# Patient Record
Sex: Male | Born: 1955
Health system: Southern US, Community
[De-identification: ages and names within clinical notes are randomized; demographics above are authoritative.]

## PROBLEM LIST (undated history)

## (undated) DIAGNOSIS — S43006A Unspecified dislocation of unspecified shoulder joint, initial encounter: Secondary | ICD-10-CM

## (undated) DIAGNOSIS — I10 Essential (primary) hypertension: Secondary | ICD-10-CM

## (undated) DIAGNOSIS — M069 Rheumatoid arthritis, unspecified: Secondary | ICD-10-CM

## (undated) DIAGNOSIS — R011 Cardiac murmur, unspecified: Secondary | ICD-10-CM

## (undated) DIAGNOSIS — J189 Pneumonia, unspecified organism: Secondary | ICD-10-CM

## (undated) DIAGNOSIS — J45909 Unspecified asthma, uncomplicated: Secondary | ICD-10-CM

## (undated) DIAGNOSIS — J302 Other seasonal allergic rhinitis: Secondary | ICD-10-CM

## (undated) DIAGNOSIS — F32A Depression, unspecified: Secondary | ICD-10-CM

## (undated) DIAGNOSIS — K219 Gastro-esophageal reflux disease without esophagitis: Secondary | ICD-10-CM

## (undated) DIAGNOSIS — J449 Chronic obstructive pulmonary disease, unspecified: Secondary | ICD-10-CM

## (undated) DIAGNOSIS — R0600 Dyspnea, unspecified: Secondary | ICD-10-CM

## (undated) DIAGNOSIS — F329 Major depressive disorder, single episode, unspecified: Secondary | ICD-10-CM

## (undated) DIAGNOSIS — K5792 Diverticulitis of intestine, part unspecified, without perforation or abscess without bleeding: Secondary | ICD-10-CM

## (undated) HISTORY — PX: TOTAL KNEE ARTHROPLASTY: SHX125

## (undated) HISTORY — PX: CARPAL TUNNEL RELEASE: SHX101

## (undated) HISTORY — PX: OTHER SURGICAL HISTORY: SHX169

---

## 2000-03-25 ENCOUNTER — Encounter: Payer: Self-pay | Admitting: *Deleted

## 2000-03-25 ENCOUNTER — Emergency Department (HOSPITAL_COMMUNITY): Admission: EM | Admit: 2000-03-25 | Discharge: 2000-03-25 | Payer: Self-pay | Admitting: *Deleted

## 2000-05-24 HISTORY — PX: TOTAL KNEE ARTHROPLASTY: SHX125

## 2003-04-10 ENCOUNTER — Inpatient Hospital Stay (HOSPITAL_COMMUNITY): Admission: RE | Admit: 2003-04-10 | Discharge: 2003-04-13 | Payer: Self-pay | Admitting: Orthopedic Surgery

## 2003-07-08 ENCOUNTER — Ambulatory Visit (HOSPITAL_BASED_OUTPATIENT_CLINIC_OR_DEPARTMENT_OTHER): Admission: RE | Admit: 2003-07-08 | Discharge: 2003-07-08 | Payer: Self-pay | Admitting: Orthopedic Surgery

## 2003-07-08 ENCOUNTER — Ambulatory Visit (HOSPITAL_COMMUNITY): Admission: RE | Admit: 2003-07-08 | Discharge: 2003-07-08 | Payer: Self-pay | Admitting: Orthopedic Surgery

## 2004-08-31 ENCOUNTER — Ambulatory Visit: Payer: Self-pay | Admitting: Internal Medicine

## 2004-09-16 ENCOUNTER — Ambulatory Visit: Payer: Self-pay

## 2009-02-03 ENCOUNTER — Inpatient Hospital Stay (HOSPITAL_COMMUNITY): Admission: RE | Admit: 2009-02-03 | Discharge: 2009-02-05 | Payer: Self-pay | Admitting: Orthopedic Surgery

## 2009-07-21 ENCOUNTER — Telehealth: Payer: Self-pay | Admitting: Family

## 2009-07-21 ENCOUNTER — Ambulatory Visit: Payer: Self-pay | Admitting: Diagnostic Radiology

## 2009-07-21 ENCOUNTER — Ambulatory Visit: Payer: Self-pay | Admitting: Family

## 2009-07-21 ENCOUNTER — Ambulatory Visit (HOSPITAL_BASED_OUTPATIENT_CLINIC_OR_DEPARTMENT_OTHER): Admission: RE | Admit: 2009-07-21 | Discharge: 2009-07-21 | Payer: Self-pay | Admitting: Internal Medicine

## 2009-07-21 DIAGNOSIS — J4540 Moderate persistent asthma, uncomplicated: Secondary | ICD-10-CM | POA: Insufficient documentation

## 2009-07-28 ENCOUNTER — Ambulatory Visit: Payer: Self-pay | Admitting: Family

## 2009-08-18 ENCOUNTER — Ambulatory Visit: Payer: Self-pay | Admitting: Diagnostic Radiology

## 2009-08-18 ENCOUNTER — Ambulatory Visit (HOSPITAL_BASED_OUTPATIENT_CLINIC_OR_DEPARTMENT_OTHER): Admission: RE | Admit: 2009-08-18 | Discharge: 2009-08-18 | Payer: Self-pay | Admitting: Internal Medicine

## 2009-10-30 ENCOUNTER — Telehealth: Payer: Self-pay | Admitting: Family

## 2009-11-11 ENCOUNTER — Ambulatory Visit: Payer: Self-pay | Admitting: Family

## 2009-11-11 DIAGNOSIS — R011 Cardiac murmur, unspecified: Secondary | ICD-10-CM | POA: Insufficient documentation

## 2009-11-11 DIAGNOSIS — L989 Disorder of the skin and subcutaneous tissue, unspecified: Secondary | ICD-10-CM | POA: Insufficient documentation

## 2009-11-11 DIAGNOSIS — I498 Other specified cardiac arrhythmias: Secondary | ICD-10-CM

## 2009-11-13 ENCOUNTER — Encounter: Payer: Self-pay | Admitting: Family

## 2009-11-13 DIAGNOSIS — M1990 Unspecified osteoarthritis, unspecified site: Secondary | ICD-10-CM | POA: Insufficient documentation

## 2009-11-14 ENCOUNTER — Encounter (INDEPENDENT_AMBULATORY_CARE_PROVIDER_SITE_OTHER): Payer: Self-pay | Admitting: *Deleted

## 2009-11-21 ENCOUNTER — Encounter: Payer: Self-pay | Admitting: Family

## 2009-12-08 ENCOUNTER — Encounter: Payer: Self-pay | Admitting: Family

## 2010-02-12 ENCOUNTER — Encounter: Payer: Self-pay | Admitting: Family

## 2010-02-25 ENCOUNTER — Telehealth: Payer: Self-pay | Admitting: Family

## 2010-02-25 DIAGNOSIS — I861 Scrotal varices: Secondary | ICD-10-CM | POA: Insufficient documentation

## 2010-02-25 DIAGNOSIS — N508 Other specified disorders of male genital organs: Secondary | ICD-10-CM | POA: Insufficient documentation

## 2010-02-27 ENCOUNTER — Telehealth: Payer: Self-pay | Admitting: Family

## 2010-04-10 ENCOUNTER — Telehealth: Payer: Self-pay | Admitting: Family

## 2010-06-13 ENCOUNTER — Encounter: Payer: Self-pay | Admitting: Internal Medicine

## 2010-06-23 NOTE — Miscellaneous (Signed)
Summary: Vaccine Record/[facility]  Vaccine Record/[facility]   Imported By: Lanelle Bal 12/08/2009 10:20:29  _____________________________________________________________________  External Attachment:    Type:   Image     Comment:   External Document

## 2010-06-23 NOTE — Miscellaneous (Signed)
Summary: Flu vaccine--2008  Clinical Lists Changes  Observations: Added new observation of FLU VAX: Historical (04/07/2007 10:53)      Immunization History:  Influenza Immunization History:    Influenza:  historical (04/07/2007)

## 2010-06-23 NOTE — Letter (Signed)
Summary: Generic Letter  [place] at [facility]  [address]   [place], [place] [address]   Phone: [phone]  Fax: [phone]    11/13/2009  Disability Determination Services [address] [place]. [address]  To Whom it may concern,  This letter is in reference to Mr. Austin Faulkner, DOB 2056/03/23.  Please see attached office note dated 11/11/2009 for complete history/examination, clinical findings, diagnosis, treatment/response and prognosis. He will return for fasting lab work.  We currently do not have any lab work on file. [name] suffers from a long history of degenerative joint disease in his knees and has had a total of 12 surgeries on his knees.  Both of his knees have been replaced, (most recently his left knee was replaced on 02/03/09).    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  He continues to have daily pain, swelling and stiffness in his knees which limits his ability to climb stairs, to stand or to walk for prolonged periods.  This physical impairment limits his ability to perform work-related sitting, standing, walking and lifting of objects.  He currently does not suffer from any signs of mental impairment.             Sincerely,   Sandford Craze FNP   Appended Document: Generic Letter Letter and 11/11/09 office noted mailed to pt per [facility] request.

## 2010-06-23 NOTE — Assessment & Plan Note (Signed)
Summary: follow up for his pneumonia--ph   Vital Signs:  Patient profile:   55 year old male Weight:      218.25 pounds BMI:     31.43 O2 Sat:      96 % on Room air Temp:     97.7 degrees F oral Pulse rate:   88 / minute Pulse rhythm:   regular Resp:     16 per minute BP sitting:   110 / 88  (right arm) Cuff size:   regular  Vitals Entered By: Mervin Kung CMA (July 28, 2009 8:41 AM)  O2 Flow:  Room air CC: room 17  follow up of pneumonia   CC:  room 17  follow up of pneumonia.  History of Present Illness: Austin Faulkner is a 55 year old male who presents today for follow up of his pneumonia.  Notes that he took his last dose of avelox yesterday.  Feeling better, denies fever, mucous which was originally yellow/green, but has now turned clear.  Still some SOB with exertion and does not feel that his energy has returned to normal.  No longer wheezing.  Only coughs if he is walking.    Allergies (verified): No Known Drug Allergies  Physical Exam  General:  Well-developed,well-nourished,in no acute distress; alert,appropriate and cooperative throughout examination Head:  Normocephalic and atraumatic without obvious abnormalities. No apparent alopecia or balding. Neck:  No deformities, masses, or tenderness noted. Lungs:  Normal respiratory effort, chest expands symmetrically. Lungs are clear to auscultation, no crackles or wheezes. Heart:  Normal rate and regular rhythm. S1 and S2 normal without gallop, murmur, click, rub or other extra sounds.   Impression & Recommendations:  Problem # 1:  PNEUMONIA, RIGHT (ICD-486) Assessment Improved Lung exam improved.  Clinically improved.  Completed antibiotics, afebrile.   Plan to repeat chest x-ray in 1 month to follow to clearing.  Pt instructed on follow up- see pt instructions.  Plan complete physical.   Clinically improved The following medications were removed from the medication list:    Avelox 400 Mg Tabs (Moxifloxacin hcl)  ..... One tablet by mouth daily x 7  Orders: CXR- 2view (CXR)  Complete Medication List: 1)  Asmanex 60 Metered Doses 220 Mcg/inh Aepb (Mometasone furoate) .... 2 puffs by mouth at bedtime 2)  Ibuprofen 800 Mg Tabs (Ibuprofen) .... Take 1 tablet every 8 hours as needed 3)  Hydrochlorothiazide 25 Mg Tabs (Hydrochlorothiazide) .... Take one by mouth every other day 4)  Omeprazole 20 Mg Cpdr (Omeprazole) .... Take 1 capsule by mouth once a day 5)  Loratadine 10 Mg Tabs (Loratadine) .... Take 1 tablet by mouth once a day 6)  Mucinex Dm Maximum Strength 60-1200 Mg Xr12h-tab (Dextromethorphan-guaifenesin) .... Take 1 tablet by mouth two times a day 7)  Proventil Hfa 108 (90 Base) Mcg/act Aers (Albuterol sulfate) .... 2 puffs as needed 8)  Daily Multi Tabs (Multiple vitamins-minerals) .... Take 1 tablet by mouth once a day  Patient Instructions: 1)  Please arrange a complete physical exam. 2)  Call if your energy or breathing does not continue to improve, if it worsens, or if you develop recurrent fever/chills. 3)  Complete follow up chest x-ray around March 28th.    Current Allergies (reviewed today): No known allergies

## 2010-06-23 NOTE — Progress Notes (Signed)
Summary: referral  Phone Note Outgoing Call   Call placed by: Lemont Fillers FNP,  April 10, 2010 4:31 PM Call placed to: Patient Summary of Call: Left message on patient's cell phone letting requesting call back.   Initial call taken by: Lemont Fillers FNP,  April 10, 2010 4:32 PM  Follow-up for Phone Call        Patient returned call, he will call back in afternoon, not available by phone Follow-up by: Darral Dash,  April 13, 2010 8:19 AM  Additional Follow-up for Phone Call Additional follow up Details #1::        Spoke with patient.  He will have ultrasound performed at the [facility] hospital during his upcoming apt.  Then will follow up with [place].  Requests that order be mailed to him, as he misplaced. Pls mail copy.  Additional Follow-up by: Lemont Fillers FNP,  April 13, 2010 1:26 PM    Additional Follow-up for Phone Call Additional follow up Details #2::    Copy has been mailed. Nicki Guadalajara Fergerson CMA Duncan Dull)  April 13, 2010 1:40 PM

## 2010-06-23 NOTE — Assessment & Plan Note (Signed)
Summary: CPX/DK   Vital Signs:  Patient profile:   55 year old male Height:      70 inches Weight:      213 pounds BMI:     30.67 O2 Sat:      98 % on Room air Temp:     98.1 degrees F oral Pulse rate:   52 / minute Pulse rhythm:   regular Resp:     18 per minute BP sitting:   108 / 70  (right arm) Cuff size:   large  Vitals Entered By: Glendell Docker CMA (November 11, 2009 9:53 AM)  O2 Flow:  Room air CC: Rm 4- Disability Evaluation Is Patient Diabetic? No Pain Assessment Patient in pain? no      Comments status post right knee replacement 01/2009, Paxil  40 once a day, medications reviewed   CC:  Rm 4- Disability Evaluation.  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  Patient notes that he injured his left knee 7/01 and right knee 05/1998.  Both knees have been replaced.  He reports that he filed for disability/workman's comp with each of these injuries.  Dr.  Thurston Hole replaced left knee 02/03/09.  Patient tried to return to work on limited duty, however he was told that they did not have any limited duty jobs available.  He notes that he continues with pain 6/10 in the mornings, improves with motrin and activity.  Feels limited in his ability to walk, used to play ping pong, difficulty climbing stairs.  Unable to stand or walk for prolonged periods. He also notes that his knee pain has negatively impacted his sex life.  Preventative- Rides bike 5 miles a day.  Diet is healthly has recently lost >10 pounds. He also follows at the [place].  Never had colonoscopy.    Preventive Screening-Counseling & Management  Alcohol-Tobacco     Alcohol drinks/day: <1     Smoking Status: never  Caffeine-Diet-Exercise     Caffeine use/day: 2 cups coffee daily     Does Patient Exercise: yes     Type of exercise: cycling     Times/week: 7  Allergies: No Known Drug Allergies  Past  History:  Past Medical History: DJD Asthma  Past Surgical History: 6 sugeries on right knee (scopes/and TKR) 6 surgeries on left knee  RTC repair left shoulder- 10/09/2002  Family History: Mom-deceased from lung cancer (smoker) died at age 22 Dad- living, healthy 2 sisters- livings- younger sister with HTN, OA, older sister alive and well 2 stepsons 1 biological daugher- alive and well, age 76  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 Resp: Denies cough, some ashtma symptoms with exercise CV:  Denies Chest Pain GI:  Denies nausea or vomitting or diarrhea GU: Denies dysuria  Lymphatic: Denies lymphadenopathy Musculoskeletal:  Bilateral knee pain Skin:  Denies Rashes, mole on back of right arm Psychiatric: Notes + history of depression, anxiety- improved with paxil- feels down at times due to his knee pain Neuro: notes some numbness on bilateral knees due to nerve damage  from his surgeries.     Physical Exam  General:  Well-developed,well-nourished,in no acute distress; alert,appropriate and cooperative throughout examination Head:  Normocephalic and atraumatic without obvious abnormalities. No apparent alopecia or balding. Eyes:  PERRLA Ears:  External ear exam shows no significant lesions or deformities.  Otoscopic examination reveals clear canals, tympanic membranes are intact bilaterally without bulging, retraction, inflammation or discharge. Hearing is grossly normal bilaterally. Mouth:  Oral mucosa and oropharynx without lesions or exudates.  Teeth in good repair. Neck:  No deformities, masses, or tenderness noted. Chest Wall:  No deformities, masses, tenderness or  gynecomastia noted. Lungs:  Normal respiratory effort, chest expands symmetrically. Lungs are clear to auscultation, no crackles or wheezes. Heart:  Normal rate and regular rhythm.  Grad 1-2 systolic murmur Abdomen:  Bowel sounds positive,abdomen soft and non-tender without masses, organomegaly or hernias noted. Rectal:  No external abnormalities noted. Normal sphincter tone. No rectal masses or tenderness. Genitalia:  + nodule at base of left testicle- firm Msk:  No deformity or scoliosis noted of thoracic or lumbar spine.   slight swelling of bilateral knees Neurologic:  No cranial nerve deficits noted. Station and gait are normal. Plantar reflexes are down-going bilaterally. DTRs are symmetrical throughout. Sensory, motor and coordinative functions appear intact. Skin:  Intact.  Hyperpigmented lesion noted on right upper arm.   Impression & Recommendations:  Problem # 1:  PREVENTIVE HEALTH CARE (ICD-V70.0) Assessment New Patient counseled on diet, exercise and weight loss.  Immunizations reviewed and up to date. EKG notes sinus bradycardia.  Will refer for colonoscopy. Patient reports that he recently had lab work at [place] completed which included PSA.  He has records at home and will provide [place] with copies. Patient comes today with paperwork regarding his disability claim. Orders: EKG w/ Interpretation (93[address]) Gastroenterology Referral (GI)  Problem # 2:  ASTHMA (ICD-493.90) Assessment: Deteriorated Not well controlled on Asmanex- will switch to Advair, continue proventil PRN His updated medication list for this problem includes:    Advair Diskus 250-50 Mcg/dose Aepb (Fluticasone-salmeterol) ..... One puff twice daily    Proventil Hfa 108 (90 Base) Mcg/act Aers (Albuterol sulfate) .Marland Kitchen... 2 puffs as needed  Problem # 3:  BRADYCARDIA (ICD-427.89) Assessment: Comment Only Asymptomatic.  EKG noted heart rate in the 40's.  Patient is not on a BB or rate contolling med.  No dizziness or  syncope.  Monitor.  Problem # 4:  SYSTOLIC MURMUR (ZOX-096.2) Assessment: Comment Only Patient reports that this is not new and that he has [facility] an [facility] [place]  Problem # 5:  SKIN LESION (ICD-709.9)  Orders: Dermatology Referral (Derma)  Complete Medication List: 1)  Advair Diskus 250-50 Mcg/dose Aepb (Fluticasone-salmeterol) .... One puff twice daily 2)  Ibuprofen 800 Mg Tabs (Ibuprofen) .... Take 1 tablet every 8 hours as needed 3)  Hydrochlorothiazide 25 Mg Tabs (Hydrochlorothiazide) .... Take one by mouth every other day as needed for swelling 4)  Omeprazole 20 Mg Cpdr (Omeprazole) .... Take 1 capsule by mouth once a day 5)  Loratadine 10 Mg Tabs (Loratadine) .... Take 1 tablet by mouth once a day 6)  Mucinex Dm Maximum Strength 60-1200 Mg Xr12h-tab (Dextromethorphan-guaifenesin) .... Take 1 tablet by mouth two times a day 7)  Proventil Hfa 108 (90 Base) Mcg/act Aers (Albuterol sulfate) .... 2 puffs as needed 8)  Daily Multi Tabs (Multiple vitamins-minerals) .... Take 1 tablet by mouth once a day  Patient Instructions: 1)  You will be contacted  about your referral to dermatology. 2)  Please obtain a copy of your most recent blood work and immunizations from the [place]. 3)  Stop asthmanex- start Advair. 4)  You will be called about your referral for your testicular ultrasound 5)  Please follow up in 3 months. Prescriptions: ADVAIR DISKUS 250-50 MCG/DOSE AEPB (FLUTICASONE-SALMETEROL) one puff twice daily  #1 x 3   Entered and Authorized by:   Austin Faulkner   Signed by:   Austin Faulkner on 11/11/2009   Method used:   Print then Give to Patient   RxID:   [id number]     Appended Document: CPX/DK     Allergies: No Known Drug Allergies   Impression & Recommendations:  Problem # 1:  DEGENERATIVE JOINT DISEASE (ICD-715.90) Assessment Unchanged Patient is limited in his ability to stand or walk for prolonged periods.   Stairs are difficult for him to climb.  Unable to kneel.  Has daily pain, stiffness and swelling of bilateral knees.  He is s/p bilateral knee replacements and continues to use NSAIDS for pain.  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   His updated medication list for this problem includes:    Ibuprofen 800 Mg Tabs (Ibuprofen) .Marland Kitchen... Take 1 tablet every 8 hours as needed  Complete Medication List: 1)  Advair Diskus 250-50 Mcg/dose Aepb (Fluticasone-salmeterol) .... One puff twice daily 2)  Ibuprofen 800 Mg Tabs (Ibuprofen) .... Take 1 tablet every 8 hours as needed 3)  Hydrochlorothiazide 25 Mg Tabs (Hydrochlorothiazide) .... Take one by mouth every other day as needed for swelling 4)  Omeprazole 20 Mg Cpdr (Omeprazole) .... Take 1 capsule by mouth once a day 5)  Loratadine 10 Mg Tabs (Loratadine) .... Take 1 tablet by mouth once a day 6)  Mucinex Dm Maximum Strength 60-1200 Mg Xr12h-tab (Dextromethorphan-guaifenesin) .... Take 1 tablet by mouth two times a day 7)  Proventil Hfa 108 (90 Base) Mcg/act Aers (Albuterol sulfate) .... 2 puffs as needed 8)  Daily Multi Tabs (Multiple vitamins-minerals) .... Take 1 tablet by mouth once a day

## 2010-06-23 NOTE — Progress Notes (Signed)
Summary: disability application--lm  Phone Note Outgoing Call   Summary of Call: Please call patient and let him know that I will need to see him in the office and get more information before I can consider recommending him for disability. He is due for a complete physical. Initial call taken by: Lemont Fillers FNP,  October 30, 2009 8:19 AM  Follow-up for Phone Call        Left message on machine to return my call.  Mervin Kung CMA  October 30, 2009 10:26 AM   Left message on machine to return my call.  Mervin Kung CMA  November 05, 2009 9:10 AM   Additional Follow-up for Phone Call Additional follow up Details #1::        call placed to patient at [phone], he has been advised per [facility] instructions. Appointment has been scheduled for 11/11/2009 @ 10a Additional Follow-up by: Glendell Docker CMA,  November 06, 2009 9:11 AM

## 2010-06-23 NOTE — Progress Notes (Signed)
  Phone Note Outgoing Call   Summary of Call: CXR notes RLL pneumonia- will treat with Avelox.  Called patient, informed him of results, instructed him to start abx tonight- continue inhaled steroid and albuterol (Q6 hours x next week).  Patient instructed to call if worsening symptoms, or shortness of breath.   Initial call taken by: Lemont Fillers FNP,  July 21, 2009 3:43 PM    New/Updated Medications: AVELOX 400 MG TABS (MOXIFLOXACIN HCL) one tablet by mouth daily x 7 days Prescriptions: AVELOX 400 MG TABS (MOXIFLOXACIN HCL) one tablet by mouth daily x 7 days  #7 x 0   Entered and Authorized by:   Lemont Fillers FNP   Signed by:   Lemont Fillers FNP on 07/21/2009   Method used:   Electronically to        [id number] (retail)       [address]       [place], [place]  [address]       Ph: [phone] or [phone]       Fax: [phone]   RxID:   [id number]

## 2010-06-23 NOTE — Letter (Signed)
Summary: Records Dated 12-05-97 thru 12-28-08/[facility]  Records Dated 12-05-97 thru 12-28-08/[facility]   Imported By: Lanelle Bal 12/08/2009 10:27:12  _____________________________________________________________________  External Attachment:    Type:   Image     Comment:   External Document

## 2010-06-23 NOTE — Miscellaneous (Signed)
Summary: immunization history  Clinical Lists Changes  Observations: Added new observation of PNEUMOVAX: Historical (04/07/2007 9:29) Added new observation of TD BOOSTER: Historical (05/24/1996 9:29)      Immunization History:  Tetanus/Td Immunization History:    Tetanus/Td:  historical (05/24/1996)  Pneumovax Immunization History:    Pneumovax:  historical (04/07/2007)

## 2010-06-23 NOTE — Progress Notes (Signed)
  Phone Note Outgoing Call      

## 2010-06-23 NOTE — Progress Notes (Signed)
  Phone Note Outgoing Call   Summary of Call: Reviewed patient's chart- testicular ultrasound was not completed.  Will order.  Message left on patient's cell phone informing him that ultrasound was to be scheduled and to call if he has any questions.  Initial call taken by: Lemont Fillers FNP,  February 25, 2010 11:08 AM  New Problems: TESTICULAR MASS, LEFT (ICD-608.89)   New Problems: TESTICULAR MASS, LEFT (ICD-608.89)   Preventive Care Screening     02/12/10 colonoscopy with Dr. Kinnie Scales (+polyp) path unavailable.  Appended Document:  Pt returned call and was notified that u/s had been arranged for today. Pt states he has all testing done at the [place] [facility]. and will be going back there in 2 weeks. Pt wants to wait until then to do the u/s. Wants to get order from [place] to take to the [place].  I advised pt he needed to have u/s before 2 weeks. Pt was questioning the urgency as he was last seen in June. Please advise.  Appended Document:  OK for patient to complete at the [place] in 2 weeks.   We will provide order for him.    Appended Document:  Left message for pt to return my call.  Appended Document:  Notified pt. He requests u/s order be mailed to his home. Order mailed.

## 2010-06-23 NOTE — Procedures (Signed)
Summary: Colonoscopic Snare Polypectomy/[facility]  Colonoscopic Snare Polypectomy/[facility]   Imported By: Lanelle Bal 03/03/2010 08:47:13  _____________________________________________________________________  External Attachment:    Type:   Image     Comment:   External Document

## 2010-06-23 NOTE — Progress Notes (Signed)
Summary: AVELOX NOT COVERED  Phone Note From Pharmacy   Caller: [facility] [address] [phone]* Summary of Call: Pharm called [place] office at 5:01pm: Avelox requires PA but Levaquin 750mg  is covered. Would prescriber change medication? Please inform pharmacy. Initial call taken by: Lamar Sprinkles, CMA,  July 21, 2009 5:52 PM  Follow-up for Phone Call        [facility] pharmacy and informed of med change.  Then called patient- he has already started avelox.  Will continue avelox.  Pharm notified not to fill levaquin  Follow-up by: Lemont Fillers FNP,  July 21, 2009 6:09 PM    New/Updated Medications: LEVAQUIN 750 MG TABS (LEVOFLOXACIN) one tablet by mouth daily x 5 days AVELOX 400 MG TABS (MOXIFLOXACIN HCL) one tablet by mouth daily x 7 Prescriptions: LEVAQUIN 750 MG TABS (LEVOFLOXACIN) one tablet by mouth daily x 5 days  #5 x 0   Entered and Authorized by:   Lemont Fillers FNP   Signed by:   Lemont Fillers FNP on 07/21/2009   Method used:   Electronically to        [facility] (retail)       [address]       [place], [place]  [address]       Ph: [phone] or [phone]       Fax: [phone]   RxID:   [id number]

## 2010-06-23 NOTE — Letter (Signed)
Summary: Primary Care Consult Scheduled Letter  [place] at [facility]  [address]   [place], [place] [address]   Phone: [phone]  Fax: [phone]      11/14/2009 MRN: [id number]  INDALECIO MALMSTROM [address], [place]  [address]    Dear Mr. VALLADOLID,      We have scheduled an appointment for you.  At the recommendation of  Melissa O'Sullivan,FNP, we have scheduled you a consult with [facility] , GARY ENGSTROM,PA  on AUGUST  12,2011 at 11:15AM.  Their address is__[address] ,[address], [place] . The office phone number is [phone].  If this appointment day and time is not convenient for you, please feel free to call the office of the doctor you are being referred to at the number listed above and reschedule the appointment.     It is important for you to keep your scheduled appointments. We are here to make sure you are given good patient care. If you have questions or you have made changes to your appointment, please notify us at  [phone], ask for HELEN.    Thank you, Darral Dash Patient Care Coordinator [place] at [facility]

## 2010-06-23 NOTE — Assessment & Plan Note (Signed)
Summary: ACUTE ONLY ASTHMA,LAST OV 2006 W/HOPPER/RH......   Vital Signs:  Patient profile:   55 year old male Height:      70 inches Weight:      212 pounds BMI:     30.53 O2 Sat:      98 % on Room air Temp:     98.3 degrees F oral Pulse rate:   64 / minute Pulse rhythm:   regular Resp:     16 per minute BP sitting:   120 / 84  (left arm) Cuff size:   regular  Vitals Entered By: Mervin Kung CMA (July 21, 2009 11:34 AM)  O2 Flow:  Room air CC: room 17  Asthma flare up   CC:  room 17  Asthma flare up.  History of Present Illness: Mr Yandell is a 55 year old male who presents today with c/o wheezing. Notes that 3-4 days ago developed mild loose stool, waves of nausea, and also some chest congestion.  Notes that these symptoms started two days ago.  It is associated with SOB, woke him up last night.  Notes some improvement with his albuterol.  Also notes + fatigue.  Patient notes low grade temp (sweats at night).  + cough- productive of light yellow mucous.    Allergies (verified): No Known Drug Allergies  Physical Exam  General:  Well-developed,well-nourished,in no acute distress; alert,appropriate and cooperative throughout examination Head:  Normocephalic and atraumatic without obvious abnormalities. No apparent alopecia or balding. Neck:  No deformities, masses, or tenderness noted. Lungs:  Soft R sided exp wheeze, RML and RLL crackles.  No increased WOB Heart:  Normal rate and regular rhythm. S1 and S2 normal without gallop, murmur, click, rub or other extra sounds.   Impression & Recommendations:  Problem # 1:  PNEUMONIA, RIGHT (ICD-486) Assessment New CXR notes right infrahilar air space disease.  Plan treatment with avelox, f/u 1 week, sooner if symptoms worsen His updated medication list for this problem includes:    Avelox 400 Mg Tabs (Moxifloxacin hcl) ..... One tablet by mouth daily x 7 days  Orders: T-2 View CXR (71020TC)  Problem # 2:  ASTHMA  (ICD-493.90) Assessment: Comment Only Patient with soft right sided expiratory wheeze. I am hesitant to add PO steroids in setting of acute PNA.  As wheezing is mild, and sats stable will continue inhaled steroid and standing albuterol for next 1 week.  Monitor.   His updated medication list for this problem includes:    Asmanex 60 Metered Doses 220 Mcg/inh Aepb (Mometasone furoate) .Marland Kitchen... 2 puffs by mouth at bedtime    Proventil Hfa 108 (90 Base) Mcg/act Aers (Albuterol sulfate) .Marland Kitchen... 2 puffs as needed  Complete Medication List: 1)  Asmanex 60 Metered Doses 220 Mcg/inh Aepb (Mometasone furoate) .... 2 puffs by mouth at bedtime 2)  Ibuprofen 800 Mg Tabs (Ibuprofen) .... Take 1 tablet every 8 hours as needed 3)  Hydrochlorothiazide 25 Mg Tabs (Hydrochlorothiazide) .... Take one by mouth every other day 4)  Omeprazole 20 Mg Cpdr (Omeprazole) .... Take 1 capsule by mouth once a day 5)  Loratadine 10 Mg Tabs (Loratadine) .... Take 1 tablet by mouth once a day 6)  Mucinex Dm Maximum Strength 60-1200 Mg Xr12h-tab (Dextromethorphan-guaifenesin) .... Take 1 tablet by mouth two times a day 7)  Proventil Hfa 108 (90 Base) Mcg/act Aers (Albuterol sulfate) .... 2 puffs as needed 8)  Daily Multi Tabs (Multiple vitamins-minerals) .... Take 1 tablet by mouth once a day 9)  Avelox 400 Mg Tabs (Moxifloxacin hcl) .... One tablet by mouth daily x 7 days  Patient Instructions: 1)  Your chest x-ray may be completed a the [place] office at [address] or at the [facility] in [place] at the corner of [address] and [address]. 2)  I will call you with your results 3)  Complete x ray this afternoon. 4)  Follow up in 1 week, sooner if increasing shortness of breath or fever over 101.  Go to ER if severe  Current Allergies (reviewed today): No known allergies

## 2010-07-03 ENCOUNTER — Ambulatory Visit (INDEPENDENT_AMBULATORY_CARE_PROVIDER_SITE_OTHER): Admitting: Family

## 2010-07-03 ENCOUNTER — Encounter: Payer: Self-pay | Admitting: Family

## 2010-07-03 DIAGNOSIS — L708 Other acne: Secondary | ICD-10-CM | POA: Insufficient documentation

## 2010-07-03 DIAGNOSIS — N508 Other specified disorders of male genital organs: Secondary | ICD-10-CM

## 2010-07-03 DIAGNOSIS — M549 Dorsalgia, unspecified: Secondary | ICD-10-CM | POA: Insufficient documentation

## 2010-07-09 NOTE — Assessment & Plan Note (Signed)
Summary: threw back out/ss--rm 5   Vital Signs:  Patient profile:   55 year old male Height:      70 inches Weight:      219.50 pounds BMI:     31.61 Temp:     98.3 degrees F oral Pulse rate:   66 / minute Pulse rhythm:   regular Resp:     16 per minute BP sitting:   140 / 94  (right arm) Cuff size:   large  Vitals Entered By: Mervin Kung CMA Duncan Dull) (July 03, 2010 11:07 AM) CC: Pt states he has had a catch in his back x 10day., Back Pain Is Patient Diabetic? No Pain Assessment Patient in pain? yes     Location: lower back Onset of pain  x 10 days, only hurts when he tries to stand from sitting position. Comments Pt no longer taking Mucinex.   CC:  Pt states he has had a catch in his back x 10day. and Back Pain.  History of Present Illness: Austin Faulkner is a 55 year old male who presents today with chief complaint of back pain. (see below)  2) Acne- Complains of facial acne, has tried noxema, clearasil without improvement.    3)Testicular ultrasound- has not completed at the [facility] as instructed.  Back Pain History:      The patient's back pain started approximately 06/24/2010.  The pain is located in the lower back region and does not radiate below the knees.  He states this is not work related.  On a scale of 1-10, he describes the pain as an 8.  He states that he has had a prior history of back pain.  The patient has not had any recent physical therapy for his back pain.  The following makes the back pain better: direct pressure.  The following makes the back pain worse: standing upright.        Description of injury in patient's own words:  Denies associated injury.        Other comments:  Has tried flexeril and motrin with some improvement.     Allergies (verified): No Known Drug Allergies  Past History:  Past Surgical History: Last updated: 11/11/2009 6 sugeries on right knee (scopes/and TKR) 6 surgeries on left knee  RTC repair left shoulder- 2004  Past  Medical History: DJD Asthma Pneumonia 07/21/09  Review of Systems       see HPI  Physical Exam  General:  Well-developed,well-nourished,in no acute distress; alert,appropriate and cooperative throughout examination Head:  Normocephalic and atraumatic without obvious abnormalities. No apparent alopecia or balding. Eyes:  PERRLA Neck:  No deformities, masses, or tenderness noted. Lungs:  Normal respiratory effort, chest expands symmetrically. Lungs are clear to auscultation, no crackles or wheezes. Heart:  Normal rate and regular rhythm. S1 and S2 normal without gallop, murmur, click, rub or other extra sounds. Neurologic:  strength normal in all extremities and gait normal.     Detailed Back/Spine Exam  Lumbosacral Exam:  Inspection-deformity:    Normal Palpation-spinal tenderness:  Normal    Noted to have several mobile non-tender massess most consistent with lipomas in the soft tissue of the lower back Lying Straight Leg Raise:    Right:  negative    Left:  negative   Impression & Recommendations:  Problem # 1:  BACK PAIN, ACUTE (ICD-724.5) Assessment New Will continue conservative measures (NSAIDS and as needed flexeril).  We did discuss risks of long-term NSAID use. Specifically risk of GI  bleed and kidney damage. Recommended that long-term he try to avoid NSAIDs and instead use Tylenol as needed. His updated medication list for this problem includes:    Ibuprofen 800 Mg Tabs (Ibuprofen) .Marland Kitchen... Take 1 tablet every 8 hours as needed    Flexeril 5 Mg Tabs (Cyclobenzaprine hcl) ..... One tab by mouth every 8 hours as needed for back spasm  Problem # 2:  TESTICULAR MASS, LEFT (ICD-608.89) Assessment: Comment Only the patient has been instructed on several occasions to complete this ultrasound. He has not done so. I offered to have this completed here at that center, but he refused as he prefers to do this at the [place]. He tells me that he still does have to order that we gave  him. Reminded patient of the importance of completing this to rule out any underlying malignancy. Informed him that delay in diagnosis could be dangerous to his health. He tells me that he will arrange within the next 2 weeks at the [place].  Problem # 3:  ACNE VULGARIS, FACIAL (ICD-706.1) Assessment: New we'll give patient trial of gel as below. His updated medication list for this problem includes:    Benzamycin 5-3 % Gel (Benzoyl peroxide-erythromycin) .Marland Kitchen... Apply to clean dry face once daily at bedtime  Complete Medication List: 1)  Advair Diskus 250-50 Mcg/dose Aepb (Fluticasone-salmeterol) .... One puff twice daily 2)  Ibuprofen 800 Mg Tabs (Ibuprofen) .... Take 1 tablet every 8 hours as needed 3)  Hydrochlorothiazide 25 Mg Tabs (Hydrochlorothiazide) .... Take one by mouth every other day as needed for swelling 4)  Omeprazole 20 Mg Cpdr (Omeprazole) .... Take 1 capsule by mouth once a day 5)  Loratadine 10 Mg Tabs (Loratadine) .... Take 1 tablet by mouth once a day 6)  Mucinex Dm Maximum Strength 60-1200 Mg Xr12h-tab (Dextromethorphan-guaifenesin) .... Take 1 tablet by mouth two times a day 7)  Proventil Hfa 108 (90 Base) Mcg/act Aers (Albuterol sulfate) .... 2 puffs as needed 8)  Daily Multi Tabs (Multiple vitamins-minerals) .... Take 1 tablet by mouth once a day 9)  Flexeril 5 Mg Tabs (Cyclobenzaprine hcl) .... One tab by mouth every 8 hours as needed for back spasm 10)  Amlodipine Besylate 5 Mg Tabs (Amlodipine besylate) .... One tablet by mouth once daily 11)  Benzamycin 5-3 % Gel (Benzoyl peroxide-erythromycin) .... Apply to clean dry face once daily at bedtime  Patient Instructions: 1)  Please arrange your testicular US ASAP at the [place].   2)  You may continue ibuprofen for the next 1 week, then try to use Tylenol 650mg  by mouth every 6 hours as needed for pain instead. 3)  Follow up in 1 month. Prescriptions: BENZAMYCIN 5-3 % GEL (BENZOYL PEROXIDE-ERYTHROMYCIN) Apply to clean dry face  once daily at bedtime  #1 x 1   Entered and Authorized by:   Lemont Fillers FNP   Signed by:   Lemont Fillers FNP on 07/03/2010   Method used:   Electronically to        [facility] (retail)       [address]       [place], [place]  [address]       Ph: [phone] or [phone]       Fax: [phone]   RxID:   [id number] AMLODIPINE BESYLATE 5 MG TABS (AMLODIPINE BESYLATE) one tablet by mouth once daily  #30 x 1   Entered and  Authorized by:   Lemont Fillers FNP   Signed by:   Lemont Fillers FNP on 07/03/2010   Method used:   Electronically to        [facility] (retail)       [address]       [place], [place]  [address]       Ph: [phone] or [phone]       Fax: [phone]   RxID:   [id number] FLEXERIL 5 MG TABS (CYCLOBENZAPRINE HCL) one tab by mouth every 8 hours as needed for back spasm  #30 x 0   Entered and Authorized by:   Lemont Fillers FNP   Signed by:   Lemont Fillers FNP on 07/03/2010   Method used:   Electronically to        [facility] (retail)       [address]       [place], [place]  [address]       Ph: [phone] or [phone]       Fax: [phone]   RxID:   [id number]    Orders Added: 1)  Est. Patient Level III [31517]    Current Allergies (reviewed today): No known allergies

## 2010-07-28 ENCOUNTER — Telehealth: Payer: Self-pay | Admitting: Family

## 2010-07-29 ENCOUNTER — Ambulatory Visit: Admitting: Family

## 2010-08-04 NOTE — Progress Notes (Signed)
Summary: appt  Phone Note Call from Patient   Caller: Patient Call For: Lemont Fillers FNP Summary of Call: pt canceled appt for tomorrow with [facility] but he wanted me to tell her that he is fine and he is following up with the [place] in april.  Initial call taken by: Elba Barman,  July 28, 2010 10:48 AM

## 2010-08-28 LAB — BASIC METABOLIC PANEL
BUN: 5 mg/dL — ABNORMAL LOW (ref 6–23)
CO2: 31 mEq/L (ref 19–32)
Calcium: 8.2 mg/dL — ABNORMAL LOW (ref 8.4–10.5)
Calcium: 8.5 mg/dL (ref 8.4–10.5)
Chloride: 96 mEq/L (ref 96–112)
Creatinine, Ser: 1.03 mg/dL (ref 0.4–1.5)
Creatinine, Ser: 1.04 mg/dL (ref 0.4–1.5)
GFR calc Af Amer: >60 mL/min (ref >60)

## 2010-08-28 LAB — URINALYSIS, ROUTINE W REFLEX MICROSCOPIC
Bilirubin Urine: NEGATIVE
Nitrite: NEGATIVE
Specific Gravity, Urine: 1.023 (ref 1.005–1.030)
Urobilinogen, UA: 0.2 mg/dL (ref 0.0–1.0)
pH: 5.5 (ref 5.0–8.0)

## 2010-08-28 LAB — CBC
HCT: 43.2 % (ref 39.0–52.0)
MCHC: 32.9 g/dL (ref 30.0–36.0)
MCHC: 33.7 g/dL (ref 30.0–36.0)
MCV: 93 fL (ref 78.0–100.0)
MCV: 93.5 fL (ref 78.0–100.0)
Platelets: 268 10*3/uL (ref 150–400)
Platelets: 327 10*3/uL (ref 150–400)
RBC: 3.84 MIL/uL — ABNORMAL LOW (ref 4.22–5.81)
RDW: 13.5 % (ref 11.5–15.5)
WBC: 13.8 10*3/uL — ABNORMAL HIGH (ref 4.0–10.5)
WBC: 14.6 10*3/uL — ABNORMAL HIGH (ref 4.0–10.5)

## 2010-08-28 LAB — DIFFERENTIAL
Basophils Absolute: 0 10*3/uL (ref 0.0–0.1)
Lymphocytes Relative: 19 % (ref 12–46)
Lymphs Abs: 2.1 10*3/uL (ref 0.7–4.0)
Monocytes Absolute: 0.9 10*3/uL (ref 0.1–1.0)
Monocytes Relative: 8 % (ref 3–12)
Neutro Abs: 8 10*3/uL — ABNORMAL HIGH (ref 1.7–7.7)

## 2010-08-28 LAB — URINE CULTURE: Colony Count: NO GROWTH

## 2010-08-28 LAB — TYPE AND SCREEN
ABO/RH(D): B POS
Antibody Screen: NEGATIVE

## 2010-08-28 LAB — COMPREHENSIVE METABOLIC PANEL
AST: 23 U/L (ref 0–37)
Albumin: 4.3 g/dL (ref 3.5–5.2)
BUN: 15 mg/dL (ref 6–23)
Calcium: 9.2 mg/dL (ref 8.4–10.5)
Creatinine, Ser: 1.1 mg/dL (ref 0.4–1.5)
GFR calc Af Amer: >60 mL/min (ref >60)
Total Bilirubin: 0.8 mg/dL (ref 0.3–1.2)
Total Protein: 7 g/dL (ref 6.0–8.3)

## 2010-08-28 LAB — APTT: aPTT: 26 seconds (ref 24–37)

## 2010-08-28 LAB — ABO/RH: ABO/RH(D): B POS

## 2010-10-09 NOTE — Op Note (Signed)
NAME:  Austin Faulkner, Austin Faulkner                        ACCOUNT NO.:  [id number]   MEDICAL RECORD NO.:  [id number]                   PATIENT TYPE:  INP   LOCATION:  5014                                 FACILITY:  [facility]   PHYSICIAN:  Elana Alm. Thurston Hole, M.D.              DATE OF BIRTH:  1956-01-02   DATE OF PROCEDURE:  04/10/2003  DATE OF DISCHARGE:                                 OPERATIVE REPORT   PREOPERATIVE DIAGNOSIS:  Posttraumatic degenerative joint disease.   POSTOPERATIVE DIAGNOSIS:  Posttraumatic degenerative joint disease.   OPERATION PERFORMED:  1. Right total knee replacement using [employer]ics Scorpio total knee system     with a #11 cemented femoral component, #9 cemented tibial component  with     12 mm polyethylene flex tibial spacer and 26 mm polyethylene cemented     patella.  2. Right knee lateral retinacular release.   SURGEON:  Elana Alm. Thurston Hole, M.D.   ASSISTANT:  Austin Faulkner, P.A.   ANESTHESIA:  General.   OPERATIVE TIME:  One hour and 30 minutes.   COMPLICATIONS:  None.   DESCRIPTION OF PROCEDURE:  Austin Faulkner was brought to the operating room on  April 10, 2003, placed on the operative table in supine position.  After  an adequate level of general anesthesia was obtained, his right knee was  examined under anesthesia.  Range of motion from -10 to 125 degrees with  significant varus deformity.  Knee stable to ligamentous exam with normal  patellar tracking. He had a Foley catheter placed under sterile conditions  and received Ancef 1g IV preoperatively for prophylaxis.  The right leg was  then prepped using sterile DuraPrep and draped using sterile technique.  The  leg was exsanguinated and a thigh tourniquet elevated 350 mm.  Initially,  through a 10 to 15 cm longitudinal incision based over the patella, initial  exposure was made.  The underlying subcutaneous tissues were incised in line  with the skin incision.  A median arthrotomy was performed  revealing an  excessive amount of normal-appearing joint fluid.  The articular surfaces  were inspected.  He had 30% grade 4 changes on both the medial and lateral  compartments and the rest grade 3 changes.  The patellofemoral joint showed  50 to 75% grade 3 chondromalacia.  Osteophytes were removed from the femoral  condyles and tibial plateaus.  The medial and lateral meniscal remnants were  removed as well as the anterior cruciate ligament.  An intramedullary drill  was then drilled up the femoral canal for placement of the distal femoral  cutting jig which was placed in the appropriate amount of rotation and a  distal 12 mm cut was made.  The distal femur was then sized and a #11 was  found to be the appropriate size and the #11 cutting jig was placed and then  these cuts were made.  The proximal tibia was then exposed.  The tibial  spines were removed with an oscillating saw.  Intramedullary drill, drilled  down the tibial canal for placement of the proximal tibial cutting jig which  was placed in the appropriate amount of rotation and a proximal 10 mm cut  was made.  After this was done the Scorpio PCL cutter was placed back on the  distal femur and these cuts were made.  After this was done, the #11 femoral  trial was placed.  A #9 tibial baseplate trial  was placed and with a 12 mm  polyethylene flexed tibial spacer, there was found to be excellent  restoration of normal alignment and excellent stability through full range  of motion.  The tibial base plate was then marked for rotation and the keel  cut was made.  After this was done, the patella was sized.  A 26 mm was  found to be the appropriate size and a recessed 10 mm x 26 mm cut was made  and three locking holes were placed.  After this was done it was felt that  all of the trial components were of excellent size, fit and stability.  The  jig was then removed and the knee was jet lavage irrigated with 3L of saline  solution  and then the proximal tibia was exposed and the #9 tibial base  plate with cement backing was hammered into position with an excellent fit  with excess cement being removed from around the edges.  The #11 femoral  component with cement backing was hammered into position also with an  excellent fit with excess cement being removed from around the edges.  The  12 mm polyethylene flexed tibial spacer was then locked on the tibial base  plate, the knee taken through a range of motion, 0 to 125 degrees with  excellent stability and no lift off of the baseplate and excellent  correction of the varus malalignment.  The 26 mm polyethylene cement backed  patella was then placed into its recessed  hole and locked in place with a  clamp.  After the cement hardened, patellofemoral tracking was evaluated.  There was still some significant lateral tracking and tightness and thus a  lateral retinacular release was carried out.  This significantly improved  patellar tracking to normal and decompressed the patellofemoral joint.  After this was done, it was felt that all the trial components were of  excellent size and stability.  The knee was further irrigated with  antibiotic solution and then the arthrotomy was closed with #1 Ethibond  suture over two medium Hemovac drains.  Subcutaneous tissues closed with 0  and 2-0 Vicryl.  Skin closed with skin staples.  Sterile dressings were  applied.  Hemovac injected with 0.25% Marcaine with epinephrine and clamped.  Tourniquet was released.  The patient then had a femoral nerve block placed  by anesthesia for postoperative pain control.  He was then awakened,  extubated and taken to recovery room in stable condition.  Sponge and needle  counts were correct times two at the end of this case.                                               Robert A. Thurston Hole, M.D.    RAW/MEDQ  D:  04/10/2003  T:  04/11/2003  Job:  [id number]

## 2010-10-09 NOTE — Discharge Summary (Signed)
NAME:  Austin Faulkner, Austin Faulkner                        ACCOUNT NO.:  [id number]   MEDICAL RECORD NO.:  [id number]                   PATIENT TYPE:  INP   LOCATION:  5014                                 FACILITY:  [facility]   PHYSICIAN:  Elana Alm. Thurston Hole, M.D.              DATE OF BIRTH:  1955-10-24   DATE OF ADMISSION:  04/10/2003  DATE OF DISCHARGE:  04/13/2003                                 DISCHARGE SUMMARY   ADMISSION DIAGNOSES:  1. End-stage degenerative joint disease right knee.  2. Posttraumatic degenerative joint disease right knee.  3. Asthma.  4. Reflux.   DISCHARGE DIAGNOSES:  1. End-stage degenerative joint disease right knee.  2. Posttraumatic degenerative joint disease right knee.  3. Asthma.  4. Reflux.   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  He has undergone an ACL  reconstruction as well as scopes on this knee.  Despite this, he continues  to have significant pain unrelieved by rest, anti-inflammatories, or  cortisone injections.  He understands the risks, benefits, and possible  complications of a right total knee replacement and is without question.   PROCEDURES IN-HOUSE:  On April 10, 2003 the patient underwent a right  total knee replacement by Molly Maduro A. Thurston Hole, M.D.  Postoperatively he  underwent a femoral nerve block by anesthesia.  Postop day #1 he was doing  well, vital signs were stable.  He had difficulty with pain control.  He was  taking Percocet and his PCA.  On postop day #1 his PCA was discontinued.  He  was started on OxyContin 20 mg one p.o. b.i.d. as well as OxyIR for  breakthrough pain.  Postop day #2 the patient continued to improve.  Pain  was under control with p.o. pain medicines.  He spiked a temp of 101.  Hemoglobin was 11.2, INR was 2.1.  Surgical wound was well approximated.  He  progressed very well with physical therapy.  Postop day #3 the patient was  afebrile and his hemoglobin was 10.7.  He was alert and oriented.  He was  metabolically stable.  He was ready to be discharged.  He was discharged to  home in stable condition with home health physical therapy, home health  nursing, a 3-in-one bedside commode, a tub bench, walker with wheels, a CPM  machine.  Home health nursing for Coumadin monitoring.  We will see him back  in the office two weeks from his surgery date.  He has been instructed to  call if he has increased pain, increased drainage, increased redness.  He is  weightbearing as tolerated, in stable condition, on a regular diet.      Kirstin Shepperson, P.A.                  Robert A. Thurston Hole, M.D.    KS/MEDQ  D:  05/01/2003  T:  05/01/2003  Job:  [id number]

## 2010-10-09 NOTE — Op Note (Signed)
NAME:  Austin Faulkner, Austin Faulkner                        ACCOUNT NO.:  [id number]   MEDICAL RECORD NO.:  [id number]                   PATIENT TYPE:  AMB   LOCATION:  DSC                                  FACILITY:  [facility]   PHYSICIAN:  Robert A. Thurston Hole, M.D.              DATE OF BIRTH:  Jan 23, 1956   DATE OF PROCEDURE:  07/08/2003  DATE OF DISCHARGE:                                 OPERATIVE REPORT   PREOPERATIVE DIAGNOSIS:  Right knee arthrofibrosis status post total knee  replacement.   POSTOPERATIVE DIAGNOSIS:  Right knee arthrofibrosis status post total knee  replacement.   PROCEDURE:  Right knee examination under anesthesia followed by manipulation  and cortisone injection.   SURGEON:  Elana Alm. Thurston Hole, M.D.   ASSISTANT:  Julien Girt, P.A.   ANESTHESIA:  General.   OPERATIVE TIME:  5 minutes.   COMPLICATIONS:  None.   INDICATIONS:  Austin Faulkner is as 55 year old gentleman who underwent a total  knee replacement three months ago.  He has had persistent pain with  arthrofibrosis that has not responded to conservative care and is now to  undergo manipulation and injection.   DESCRIPTION OF PROCEDURE:  Austin Faulkner was brought to the operating room on  July 08, 2003, after a femoral nerve block had been placed in the  holding room.  He was placed on the operating table in the supine position.  After being placed under general anesthesia.  Initial range of motion showed  0 to 90 degrees.  Gentle manipulation was carried out breaking up adhesions  and approving flexion to 125 degrees.  The knee remained stable ligamentous  exam with normal patella tracking.  After this was done then the knee was  sterilely injected with 80 mg of Depo-Medrol and 10 mL of 0.25% Marcaine  with epinephrine.  He also received Ancef 1 g IV preoperatively for  prophylaxis.  He was then awakened and taken to recovery in stable  condition.   FOLLOWUP CARE:  Austin Faulkner will be followed as an outpatient  Percocet for  pain and a CPM machine.  See him back in the office in one week for recheck  and followup with early aggressive physical therapy.                                               Robert A. Thurston Hole, M.D.    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

## 2010-11-30 ENCOUNTER — Telehealth: Payer: Self-pay | Admitting: *Deleted

## 2010-11-30 ENCOUNTER — Other Ambulatory Visit: Payer: Self-pay | Admitting: Family

## 2010-11-30 ENCOUNTER — Other Ambulatory Visit (HOSPITAL_BASED_OUTPATIENT_CLINIC_OR_DEPARTMENT_OTHER)

## 2010-11-30 DIAGNOSIS — N5089 Other specified disorders of the male genital organs: Secondary | ICD-10-CM

## 2010-11-30 NOTE — Telephone Encounter (Signed)
Pls let patient know that his [place] has been reordered.  Provide patient with number for imaging dept. He should be able to call them and set up appointment time.

## 2010-11-30 NOTE — Telephone Encounter (Signed)
Received call from pt stating he had contacted the [facility] Re: the need to have a testicular ultrasound that he was supposed to have for monitoring purposes. He states they sent [place] a letter of approval to have the ultrasound performed here at the [facility] and he just needs [place] to schedule the procedure. Advised pt that the approval from the [facility] Expired on 11/27/10 and he states that he spoke to them and they told him he did not need to worry about the expiration date, he could still schedule procedure and call them with a date/time. Please advise what we need to schedule for pt?

## 2010-11-30 NOTE — Telephone Encounter (Signed)
Contacted pt and provided him with number to imaging.

## 2010-12-02 ENCOUNTER — Ambulatory Visit (INDEPENDENT_AMBULATORY_CARE_PROVIDER_SITE_OTHER)
Admission: RE | Admit: 2010-12-02 | Discharge: 2010-12-02 | Disposition: A | Discharge status: Home / Self Care | Payer: Non-veteran care | Source: Ambulatory Visit | Attending: Family | Admitting: Family

## 2010-12-02 ENCOUNTER — Ambulatory Visit (HOSPITAL_BASED_OUTPATIENT_CLINIC_OR_DEPARTMENT_OTHER)
Admission: RE | Admit: 2010-12-02 | Discharge: 2010-12-02 | Disposition: A | Discharge status: Home / Self Care | Payer: Non-veteran care | Source: Ambulatory Visit | Attending: Family | Admitting: Family

## 2010-12-02 DIAGNOSIS — I861 Scrotal varices: Secondary | ICD-10-CM

## 2010-12-02 DIAGNOSIS — N5089 Other specified disorders of the male genital organs: Secondary | ICD-10-CM

## 2010-12-02 DIAGNOSIS — N508 Other specified disorders of male genital organs: Secondary | ICD-10-CM | POA: Insufficient documentation

## 2010-12-22 ENCOUNTER — Other Ambulatory Visit: Payer: Self-pay | Admitting: Family

## 2010-12-22 NOTE — Telephone Encounter (Signed)
Needs OV prior to refills pls.

## 2010-12-22 NOTE — Telephone Encounter (Signed)
Please advise if ok to refill Benzamycin gel and if so, number of refills.

## 2010-12-24 NOTE — Telephone Encounter (Signed)
Left message on machine to return my call. 

## 2010-12-24 NOTE — Telephone Encounter (Signed)
Advised pt of Melissa's instruction and pt states he has an appt with his dermatologist on 01/19/11 and will try to wait until then to get med refilled. States he will forward [place] a copy of his office visit from the [place] to put in his file once he gets a copy. Declined to scheduled appt at this time. Pt requested that I mail him a copy of his testicular u/s results. Copy mailed.

## 2011-01-18 ENCOUNTER — Telehealth: Payer: Self-pay | Admitting: *Deleted

## 2011-01-18 NOTE — Telephone Encounter (Signed)
Pt left message requesting copy of paperwork from the [place] authorizing a testicular ultrasound in July. Papers printed and left at front desk for pt to pick up. Left message on voicemail and to call if any questions.

## 2011-02-26 ENCOUNTER — Telehealth: Payer: Self-pay | Admitting: Family

## 2011-02-26 DIAGNOSIS — L709 Acne, unspecified: Secondary | ICD-10-CM

## 2011-02-26 NOTE — Telephone Encounter (Signed)
Patient states that he would like to be referred to a dermatologist.

## 2011-03-01 NOTE — Telephone Encounter (Signed)
Spoke to pt on Friday. He left information packet re: authorization from [place] to see a local dermatologist. Authorization dates are good from 02/09/11 through 05/24/11 per the [place]. Pt states he sees a dermatologist for bumps on his face. Please advise.

## 2011-03-01 NOTE — Telephone Encounter (Signed)
Patient called again for referral to dermatology. Patient states that he is on a deadline from the [facility] for referral.

## 2011-03-01 NOTE — Telephone Encounter (Signed)
Pls call pt and let him know that I have placed referral order for dermatology. He was due for follow up back in March.  Please have pt schedule a follow up visit.

## 2011-03-01 NOTE — Telephone Encounter (Signed)
Pt notified and scheduled f/u for 03/08/11 at 9:30am.

## 2011-03-08 ENCOUNTER — Ambulatory Visit: Payer: Non-veteran care | Admitting: Family

## 2012-02-11 ENCOUNTER — Telehealth: Payer: Self-pay | Admitting: Family

## 2012-02-11 NOTE — Telephone Encounter (Signed)
Called pt, reports 5-6 stools loose stools today.  Small amount of blood on tissue.  Recommended that he stay well hydrated, may use imodium prn.  Instructed pt to go to ER if worsening diarrhea or rectal bleeding worsens to the point that the toilet bowel is turning red.  Otherwise, instructed pt for follow up appointment next week. Pt verbalizes understanding.

## 2012-02-11 NOTE — Telephone Encounter (Signed)
Patient states that he has been to the bathroom about 7 times in the last 24 hrs. His stomach has been upset and when he wipes there is blood on the tissue. He would like to know if he should wait for an appointment for next week or go to Urgent Care.

## 2012-02-14 ENCOUNTER — Encounter: Payer: Self-pay | Admitting: Family

## 2012-02-14 ENCOUNTER — Ambulatory Visit (INDEPENDENT_AMBULATORY_CARE_PROVIDER_SITE_OTHER): Payer: Medicare Other | Admitting: Family

## 2012-02-14 VITALS — BP 140/86 | HR 55 | Temp 98.1°F | Resp 16 | Wt 216.1 lb

## 2012-02-14 DIAGNOSIS — K644 Residual hemorrhoidal skin tags: Secondary | ICD-10-CM

## 2012-02-14 DIAGNOSIS — Z23 Encounter for immunization: Secondary | ICD-10-CM

## 2012-02-14 DIAGNOSIS — K625 Hemorrhage of anus and rectum: Secondary | ICD-10-CM

## 2012-02-14 DIAGNOSIS — K219 Gastro-esophageal reflux disease without esophagitis: Secondary | ICD-10-CM

## 2012-02-14 DIAGNOSIS — I10 Essential (primary) hypertension: Secondary | ICD-10-CM

## 2012-02-14 LAB — BASIC METABOLIC PANEL
CO2: 28 mEq/L (ref 19–32)
Chloride: 108 mEq/L (ref 96–112)
Glucose, Bld: 84 mg/dL (ref 70–99)
Potassium: 4.3 mEq/L (ref 3.5–5.3)
Sodium: 140 mEq/L (ref 135–145)

## 2012-02-14 MED ORDER — ACETAMINOPHEN 325 MG PO TABS: 650.0000 mg | ORAL_TABLET | Freq: Four times a day (QID) | ORAL | Status: DC | PRN

## 2012-02-14 NOTE — Progress Notes (Signed)
  Subjective:    Patient ID: Austin Faulkner, male    DOB: 10/31/1955, 56 y.o.   MRN: [id number]  HPI  [name] is a 56 yr old male who presents today with chief complaint of rectal bleeding.  He noted rectal bleeding on Friday on the toilet tissue after he had several bouts of diarrhea.  He used imodium, drank lots of fluids. No BM sat or Sunday. Normal BM today, no further rectal bleeding.  Last colo was 2011.  Reports + hemorrhoid.    He also reports + GI upset recently.  He continues daily ibuprofen for knee pain.   Review of Systems See HPI  No past medical history on file.  History   Social History  . Marital Status: Married    Spouse Name: N/A    Number of Children: N/A  . Years of Education: N/A   Occupational History  . Not on file.   Social History Main Topics  . Smoking status: Passive Smoke Exposure - Never Smoker  . Smokeless tobacco: Never Used  . Alcohol Use: Not on file  . Drug Use: Not on file  . Sexually Active: Not on file   Other Topics Concern  . Not on file   Social History Narrative  . No narrative on file    No past surgical history on file.  No family history on file.  No Known Allergies  Current Outpatient Prescriptions on File Prior to Visit  Medication Sig Dispense Refill  . albuterol (PROVENTIL HFA) 108 (90 BASE) MCG/ACT inhaler Inhale 2 puffs into the lungs every 6 (six) hours as needed.      . budesonide-formoterol (SYMBICORT) 80-4.5 MCG/ACT inhaler Inhale 2 puffs into the lungs every morning.      . hydrochlorothiazide (HYDRODIURIL) 25 MG tablet Take 25 mg by mouth daily.      Marland Kitchen loratadine (CLARITIN) 10 MG tablet Take 10 mg by mouth daily as needed.      Marland Kitchen omeprazole (PRILOSEC) 20 MG capsule Take 20 mg by mouth 2 (two) times daily.        BP 140/86  Pulse 55  Temp 98.1 F (36.7 C) (Oral)  Resp 16  Wt 216 lb 1.3 oz (98.013 kg)  SpO2 99%       Objective:   Physical Exam  Constitutional: He appears well-developed and  well-nourished. No distress.  Cardiovascular: Normal rate and regular rhythm.   No murmur heard. Pulmonary/Chest: Effort normal and breath sounds normal. No respiratory distress. He has no wheezes. He has no rales. He exhibits no tenderness.  Genitourinary: Prostate normal.       + external hemorrhoid.  Heme neg stool.    Psychiatric: He has a normal mood and affect. His behavior is normal. Judgment and thought content normal.          Assessment & Plan:

## 2012-02-14 NOTE — Assessment & Plan Note (Signed)
Recent bleeding- now resolved.  Obtain CBC. Recommended that the patient eat a high fiber diet, drink plenty of fluids.  Preparation H and sitz baths prn.  Pt verbalizes understanding.

## 2012-02-14 NOTE — Patient Instructions (Addendum)
Please complete your blood work prior to leaving. Call if recurrent bleeding. Please schedule a follow up appointment in 3 months.

## 2012-02-14 NOTE — Assessment & Plan Note (Signed)
Continue PPI.  Recommended that he discontinue ibuprofen and try to switch to tylenol prn joint pain as I suspect that chronic NSAID use is contributing to his GI upset.

## 2012-02-15 ENCOUNTER — Encounter: Payer: Self-pay | Admitting: Family

## 2012-02-21 ENCOUNTER — Telehealth: Payer: Self-pay | Admitting: *Deleted

## 2012-02-21 DIAGNOSIS — K625 Hemorrhage of anus and rectum: Secondary | ICD-10-CM

## 2012-02-21 NOTE — Telephone Encounter (Signed)
Could you pls ask patient if he could return to lab for CBC?

## 2012-02-21 NOTE — Telephone Encounter (Signed)
Lab states they have not received lavender tube for the CBC ordered. Phlebotomist states she did not receive requisition for the CBC. Only documented that SST was drawn. CBC was on a requisition by itself.  Please advise.

## 2012-02-21 NOTE — Telephone Encounter (Signed)
Message copied by Kathi Simpers on Mon Feb 21, 2012  9:09 AM ------      Message from: [place], MELISSA      Created: Sun Feb 20, 2012  9:07 PM       Could  You pls check status of cbc from 9/23? thanks

## 2012-02-22 NOTE — Telephone Encounter (Signed)
Notified pt and he will return tomorrow morning. Order entered and given to the lab.

## 2012-02-23 ENCOUNTER — Encounter: Payer: Self-pay | Admitting: Family

## 2012-02-23 LAB — CBC WITH DIFFERENTIAL/PLATELET
Hemoglobin: 14.1 g/dL (ref 13.0–17.0)
Lymphocytes Relative: 26 % (ref 12–46)
Lymphs Abs: 2.6 10*3/uL (ref 0.7–4.0)
MCH: 29.9 pg (ref 26.0–34.0)
Monocytes Relative: 7 % (ref 3–12)
Neutro Abs: 6.4 10*3/uL (ref 1.7–7.7)
Neutrophils Relative %: 65 % (ref 43–77)
RBC: 4.72 MIL/uL (ref 4.22–5.81)
WBC: 9.8 10*3/uL (ref 4.0–10.5)

## 2012-02-23 NOTE — Addendum Note (Signed)
Addended by: Mervin Kung A on: 02/23/2012 10:10 AM   Modules accepted: Orders

## 2012-02-23 NOTE — Telephone Encounter (Signed)
Pt presented to the lab, future order released. 

## 2012-03-13 ENCOUNTER — Telehealth: Payer: Self-pay | Admitting: *Deleted

## 2012-03-13 NOTE — Telephone Encounter (Signed)
Pt left message last week requesting most recent blood count results. Also wanted to know what he had done at his 06/2010 office visit. Spoke with pt and he reports that he never received results from his last CBC. Reminded pt of reason for visit at 06/2010 office visit and mailed copy of CBC and lab letter again.

## 2013-07-13 ENCOUNTER — Emergency Department (HOSPITAL_BASED_OUTPATIENT_CLINIC_OR_DEPARTMENT_OTHER)
Admission: EM | Admit: 2013-07-13 | Discharge: 2013-07-13 | Disposition: A | Discharge status: Home / Self Care | Payer: Medicare Other | Attending: Emergency Medicine | Admitting: Emergency Medicine

## 2013-07-13 ENCOUNTER — Emergency Department (HOSPITAL_BASED_OUTPATIENT_CLINIC_OR_DEPARTMENT_OTHER): Payer: Medicare Other

## 2013-07-13 ENCOUNTER — Encounter (HOSPITAL_BASED_OUTPATIENT_CLINIC_OR_DEPARTMENT_OTHER): Payer: Self-pay | Admitting: Emergency Medicine

## 2013-07-13 DIAGNOSIS — Y9389 Activity, other specified: Secondary | ICD-10-CM | POA: Insufficient documentation

## 2013-07-13 DIAGNOSIS — S5002XA Contusion of left elbow, initial encounter: Secondary | ICD-10-CM

## 2013-07-13 DIAGNOSIS — S5000XA Contusion of unspecified elbow, initial encounter: Secondary | ICD-10-CM | POA: Insufficient documentation

## 2013-07-13 DIAGNOSIS — Z79899 Other long term (current) drug therapy: Secondary | ICD-10-CM | POA: Insufficient documentation

## 2013-07-13 DIAGNOSIS — Y92410 Unspecified street and highway as the place of occurrence of the external cause: Secondary | ICD-10-CM | POA: Insufficient documentation

## 2013-07-13 DIAGNOSIS — Z87828 Personal history of other (healed) physical injury and trauma: Secondary | ICD-10-CM | POA: Insufficient documentation

## 2013-07-13 HISTORY — DX: Unspecified dislocation of unspecified shoulder joint, initial encounter: S43.006A

## 2013-07-13 NOTE — Discharge Instructions (Signed)
Elbow Contusion °An elbow contusion is a deep bruise of the elbow. Contusions are the result of an injury that caused bleeding under the skin. The contusion may turn blue, purple, or yellow. Minor injuries will give you a painless contusion, but more severe contusions may stay painful and swollen for a few weeks.  °CAUSES  °An elbow contusion comes from a direct force to that area, such as falling on the elbow. °SYMPTOMS  °· Swelling and redness of the elbow. °· Bruising of the elbow area. °· Tenderness or soreness of the elbow. °DIAGNOSIS  °You will have a physical exam and will be asked about your history. You may need an X-ray of your elbow to look for a broken bone (fracture).  °TREATMENT  °A sling or splint may be needed to support your injury. Resting, elevating, and applying cold compresses to the elbow area are often the best treatments for an elbow contusion. Over-the-counter medicines may also be recommended for pain control. °HOME CARE INSTRUCTIONS  °· Put ice on the injured area. °· Put ice in a plastic bag. °· Place a towel between your skin and the bag. °· Leave the ice on for 15-20 minutes, 03-04 times a day. °· Only take over-the-counter or prescription medicines for pain, discomfort, or fever as directed by your caregiver. °· Rest your injured elbow until the pain and swelling are better. °· Elevate your elbow to reduce swelling. °· Apply a compression wrap as directed by your caregiver. This can help reduce swelling and motion. You may remove the wrap for sleeping, showers, and baths. If your fingers become numb, cold, or blue, take the wrap off and reapply it more loosely. °· Use your elbow only as directed by your caregiver. You may be asked to do range of motion exercises. Do them as directed. °· See your caregiver as directed. It is very important to keep all follow-up appointments in order to avoid any long-term problems with your elbow, including chronic pain or inability to move your elbow  normally. °SEEK IMMEDIATE MEDICAL CARE IF:  °· You have increased redness, swelling, or pain in your elbow. °· Your swelling or pain is not relieved with medicines. °· You have swelling of the hand and fingers. °· You are unable to move your fingers or wrist. °· You begin to lose feeling in your hand or fingers. °· Your fingers or hand become cold or blue. °MAKE SURE YOU:  °· Understand these instructions. °· Will watch your condition. °· Will get help right away if you are not doing well or get worse. °Document Released: 04/18/2006 Document Revised: 08/02/2011 Document Reviewed: 03/26/2011 °[employer]® Patient Information ©2014 [employer]. ° °
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
## 2013-07-14 ENCOUNTER — Telehealth: Payer: Self-pay | Admitting: Family

## 2013-07-14 NOTE — Telephone Encounter (Signed)
Spoke with pt and he states that the ER told him to contact his ortho specialist if his symptoms do not improve or worsen in a couple of days. He states he currently sees Delbert Harness and will contact them if it doesn't improve.

## 2013-07-14 NOTE — Telephone Encounter (Signed)
Please ask pt if he has appointment with ortho for his elbow injury (was seen in ED). If not, I will arrange.

## 2013-12-10 ENCOUNTER — Telehealth: Payer: Self-pay | Admitting: Family

## 2013-12-10 NOTE — Telephone Encounter (Signed)
Left message for pt to return my call. Pt will need appt with Provider if he is not going [facility]. Pt can bring records with him at that time.

## 2013-12-10 NOTE — Telephone Encounter (Signed)
Patient left message stating he has not been seen in a while because he has been going to the [place], he now has his records for the [place] and would like for Melissa to review, wants to know if he can drop those by the office or if she would like for him to schedule an appointment

## 2013-12-14 NOTE — Telephone Encounter (Signed)
Patient returned phone call. °

## 2013-12-14 NOTE — Telephone Encounter (Signed)
Left message for pt to return my call.

## 2013-12-18 NOTE — Telephone Encounter (Signed)
Notified pt. He has no immediate concerns and will drop off records from the [place] from the last year for Provider to review. He states he has seen the [place] numerous times and had CPE within the last year. I advised pt we would let him know when he should follow up with [place] after we review his records. Pt will bring records to the office.

## 2013-12-18 NOTE — Telephone Encounter (Signed)
Spoke with pt on 12/17/13 and he states that he wants [place] to be his back up PCP since we are closer than the [place] and he feels he is at the point where he only needs to see them annually. Per verbal from Provider, if pt wants [place] to manage any of his medical conditions or prescribe his medications then he will need to see [place] for follow ups on a regular basis. Left message for pt to return my call.

## 2015-05-25 HISTORY — PX: SMALL INTESTINE SURGERY: SHX150

## 2016-03-04 ENCOUNTER — Inpatient Hospital Stay (HOSPITAL_BASED_OUTPATIENT_CLINIC_OR_DEPARTMENT_OTHER)
Admission: EM | Admit: 2016-03-04 | Discharge: 2016-03-08 | DRG: 392 | Disposition: A | Discharge status: Home / Self Care | Payer: Medicare Other | Attending: Family Medicine | Admitting: Family Medicine

## 2016-03-04 ENCOUNTER — Encounter (HOSPITAL_BASED_OUTPATIENT_CLINIC_OR_DEPARTMENT_OTHER): Payer: Self-pay | Admitting: Emergency Medicine

## 2016-03-04 ENCOUNTER — Emergency Department (HOSPITAL_BASED_OUTPATIENT_CLINIC_OR_DEPARTMENT_OTHER): Payer: Medicare Other

## 2016-03-04 DIAGNOSIS — Z7951 Long term (current) use of inhaled steroids: Secondary | ICD-10-CM

## 2016-03-04 DIAGNOSIS — Z96653 Presence of artificial knee joint, bilateral: Secondary | ICD-10-CM | POA: Diagnosis present

## 2016-03-04 DIAGNOSIS — Z823 Family history of stroke: Secondary | ICD-10-CM

## 2016-03-04 DIAGNOSIS — K5720 Diverticulitis of large intestine with perforation and abscess without bleeding: Principal | ICD-10-CM | POA: Diagnosis present

## 2016-03-04 DIAGNOSIS — K219 Gastro-esophageal reflux disease without esophagitis: Secondary | ICD-10-CM | POA: Diagnosis present

## 2016-03-04 DIAGNOSIS — Z79899 Other long term (current) drug therapy: Secondary | ICD-10-CM

## 2016-03-04 DIAGNOSIS — J4540 Moderate persistent asthma, uncomplicated: Secondary | ICD-10-CM | POA: Diagnosis present

## 2016-03-04 DIAGNOSIS — M0609 Rheumatoid arthritis without rheumatoid factor, multiple sites: Secondary | ICD-10-CM | POA: Diagnosis present

## 2016-03-04 DIAGNOSIS — R1030 Lower abdominal pain, unspecified: Secondary | ICD-10-CM | POA: Diagnosis not present

## 2016-03-04 DIAGNOSIS — I10 Essential (primary) hypertension: Secondary | ICD-10-CM | POA: Diagnosis present

## 2016-03-04 DIAGNOSIS — Z8249 Family history of ischemic heart disease and other diseases of the circulatory system: Secondary | ICD-10-CM

## 2016-03-04 HISTORY — DX: Gastro-esophageal reflux disease without esophagitis: K21.9

## 2016-03-04 HISTORY — DX: Rheumatoid arthritis, unspecified: M06.9

## 2016-03-04 HISTORY — DX: Diverticulitis of large intestine with perforation and abscess without bleeding: K57.20

## 2016-03-04 HISTORY — DX: Essential (primary) hypertension: I10

## 2016-03-04 LAB — CBC WITH DIFFERENTIAL/PLATELET
BASOS PCT: 0 %
Basophils Absolute: 0 10*3/uL (ref 0.0–0.1)
EOS ABS: 0.1 10*3/uL (ref 0.0–0.7)
Eosinophils Relative: 0 %
HCT: 42.1 % (ref 39.0–52.0)
HEMOGLOBIN: 14 g/dL (ref 13.0–17.0)
Lymphocytes Relative: 14 %
Lymphs Abs: 2 10*3/uL (ref 0.7–4.0)
MCH: 29.5 pg (ref 26.0–34.0)
MCHC: 33.3 g/dL (ref 30.0–36.0)
MCV: 88.6 fL (ref 78.0–100.0)
MONOS PCT: 7 %
Monocytes Absolute: 1 10*3/uL (ref 0.1–1.0)
NEUTROS PCT: 79 %
Neutro Abs: 11 10*3/uL — ABNORMAL HIGH (ref 1.7–7.7)
PLATELETS: 293 10*3/uL (ref 150–400)
RBC: 4.75 MIL/uL (ref 4.22–5.81)
RDW: 14.1 % (ref 11.5–15.5)
WBC: 14.1 10*3/uL — ABNORMAL HIGH (ref 4.0–10.5)

## 2016-03-04 LAB — URINALYSIS, ROUTINE W REFLEX MICROSCOPIC
Bilirubin Urine: NEGATIVE
GLUCOSE, UA: NEGATIVE mg/dL
Hgb urine dipstick: NEGATIVE
KETONES UR: NEGATIVE mg/dL
LEUKOCYTES UA: NEGATIVE
Nitrite: NEGATIVE
PH: 5.5 (ref 5.0–8.0)
Protein, ur: NEGATIVE mg/dL
Specific Gravity, Urine: 1.006 (ref 1.005–1.030)

## 2016-03-04 LAB — COMPREHENSIVE METABOLIC PANEL
ALBUMIN: 4 g/dL (ref 3.5–5.0)
ALK PHOS: 57 U/L (ref 38–126)
ALT: 22 U/L (ref 17–63)
ANION GAP: 8 (ref 5–15)
AST: 19 U/L (ref 15–41)
BUN: 18 mg/dL (ref 6–20)
CHLORIDE: 102 mmol/L (ref 101–111)
CO2: 29 mmol/L (ref 22–32)
Calcium: 9.2 mg/dL (ref 8.9–10.3)
Creatinine, Ser: 1.02 mg/dL (ref 0.61–1.24)
GFR calc Af Amer: >60 mL/min (ref >60)
GFR calc non Af Amer: >60 mL/min (ref >60)
GLUCOSE: 78 mg/dL (ref 65–99)
POTASSIUM: 3.6 mmol/L (ref 3.5–5.1)
SODIUM: 139 mmol/L (ref 135–145)
Total Bilirubin: 0.6 mg/dL (ref 0.3–1.2)
Total Protein: 7.4 g/dL (ref 6.5–8.1)

## 2016-03-04 LAB — I-STAT CG4 LACTIC ACID, ED: Lactic Acid, Venous: 0.61 mmol/L (ref 0.5–1.9)

## 2016-03-04 LAB — LIPASE, BLOOD: Lipase: 24 U/L (ref 11–51)

## 2016-03-04 MED ORDER — PIPERACILLIN-TAZOBACTAM 3.375 G IVPB 30 MIN
3.3750 g | Freq: Once | INTRAVENOUS | Status: AC
Start: 2016-03-04 — End: 2016-03-04
  Administered 2016-03-04: 3.375 g via INTRAVENOUS
  Filled 2016-03-04 (×2): qty 50

## 2016-03-04 MED ORDER — SODIUM CHLORIDE 0.9 % IV BOLUS (SEPSIS)
1000.0000 mL | Freq: Once | INTRAVENOUS | Status: AC
  Administered 2016-03-04: 1000 mL via INTRAVENOUS

## 2016-03-04 MED ORDER — MORPHINE SULFATE (PF) 4 MG/ML IV SOLN
4.0000 mg | Freq: Once | INTRAVENOUS | Status: AC
  Administered 2016-03-04: 4 mg via INTRAVENOUS
  Filled 2016-03-04: qty 1

## 2016-03-04 MED ORDER — IOPAMIDOL (ISOVUE-300) INJECTION 61%
100.0000 mL | Freq: Once | INTRAVENOUS | Status: AC | PRN
Start: 2016-03-04 — End: 2016-03-04
  Administered 2016-03-04: 100 mL via INTRAVENOUS

## 2016-03-04 NOTE — ED Notes (Signed)
MD at bedside. 

## 2016-03-04 NOTE — ED Notes (Signed)
Warm blanket given

## 2016-03-04 NOTE — Progress Notes (Signed)
60 yo M with history of asthma/COPD and HTN who presents with 1d lower abdominal pain.  VSS.  WBC 14.1, lactate normal.  CT abdomen shows sigmoid diverticulitis with microperf no frank abscess.  Case discussed with Chrisandra Netters Surg, who recommended medical admission.  Got Zosyn, IVF, morphine.  Blood cultures obtained.    To med surg bed, inpatient status, bowel rest, IVF and antibiotics.

## 2016-03-04 NOTE — ED Triage Notes (Signed)
Pt in c/o sharp pain to suprapubic/periumbilical pain x today. Denies n/v/d or constipation. Pt alert, interactive, ambulatory in NAD.

## 2016-03-04 NOTE — ED Provider Notes (Signed)
[facility] Provider Note   CSN: [id number] Arrival date & time: 03/04/16  1906  By signing my name below, I, Austin Faulkner, attest that this documentation has been prepared under the direction and in the presence of Heide Scales, MD. Electronically Signed: Doreatha Faulkner, ED Scribe. 03/04/16. 8:40 PM.     History   Chief Complaint Chief Complaint  Patient presents with  . Abdominal Pain    HPI Austin Faulkner is a 60 y.o. male who presents to the Emergency Department complaining of multiple episodes of gradually worsening, 10/10, severe, sharp, suprapubic abdominal pain with radiation to his penis onset today and lasting minutes at a time. He currently rates his pain at a 7/10. Pt reports one similar episode of pain last week that lasted one minute, but states his pain did not recur again until today. Pt states when his severe pain subsides, he is left with constant throbbing pain. He states that during episodes of severe pain, his pain is worsened with movement. He does note a few episodes that began after urination. He also notes some penile numbness during the episodes of sharp pain, but states this has now resolved. He states he has been having normal BMs and having flatus with his current symptoms. No recent changes in medications. No h/o abdominal surgery. He denies hematuria, constipation, dysuria, malodorous urine, difficulty urinating, flank pain, leg pain, numbness or weakness to BLE, testicular pain, fever, chills, nausea, vomiting, CP.   The history is provided by the patient. No language interpreter was used.  Abdominal Pain   This is a new problem. The current episode started 6 to 12 hours ago. The problem occurs constantly. The problem has been gradually worsening. The pain is associated with an unknown factor. The pain is located in the suprapubic region and perineum. The quality of the pain is sharp and throbbing. The pain is at a severity of 10/10. The pain  is severe. Pertinent negatives include fever, diarrhea, nausea, vomiting, constipation, dysuria and headaches. The symptoms are aggravated by certain positions and urination. Nothing relieves the symptoms. Past workup includes GI consult. Past workup does not include surgery. His past medical history is significant for GERD.    Past Medical History:  Diagnosis Date  . Shoulder dislocation     Patient Active Problem List   Diagnosis Date Noted  . External hemorrhoid, bleeding 02/14/2012  . GERD (gastroesophageal reflux disease) 02/14/2012  . ACNE VULGARIS, FACIAL 07/03/2010  . BACK PAIN, ACUTE 07/03/2010  . TESTICULAR MASS, LEFT 02/25/2010  . DEGENERATIVE JOINT DISEASE 11/13/2009  . BRADYCARDIA 11/11/2009  . SKIN LESION 11/11/2009  . SYSTOLIC MURMUR 11/11/2009  . ASTHMA 07/21/2009    Past Surgical History:  Procedure Laterality Date  . TOTAL KNEE ARTHROPLASTY Bilateral        Home Medications    Prior to Admission medications   Medication Sig Start Date End Date Taking? Authorizing Provider  acetaminophen (TYLENOL) 325 MG tablet Take 2 tablets (650 mg total) by mouth every 6 (six) hours as needed for pain. 02/14/12   Sandford Craze, NP  albuterol (PROVENTIL HFA) 108 (90 BASE) MCG/ACT inhaler Inhale 2 puffs into the lungs every 6 (six) hours as needed.    Historical Provider, MD  budesonide-formoterol (SYMBICORT) 80-4.5 MCG/ACT inhaler Inhale 2 puffs into the lungs every morning.    Historical Provider, MD  docusate sodium (COLACE) 100 MG capsule Take 100 mg by mouth 2 (two) times daily as needed.    Historical Provider,  MD  hydrochlorothiazide (HYDRODIURIL) 25 MG tablet Take 25 mg by mouth daily.    Historical Provider, MD  ibuprofen (ADVIL,MOTRIN) 800 MG tablet Take 800 mg by mouth every 8 (eight) hours as needed.    Historical Provider, MD  loratadine (CLARITIN) 10 MG tablet Take 10 mg by mouth daily as needed.    Historical Provider, MD  NON FORMULARY Take 1 tablet by  mouth daily. OMEGA MEN Prostate and Virility    Historical Provider, MD  omeprazole (PRILOSEC) 20 MG capsule Take 20 mg by mouth 2 (two) times daily.    Historical Provider, MD  PARoxetine HCl (PAXIL PO) Take by mouth daily.    Historical Provider, MD  UNKNOWN TO PATIENT Taking sleep aid, "thoradan"    Historical Provider, MD    Family History History reviewed. No pertinent family history.  Social History Social History  Substance Use Topics  . Smoking status: Passive Smoke Exposure - Never Smoker  . Smokeless tobacco: Never Used  . Alcohol use No     Allergies   Review of patient's allergies indicates no known allergies.   Review of Systems Review of Systems  Constitutional: Negative for activity change, chills, diaphoresis, fatigue and fever.  HENT: Negative for congestion and rhinorrhea.   Eyes: Negative for visual disturbance.  Respiratory: Negative for cough, chest tightness, shortness of breath, wheezing and stridor.   Cardiovascular: Negative for chest pain, palpitations and leg swelling.  Gastrointestinal: Positive for abdominal pain. Negative for abdominal distention, blood in stool, constipation, diarrhea, nausea and vomiting.  Genitourinary: Negative for difficulty urinating, dysuria and flank pain.  Musculoskeletal: Negative for back pain and gait problem.  Skin: Negative for rash and wound.  Neurological: Negative for dizziness, weakness, light-headedness and headaches.  Psychiatric/Behavioral: Negative for agitation.  All other systems reviewed and are negative.    Physical Exam Updated Vital Signs BP 139/87   Pulse 60   Temp 97.9 F (36.6 C)   Resp 20   Ht 5\' 10"  (1.778 m)   Wt 204 lb (92.5 kg)   SpO2 100%   BMI 29.27 kg/m   Physical Exam  Constitutional: He is oriented to person, place, and time. He appears well-developed and well-nourished. No distress.  HENT:  Head: Normocephalic and atraumatic.  Right Ear: External ear normal.  Left Ear:  External ear normal.  Nose: Nose normal.  Mouth/Throat: Oropharynx is clear and moist. No oropharyngeal exudate.  Eyes: Conjunctivae and EOM are normal. Pupils are equal, round, and reactive to light.  Neck: Normal range of motion. Neck supple.  Cardiovascular: Normal rate, regular rhythm and normal heart sounds.   No murmur heard. Pulmonary/Chest: Effort normal and breath sounds normal. No stridor. No respiratory distress. He has no wheezes.  Abdominal: Soft. There is tenderness. There is no rebound and no guarding.  RLQ tenderness.   Genitourinary:  Genitourinary Comments: No groin or scrotal tenderness. No hernias appreciated. Chaperone present throughout entire exam.    Musculoskeletal: He exhibits no edema.  Neurological: He is alert and oriented to person, place, and time. He displays normal reflexes. No cranial nerve deficit. He exhibits normal muscle tone. Coordination normal.  Skin: Skin is warm. No rash noted. He is not diaphoretic. No erythema.  Nursing note and vitals reviewed.    ED Treatments / Results   DIAGNOSTIC STUDIES: Oxygen Saturation is 100% on RA, normal by my interpretation.    COORDINATION OF CARE: 8:37 PM Discussed treatment plan with pt at bedside which includes UA, lab work,  CT A/P, IVF and pt agreed to plan.    Labs (all labs ordered are listed, but only abnormal results are displayed) Labs Reviewed  CBC WITH DIFFERENTIAL/PLATELET - Abnormal; Notable for the following:       Result Value   WBC 14.1 (*)    Neutro Abs 11.0 (*)    All other components within normal limits  CULTURE, BLOOD (ROUTINE X 2)  CULTURE, BLOOD (ROUTINE X 2)  URINALYSIS, ROUTINE W REFLEX MICROSCOPIC (NOT AT [facility])  COMPREHENSIVE METABOLIC PANEL  LIPASE, BLOOD  I-STAT CG4 LACTIC ACID, ED  I-STAT CG4 LACTIC ACID, ED    EKG  EKG Interpretation None       Radiology Ct Abdomen Pelvis W Contrast  Result Date: 03/04/2016 CLINICAL DATA:  Abdominal pain.  Suprapubic pain.  EXAM: CT ABDOMEN AND PELVIS WITH CONTRAST TECHNIQUE: Multidetector CT imaging of the abdomen and pelvis was performed using the standard protocol following bolus administration of intravenous contrast. CONTRAST:  ISOVUE-300 IOPAMIDOL (ISOVUE-300) INJECTION 61% COMPARISON:  None. FINDINGS: Lower chest: The lung bases are clear. Hepatobiliary: No focal hepatic lesion. Probable dependent gallstones within physiologically distended gallbladder. No biliary dilatation. Pancreas: No ductal dilatation or inflammation. Spleen: Normal in size without focal abnormality. Adrenals/Urinary Tract: Duplicated left renal collecting system. No hydronephrosis. Homogeneous symmetric enhancement and excretion on delayed phase imaging. Urinary bladder is minimally distended, displaced right anteriorly secondary to colonic inflammation. There is associated of bladder wall thickening. No intravesicular air. No adrenal nodule. Stomach/Bowel: Acute diverticulitis with fat stranding and pericolonic edema involving the mid sigmoid colon with small focus of extraluminal air extending anteriorly. Small amount of free fluid. No pericolonic abscess. Additional noninflamed diverticula within the descending an sigmoid colon. Moderate stool bowel burden proximally. No small bowel inflammation. The appendix is normal. Vascular/Lymphatic: Abdominal aortic atherosclerosis without aneurysm. No retroperitoneal or pelvic adenopathy. Reproductive: Normal sized prostate gland. Other: Fat within the right inguinal canal. Tiny fat containing umbilical hernia. No upper abdominal ascites. No upper abdominal free air. Musculoskeletal: Degenerative disc disease and facet arthropathy at L4-L5 with associated anterolisthesis. There are no acute or suspicious osseous abnormalities. IMPRESSION: Acute sigmoid colonic diverticulitis with associated micro perforation. No abscess. Probable cholelithiasis, no gallbladder inflammation. These results were called by  telephone at the time of interpretation on 03/04/2016 at 10:05 pm to Dr. Lynden Oxford , who verbally acknowledged these results. Electronically Signed   By: Rubye Oaks M.D.   On: 03/04/2016 22:05    Procedures Procedures (including critical care time)  Medications Ordered in ED Medications  morphine 4 MG/ML injection 4 mg (not administered)  oxyCODONE (Oxy IR/ROXICODONE) immediate release tablet 5 mg (5 mg Oral Given 03/05/16 0204)  sodium chloride 0.9 % bolus 1,000 mL (0 mLs Intravenous Stopped 03/04/16 2220)  morphine 4 MG/ML injection 4 mg (4 mg Intravenous Given 03/04/16 2053)  iopamidol (ISOVUE-300) 61 % injection 100 mL (100 mLs Intravenous Contrast Given 03/04/16 2122)  piperacillin-tazobactam (ZOSYN) IVPB 3.375 g (0 g Intravenous Stopped 03/04/16 2308)  sodium chloride 0.9 % bolus 1,000 mL (0 mLs Intravenous Stopped 03/04/16 2357)  morphine 4 MG/ML injection 4 mg (4 mg Intravenous Given 03/04/16 2234)  morphine 4 MG/ML injection 4 mg (4 mg Intravenous Given 03/05/16 0032)     Initial Impression / Assessment and Plan / ED Course  I have reviewed the triage vital signs and the nursing notes.  Pertinent labs & imaging results that were available during my care of the patient were reviewed by me and considered  in my medical decision making (see chart for details).  Clinical Course    DEYAN LEAVINS is a 60 y.o. male who presents to the Emergency Department complaining of Abdominal pain.  History and exam are seen above.  Exam only significant for abdominal tenderness. Normal GU exam. Normal lung exam. Normal narrow exam. No back tenderness. No CVA tenderness.  Given location of pain in the lower abdomen, differential included kidney stone, UTI, diverticulitis, appendicitis, Aortic pathology and obstruction.  Patient given pain medicine and fluids.  Diagnostic workup included imaging and labs. Patient found to have leukocytosis but normal lactic acid.  Urinalysis showed no evidence of infection or blood.  CT scan showed acute diverticulitis with micro-perforation. Patient given more fluids and Zosyn.  General surgery called and they recommended admission to [facility] under the hospitalist service. Patient made NPO.   Patient's admitted in stable condition and was transferred without complication.    Final Clinical Impressions(s) / ED Diagnoses   Final diagnoses:  Diverticulitis of large intestine with perforation, unspecified bleeding status    I personally performed the services described in this documentation, which was scribed in my presence. The recorded information has been reviewed and is accurate.   Clinical Impression: 1. Diverticulitis of large intestine with perforation, unspecified bleeding status     Disposition: Admit to Hospitalist service with general surgery following]   Heide Scales, MD 03/05/16 [phone]

## 2016-03-04 NOTE — ED Notes (Signed)
Pt returned from CT. Wife has arrived and is at bedside at this time.

## 2016-03-05 ENCOUNTER — Encounter (HOSPITAL_BASED_OUTPATIENT_CLINIC_OR_DEPARTMENT_OTHER): Payer: Self-pay | Admitting: Emergency Medicine

## 2016-03-05 DIAGNOSIS — I10 Essential (primary) hypertension: Secondary | ICD-10-CM

## 2016-03-05 DIAGNOSIS — J4540 Moderate persistent asthma, uncomplicated: Secondary | ICD-10-CM

## 2016-03-05 DIAGNOSIS — M0609 Rheumatoid arthritis without rheumatoid factor, multiple sites: Secondary | ICD-10-CM | POA: Diagnosis present

## 2016-03-05 LAB — BASIC METABOLIC PANEL
ANION GAP: 5 (ref 5–15)
BUN: 12 mg/dL (ref 6–20)
CHLORIDE: 108 mmol/L (ref 101–111)
CO2: 28 mmol/L (ref 22–32)
Calcium: 8.3 mg/dL — ABNORMAL LOW (ref 8.9–10.3)
Creatinine, Ser: 1.03 mg/dL (ref 0.61–1.24)
GFR calc non Af Amer: >60 mL/min (ref >60)
GLUCOSE: 108 mg/dL — AB (ref 65–99)
Potassium: 3.7 mmol/L (ref 3.5–5.1)
Sodium: 141 mmol/L (ref 135–145)

## 2016-03-05 LAB — CBC
HEMATOCRIT: 38.5 % — AB (ref 39.0–52.0)
HEMOGLOBIN: 12.6 g/dL — AB (ref 13.0–17.0)
MCH: 29.2 pg (ref 26.0–34.0)
MCHC: 32.7 g/dL (ref 30.0–36.0)
MCV: 89.1 fL (ref 78.0–100.0)
Platelets: 269 10*3/uL (ref 150–400)
RBC: 4.32 MIL/uL (ref 4.22–5.81)
RDW: 14.1 % (ref 11.5–15.5)
WBC: 14.5 10*3/uL — AB (ref 4.0–10.5)

## 2016-03-05 MED ORDER — MORPHINE SULFATE (PF) 4 MG/ML IV SOLN
4.0000 mg | Freq: Once | INTRAVENOUS | Status: AC
  Administered 2016-03-05: 4 mg via INTRAVENOUS
  Filled 2016-03-05: qty 1

## 2016-03-05 MED ORDER — MORPHINE SULFATE (PF) 4 MG/ML IV SOLN
4.0000 mg | INTRAVENOUS | Status: DC | PRN
  Administered 2016-03-05 (×4): 4 mg via INTRAVENOUS
  Filled 2016-03-05 (×5): qty 1

## 2016-03-05 MED ORDER — ACETAMINOPHEN 325 MG PO TABS: 650.0000 mg | ORAL_TABLET | Freq: Four times a day (QID) | ORAL | Status: DC | PRN

## 2016-03-05 MED ORDER — LORATADINE 10 MG PO TABS: 10.0000 mg | ORAL_TABLET | Freq: Every day | ORAL | Status: DC | PRN

## 2016-03-05 MED ORDER — ACETAMINOPHEN 650 MG RE SUPP: 650.0000 mg | Freq: Four times a day (QID) | RECTAL | Status: DC | PRN

## 2016-03-05 MED ORDER — HYDROCHLOROTHIAZIDE 25 MG PO TABS
25.0000 mg | ORAL_TABLET | Freq: Every day | ORAL | Status: DC
  Administered 2016-03-05 – 2016-03-08 (×4): 25 mg via ORAL
  Filled 2016-03-05 (×4): qty 1

## 2016-03-05 MED ORDER — PIPERACILLIN-TAZOBACTAM 3.375 G IVPB
3.3750 g | Freq: Three times a day (TID) | INTRAVENOUS | Status: DC
  Administered 2016-03-05 – 2016-03-07 (×7): 3.375 g via INTRAVENOUS
  Filled 2016-03-05 (×7): qty 50

## 2016-03-05 MED ORDER — MONTELUKAST SODIUM 10 MG PO TABS
10.0000 mg | ORAL_TABLET | Freq: Every day | ORAL | Status: DC
  Administered 2016-03-05 – 2016-03-07 (×3): 10 mg via ORAL
  Filled 2016-03-05 (×3): qty 1

## 2016-03-05 MED ORDER — OXYCODONE HCL 5 MG PO TABS
5.0000 mg | ORAL_TABLET | ORAL | Status: DC | PRN
  Administered 2016-03-05 – 2016-03-07 (×8): 5 mg via ORAL
  Filled 2016-03-05 (×8): qty 1

## 2016-03-05 MED ORDER — MOMETASONE FURO-FORMOTEROL FUM 100-5 MCG/ACT IN AERO

## 2016-03-05 MED ORDER — PANTOPRAZOLE SODIUM 40 MG PO TBEC
40.0000 mg | DELAYED_RELEASE_TABLET | Freq: Every day | ORAL | Status: DC
  Administered 2016-03-05 – 2016-03-08 (×4): 40 mg via ORAL
  Filled 2016-03-05 (×4): qty 1

## 2016-03-05 MED ORDER — ENOXAPARIN SODIUM 40 MG/0.4ML ~~LOC~~ SOLN
40.0000 mg | SUBCUTANEOUS | Status: DC
  Administered 2016-03-05 – 2016-03-07 (×3): 40 mg via SUBCUTANEOUS
  Filled 2016-03-05 (×3): qty 0.4

## 2016-03-05 MED ORDER — SODIUM CHLORIDE 0.9 % IV SOLN
INTRAVENOUS | Status: DC
  Administered 2016-03-05 – 2016-03-06 (×3): via INTRAVENOUS

## 2016-03-05 NOTE — Progress Notes (Signed)
Agree with history physical and exam of ration Appreciate general surgeon input into case Patient will continue on clears and we will monitor Potentially can transition to full liquids a.m. if no significant pain  Pleas Koch, MD [facility] [phone]

## 2016-03-05 NOTE — Consult Note (Signed)
Reason for Consult: Diverticulitis  Referring Physician: Dr Wayna Chalet is an 60 y.o. male.  HPI: Pt presents with worsening LLQ pain. This has been present for about a week.  He has never had diverticulitis before.    Past Medical History:  Diagnosis Date  . GERD (gastroesophageal reflux disease)   . Hypertension   . Rheumatoid arthritis ([place])   . Shoulder dislocation     Past Surgical History:  Procedure Laterality Date  . TOTAL KNEE ARTHROPLASTY Bilateral     Family History  Problem Relation Age of Onset  . Lung cancer Mother   . Heart attack Father 20    "healthier than I am"  . Lupus Sister   . Stroke Paternal Grandmother   . Multiple myeloma Sister     BMT at [place] History:  reports that he is a non-smoker but has been exposed to tobacco smoke. He has never used smokeless tobacco. He reports that he does not drink alcohol or use drugs.  Allergies: No Known Allergies  Medications: I have reviewed the patient's current medications.  Results for orders placed or performed during the hospital encounter of 03/04/16 (from the past 48 hour(s))  Urinalysis, Routine w reflex microscopic (not at [facility])     Status: None   Collection Time: 03/04/16  7:50 PM  Result Value Ref Range   Color, Urine YELLOW YELLOW   APPearance CLEAR CLEAR   Specific Gravity, Urine 1.006 1.005 - 1.030   pH 5.5 5.0 - 8.0   Glucose, UA NEGATIVE NEGATIVE mg/dL   Hgb urine dipstick NEGATIVE NEGATIVE   Bilirubin Urine NEGATIVE NEGATIVE   Ketones, ur NEGATIVE NEGATIVE mg/dL   Protein, ur NEGATIVE NEGATIVE mg/dL   Nitrite NEGATIVE NEGATIVE   Leukocytes, UA NEGATIVE NEGATIVE    Comment: MICROSCOPIC NOT DONE ON URINES WITH NEGATIVE PROTEIN, BLOOD, LEUKOCYTES, NITRITE, OR GLUCOSE <1000 mg/dL.  CBC with Differential     Status: Abnormal   Collection Time: 03/04/16  8:45 PM  Result Value Ref Range   WBC 14.1 (H) 4.0 - 10.5 K/uL   RBC 4.75 4.22 - 5.81 MIL/uL   Hemoglobin 14.0  13.0 - 17.0 g/dL   HCT 42.1 39.0 - 52.0 %   MCV 88.6 78.0 - 100.0 fL   MCH 29.5 26.0 - 34.0 pg   MCHC 33.3 30.0 - 36.0 g/dL   RDW 14.1 11.5 - 15.5 %   Platelets 293 150 - 400 K/uL   Neutrophils Relative % 79 %   Neutro Abs 11.0 (H) 1.7 - 7.7 K/uL   Lymphocytes Relative 14 %   Lymphs Abs 2.0 0.7 - 4.0 K/uL   Monocytes Relative 7 %   Monocytes Absolute 1.0 0.1 - 1.0 K/uL   Eosinophils Relative 0 %   Eosinophils Absolute 0.1 0.0 - 0.7 K/uL   Basophils Relative 0 %   Basophils Absolute 0.0 0.0 - 0.1 K/uL  Comprehensive metabolic panel     Status: None   Collection Time: 03/04/16  8:45 PM  Result Value Ref Range   Sodium 139 135 - 145 mmol/L   Potassium 3.6 3.5 - 5.1 mmol/L   Chloride 102 101 - 111 mmol/L   CO2 29 22 - 32 mmol/L   Glucose, Bld 78 65 - 99 mg/dL   BUN 18 6 - 20 mg/dL   Creatinine, Ser 1.02 0.61 - 1.24 mg/dL   Calcium 9.2 8.9 - 10.3 mg/dL   Total Protein 7.4 6.5 - 8.1  g/dL   Albumin 4.0 3.5 - 5.0 g/dL   AST 19 15 - 41 U/L   ALT 22 17 - 63 U/L   Alkaline Phosphatase 57 38 - 126 U/L   Total Bilirubin 0.6 0.3 - 1.2 mg/dL   GFR calc non Af Amer >60 >60 mL/min   GFR calc Af Amer >60 >60 mL/min    Comment: (NOTE) The eGFR has been calculated using the CKD EPI equation. This calculation has not been validated in all clinical situations. eGFR's persistently <60 mL/min signify possible Chronic Kidney Disease.    Anion gap 8 5 - 15  Lipase, blood     Status: None   Collection Time: 03/04/16  8:45 PM  Result Value Ref Range   Lipase 24 11 - 51 U/L  I-Stat CG4 Lactic Acid, ED     Status: None   Collection Time: 03/04/16  8:54 PM  Result Value Ref Range   Lactic Acid, Venous 0.61 0.5 - 1.9 mmol/L  CBC     Status: Abnormal   Collection Time: 03/05/16  5:50 AM  Result Value Ref Range   WBC 14.5 (H) 4.0 - 10.5 K/uL   RBC 4.32 4.22 - 5.81 MIL/uL   Hemoglobin 12.6 (L) 13.0 - 17.0 g/dL   HCT 38.5 (L) 39.0 - 52.0 %   MCV 89.1 78.0 - 100.0 fL   MCH 29.2 26.0 - 34.0 pg    MCHC 32.7 30.0 - 36.0 g/dL   RDW 14.1 11.5 - 15.5 %   Platelets 269 150 - 400 K/uL  Basic metabolic panel     Status: Abnormal   Collection Time: 03/05/16  5:50 AM  Result Value Ref Range   Sodium 141 135 - 145 mmol/L   Potassium 3.7 3.5 - 5.1 mmol/L   Chloride 108 101 - 111 mmol/L   CO2 28 22 - 32 mmol/L   Glucose, Bld 108 (H) 65 - 99 mg/dL   BUN 12 6 - 20 mg/dL   Creatinine, Ser 1.03 0.61 - 1.24 mg/dL   Calcium 8.3 (L) 8.9 - 10.3 mg/dL   GFR calc non Af Amer >60 >60 mL/min   GFR calc Af Amer >60 >60 mL/min    Comment: (NOTE) The eGFR has been calculated using the CKD EPI equation. This calculation has not been validated in all clinical situations. eGFR's persistently <60 mL/min signify possible Chronic Kidney Disease.    Anion gap 5 5 - 15    Ct Abdomen Pelvis W Contrast  Result Date: 03/04/2016 CLINICAL DATA:  Abdominal pain.  Suprapubic pain. EXAM: CT ABDOMEN AND PELVIS WITH CONTRAST TECHNIQUE: Multidetector CT imaging of the abdomen and pelvis was performed using the standard protocol following bolus administration of intravenous contrast. CONTRAST:  141m ISOVUE-300 IOPAMIDOL (ISOVUE-300) INJECTION 61% COMPARISON:  None. FINDINGS: Lower chest: The lung bases are clear. Hepatobiliary: No focal hepatic lesion. Probable dependent gallstones within physiologically distended gallbladder. No biliary dilatation. Pancreas: No ductal dilatation or inflammation. Spleen: Normal in size without focal abnormality. Adrenals/Urinary Tract: Duplicated left renal collecting system. No hydronephrosis. Homogeneous symmetric enhancement and excretion on delayed phase imaging. Urinary bladder is minimally distended, displaced right anteriorly secondary to colonic inflammation. There is associated of bladder wall thickening. No intravesicular air. No adrenal nodule. Stomach/Bowel: Acute diverticulitis with fat stranding and pericolonic edema involving the mid sigmoid colon with small focus of  extraluminal air extending anteriorly. Small amount of free fluid. No pericolonic abscess. Additional noninflamed diverticula within the descending an sigmoid colon. Moderate stool  bowel burden proximally. No small bowel inflammation. The appendix is normal. Vascular/Lymphatic: Abdominal aortic atherosclerosis without aneurysm. No retroperitoneal or pelvic adenopathy. Reproductive: Normal sized prostate gland. Other: Fat within the right inguinal canal. Tiny fat containing umbilical hernia. No upper abdominal ascites. No upper abdominal free air. Musculoskeletal: Degenerative disc disease and facet arthropathy at L4-L5 with associated anterolisthesis. There are no acute or suspicious osseous abnormalities. IMPRESSION: Acute sigmoid colonic diverticulitis with associated micro perforation. No abscess. Probable cholelithiasis, no gallbladder inflammation. These results were called by telephone at the time of interpretation on 03/04/2016 at 10:05 pm to Dr. Marda Stalker , who verbally acknowledged these results. Electronically Signed   By: Jeb Levering M.D.   On: 03/04/2016 22:05    Review of Systems  Constitutional: Negative for chills and fever.  Eyes: Negative for blurred vision.  Respiratory: Negative for cough and shortness of breath.   Cardiovascular: Negative for chest pain.  Gastrointestinal: Positive for abdominal pain and nausea. Negative for vomiting.  Genitourinary: Positive for dysuria. Negative for urgency.  Musculoskeletal: Negative for myalgias.  Skin: Negative for rash.  Neurological: Negative for dizziness and headaches.   Blood pressure 115/75, pulse (!) 51, temperature 98.9 F (37.2 C), temperature source Oral, resp. rate 18, height _0  (1.778 m), weight 98 kg (216 lb 0.8 oz), SpO2 95 %. Physical Exam  Constitutional: He is oriented to person, place, and time. He appears well-developed and well-nourished.  HENT:  Head: Normocephalic and atraumatic.  Eyes:  Conjunctivae and EOM are normal. Pupils are equal, round, and reactive to light.  Neck: Normal range of motion. Neck supple.  Cardiovascular: Normal rate and regular rhythm.   Respiratory: Effort normal and breath sounds normal. No respiratory distress.  GI: Soft. He exhibits no distension. There is tenderness. There is no rebound and no guarding.  LLQ  Musculoskeletal: Normal range of motion.  Neurological: He is alert and oriented to person, place, and time.  Skin: Skin is warm and dry.    Assessment/Plan: 60 y.o. M with diverticulitis and microperforation.  Continue IV antibiotics and bowel rest.  Pt ok for liquid diet if no nausea.  If pain and inflammation subsides, recommend f/u with me as an outpatient to discuss colectomy.    Lisl Slingerland C. [id number]/93/7169, 7:19 AM

## 2016-03-05 NOTE — Progress Notes (Signed)
Assumed care of patient from previous RN.  Agree with previous RN's assessment of patient.  Will continue to monitor. 

## 2016-03-05 NOTE — Progress Notes (Signed)
PHARMACY NOTE -  Zosyn  Pharmacy has been assisting with dosing of Zosyn for IAI. Dosage remains stable at 3.375gm IV q8h infused over 4hrs and need for further dosage adjustment appears unlikely at present.    Noted plan for oral abx & discharge in 24-48hrs.   Will sign off at this time.  Please reconsult if a change in clinical status warrants re-evaluation of dosage.  Junita Push, PharmD, BCPS Pager: [phone] 03/05/2016@8 :50 AM

## 2016-03-05 NOTE — Progress Notes (Signed)
Pharmacy Antibiotic Note  Austin Faulkner is a 60 y.o. male admitted on 03/04/2016 with Intra-abdominal infection.  Pharmacy has been consulted for zosyn dosing.  Plan: Zosyn 3.375g IV q8h (4 hour infusion).  Height: 5\' 10"  (177.8 cm) Weight: 216 lb 0.8 oz (98 kg) IBW/kg (Calculated) : 73  Temp (24hrs), Avg:98.4 F (36.9 C), Min:97.9 F (36.6 C), Max:98.9 F (37.2 C)   Recent Labs Lab 03/04/16 2045 03/04/16 2054  WBC 14.1*  --   CREATININE 1.02  --   LATICACIDVEN  --  0.61    Estimated Creatinine Clearance: 93.8 mL/min (by C-G formula based on SCr of 1.02 mg/dL).    No Known Allergies  Antimicrobials this admission: Zosyn 10/13 >>  Dose adjustments this admission: -  Microbiology results: pending  Thank you for allowing pharmacy to be a part of this patient's care.  11/13 [place] 03/05/2016 4:37 AM

## 2016-03-05 NOTE — H&P (Signed)
History and Physical  Patient Name: Austin Faulkner     FUX:[id number]    DOB: 08-11-1955    DOA: 03/04/2016 PCP: Pcp Not In System Follows with PCP at the [facility]  Patient coming from: Home  Chief Complaint: Abdominal pain  HPI: Austin Faulkner is a 60 y.o. male with a past medical history significant for asthma/allergies, HTN, and RA not on DMARD who presents with progressive abdominal pain for one week.  The patient was in his usual state of health until about one week ago when he started to have a mild to moderate intermittent lower abdominal pain, sometimes worse after peeing.  Today, all day, he has had a dull aching moderate intensity pain, punctuated by several episodes of severe/excruciating pain in the lower abdomen, lasting for several minutes.  This more severe pain lasted all day, and so eventually the patient came to the emergency room.  ED course: -Afebrile, heart rate 60s, respirations 20, blood pressure 140/80 -Na 139, K 3.6, Cr 1.02, WBC 14.1K, Hgb 14 -Urinalysis without hematuria. -Lipase normal -Lactic acid 0.6 -CT of the abdomen and pelvis was performed which showed sigmoid diverticulitis, inflammation of the bladder, microperforation -The case was discussed with Dr. Marcello Moores from general surgery who recommended medical admission for IV antibiotics and general surgery evaluation    The patient reports he was recently diagnosed with rheumatoid arthritis, briefly started methotrexate, but had a severe reaction to it, and instead started a Mediterranean diet, cut liver oil and turmeric and has had almost complete resolution of his symptoms.  The patient also has asthma and severe fall allergies, for which he takes a daily maintenance inhaler and has relatively well-controlled symptoms.  He follows at the [place].    ROS: Review of Systems  Constitutional: Negative for chills and fever.  Gastrointestinal: Positive for abdominal pain. Negative for blood in stool and  vomiting.  Genitourinary: Positive for dysuria. Negative for flank pain, frequency, hematuria and urgency.  All other systems reviewed and are negative.         Past Medical History:  Diagnosis Date  . GERD (gastroesophageal reflux disease)   . Hypertension   . Rheumatoid arthritis ([place])   . Shoulder dislocation     Past Surgical History:  Procedure Laterality Date  . TOTAL KNEE ARTHROPLASTY Bilateral     Social History: Patient lives with his wife.  The patient walks unassisted.  He does not smoke.  He is from [place] originally. His father was in the [employer]. He garduated from [employer] in '77, served in the [employer] himself for many years, was stationed in [place] and [place]. 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    No Known Allergies  Family history: family history includes Heart attack (age of onset: 48) in his father; Lung cancer in his mother; Lupus in his sister; Multiple myeloma in his sister; Stroke in his paternal grandmother.  Prior to Admission medications   Medication Sig Start Date End Date Taking? Authorizing Provider  albuterol (PROVENTIL HFA) 108 (90 BASE) MCG/ACT inhaler Inhale 2 puffs into the lungs every 6 (six) hours as needed.   Yes Historical Provider, MD  budesonide-formoterol (SYMBICORT) 80-4.5 MCG/ACT inhaler Inhale 2 puffs into the lungs every morning.   Yes Historical Provider, MD  hydrochlorothiazide (HYDRODIURIL) 25 MG tablet Take 25 mg by mouth daily.   Yes Historical Provider, MD  ibuprofen (ADVIL,MOTRIN) 800 MG tablet Take 800 mg by mouth every  8 (eight) hours as needed for moderate pain.    Yes Historical Provider, MD  loratadine (CLARITIN) 10 MG tablet Take 10 mg by mouth daily as needed for allergies.    Yes Historical Provider, MD  montelukast (SINGULAIR) 10 MG tablet Take 10 mg by mouth at bedtime.   Yes Historical Provider, MD  Multiple Vitamin (MULTIVITAMIN WITH MINERALS) TABS tablet  Take 1 tablet by mouth daily.   Yes Historical Provider, MD  omeprazole (PRILOSEC) 20 MG capsule Take 20 mg by mouth daily.    Yes Historical Provider, MD  TURMERIC CURCUMIN PO Take 1 tablet by mouth daily.   Yes Historical Provider, MD  acetaminophen (TYLENOL) 325 MG tablet Take 2 tablets (650 mg total) by mouth every 6 (six) hours as needed for pain. Patient not taking: Reported on 03/05/2016 02/14/12   [name], NP       Physical Exam: BP (!) 141/83 (BP Location: Right Arm)   Pulse (!) 59   Temp 98.4 F (36.9 C) (Oral)   Resp 18   Ht _0  (1.778 m)   Wt 98 kg (216 lb 0.8 oz)   SpO2 100%   BMI 31.00 kg/m  General appearance: Well-developed, adult male, alert and in no acute distress.   Eyes: Anicteric, conjunctiva pink, lids and lashes normal. PERRL.    ENT: No nasal deformity, discharge, epistaxis.  Hearing normal. OP moist without lesions.   Neck: No neck masses.  Trachea midline.  No thyromegaly/tenderness. Lymph: No cervical or supraclavicular lymphadenopathy. Skin: Warm and dry.  No jaundice.  No suspicious rashes or lesions. Cardiac: RRR, nl S1-S2, no murmurs appreciated.  Capillary refill is brisk.  JVP normal.  No LE edema.  Radial and DP pulses 2+ and symmetric. Respiratory: Normal respiratory rate and rhythm.  CTAB without rales or wheezes. Abdomen: Abdomen soft.  Mild lower abdominal TTP, no rebound or guarding or rigidity. No ascites, distension, hepatosplenomegaly.   MSK: No deformities or effusions.  No cyanosis or clubbing. Neuro: Cranial nerves normaol.  Sensation intact to light touch. Speech is fluent.  Muscle strength noprmal.    Psych: Sensorium intact and responding to questions, attention normal.  Behavior appropriate.  Affect normal.  Judgment and insight appear normal.     Labs on Admission:  I have personally reviewed following labs and imaging studies: CBC:  Recent Labs Lab 03/04/16 2045  WBC 14.1*  NEUTROABS 11.0*  HGB 14.0  HCT  42.1  MCV 88.6  PLT 403   Basic Metabolic Panel:  Recent Labs Lab 03/04/16 2045  NA 139  K 3.6  CL 102  CO2 29  GLUCOSE 78  BUN 18  CREATININE 1.02  CALCIUM 9.2   GFR: Estimated Creatinine Clearance: 93.8 mL/min (by C-G formula based on SCr of 1.02 mg/dL).  Liver Function Tests:  Recent Labs Lab 03/04/16 2045  AST 19  ALT 22  ALKPHOS 57  BILITOT 0.6  PROT 7.4  ALBUMIN 4.0    Recent Labs Lab 03/04/16 2045  LIPASE 24   No results for input(s): AMMONIA in the last 168 hours. Coagulation Profile: No results for input(s): INR, PROTIME in the last 168 hours. Cardiac Enzymes: No results for input(s): CKTOTAL, CKMB, CKMBINDEX, TROPONINI in the last 168 hours. BNP (last 3 results) No results for input(s): PROBNP in the last 8760 hours. HbA1C: No results for input(s): HGBA1C in the last 72 hours. CBG: No results for input(s): GLUCAP in the last 168 hours. Lipid Profile: No results for input(s):  CHOL, HDL, LDLCALC, TRIG, CHOLHDL, LDLDIRECT in the last 72 hours. Thyroid Function Tests: No results for input(s): TSH, T4TOTAL, FREET4, T3FREE, THYROIDAB in the last 72 hours. Anemia Panel: No results for input(s): VITAMINB12, FOLATE, FERRITIN, TIBC, IRON, RETICCTPCT in the last 72 hours. Sepsis Labs: Lactic acid 0.6 Invalid input(s): PROCALCITONIN, LACTICIDVEN No results found for this or any previous visit (from the past 240 hour(s)).       Radiological Exams on Admission: Personally reviewed following report: Ct Abdomen Pelvis W Contrast  Result Date: 03/04/2016 CLINICAL DATA:  Abdominal pain.  Suprapubic pain. EXAM: CT ABDOMEN AND PELVIS WITH CONTRAST TECHNIQUE: Multidetector CT imaging of the abdomen and pelvis was performed using the standard protocol following bolus administration of intravenous contrast. CONTRAST:  128m ISOVUE-300 IOPAMIDOL (ISOVUE-300) INJECTION 61% COMPARISON:  None. FINDINGS: Lower chest: The lung bases are clear. Hepatobiliary: No focal  hepatic lesion. Probable dependent gallstones within physiologically distended gallbladder. No biliary dilatation. Pancreas: No ductal dilatation or inflammation. Spleen: Normal in size without focal abnormality. Adrenals/Urinary Tract: Duplicated left renal collecting system. No hydronephrosis. Homogeneous symmetric enhancement and excretion on delayed phase imaging. Urinary bladder is minimally distended, displaced right anteriorly secondary to colonic inflammation. There is associated of bladder wall thickening. No intravesicular air. No adrenal nodule. Stomach/Bowel: Acute diverticulitis with fat stranding and pericolonic edema involving the mid sigmoid colon with small focus of extraluminal air extending anteriorly. Small amount of free fluid. No pericolonic abscess. Additional noninflamed diverticula within the descending an sigmoid colon. Moderate stool bowel burden proximally. No small bowel inflammation. The appendix is normal. Vascular/Lymphatic: Abdominal aortic atherosclerosis without aneurysm. No retroperitoneal or pelvic adenopathy. Reproductive: Normal sized prostate gland. Other: Fat within the right inguinal canal. Tiny fat containing umbilical hernia. No upper abdominal ascites. No upper abdominal free air. Musculoskeletal: Degenerative disc disease and facet arthropathy at L4-L5 with associated anterolisthesis. There are no acute or suspicious osseous abnormalities. IMPRESSION: Acute sigmoid colonic diverticulitis with associated micro perforation. No abscess. Probable cholelithiasis, no gallbladder inflammation. These results were called by telephone at the time of interpretation on 03/04/2016 at 10:05 pm to Dr. CMarda Stalker, who verbally acknowledged these results. Electronically Signed   By: MJeb LeveringM.D.   On: 03/04/2016 22:05        Assessment/Plan Active Problems:   Moderate persistent asthma, uncomplicated   Diverticulitis of colon with perforation   Essential  hypertension   Rheumatoid arthritis of multiple sites without rheumatoid factor ([place]  1. Diverticulitis of sigmoid with microperforation:  No frank abscess on CT and no peritonitis on exam.     -Piperacillin-tazobactam IV -Clear diet only -MIVF -Acetaminophen, oxycodone or morphine for pain -Ondansetron for nausea -Consult to General Surgery, appreciate cares   2. Asthma:  -Continue Symbicort as formulary alternative -Continue loratadine and Singulair  3. HTN:  -Continue HCTZ  4. Other medications:  -Continue PPI      DVT prophylaxis: Lovenox  Code Status: FULL  Family Communication: None present  Disposition Plan: Anticipate bowel rest, IV antibiotics and serial abdominal exams for 24 hours.   If no clinical worseninig, expect home with oral antibiotics within 24-48 hours. Consults called: General Surgery Admission status: OBS, med surg        Medical decision making: Patient seen at 1:30 AM on 03/05/2016.  The patient was discussed with Dr. TSherry Ruffing  What exists of the patient's chart was reviewed in depth and summarized above.  Clinical condition: stable.        CSullivan  [facility] Pager [phone]

## 2016-03-06 DIAGNOSIS — K5720 Diverticulitis of large intestine with perforation and abscess without bleeding: Principal | ICD-10-CM

## 2016-03-06 LAB — BASIC METABOLIC PANEL
ANION GAP: 7 (ref 5–15)
BUN: 10 mg/dL (ref 6–20)
CHLORIDE: 106 mmol/L (ref 101–111)
CO2: 27 mmol/L (ref 22–32)
Calcium: 8.4 mg/dL — ABNORMAL LOW (ref 8.9–10.3)
Creatinine, Ser: 1.02 mg/dL (ref 0.61–1.24)
GFR calc Af Amer: >60 mL/min (ref >60)
GLUCOSE: 91 mg/dL (ref 65–99)
POTASSIUM: 3.8 mmol/L (ref 3.5–5.1)
Sodium: 140 mmol/L (ref 135–145)

## 2016-03-06 LAB — CBC WITH DIFFERENTIAL/PLATELET
BASOS ABS: 0 10*3/uL (ref 0.0–0.1)
Basophils Relative: 0 %
EOS PCT: 0 %
Eosinophils Absolute: 0 10*3/uL (ref 0.0–0.7)
HCT: 37.2 % — ABNORMAL LOW (ref 39.0–52.0)
HEMOGLOBIN: 12.2 g/dL — AB (ref 13.0–17.0)
LYMPHS ABS: 1.7 10*3/uL (ref 0.7–4.0)
LYMPHS PCT: 13 %
MCH: 28.6 pg (ref 26.0–34.0)
MCHC: 32.8 g/dL (ref 30.0–36.0)
MCV: 87.1 fL (ref 78.0–100.0)
MONO ABS: 1.2 10*3/uL — AB (ref 0.1–1.0)
MONOS PCT: 9 %
NEUTROS ABS: 9.7 10*3/uL — AB (ref 1.7–7.7)
Neutrophils Relative %: 78 %
PLATELETS: 253 10*3/uL (ref 150–400)
RBC: 4.27 MIL/uL (ref 4.22–5.81)
RDW: 14.1 % (ref 11.5–15.5)
WBC: 12.6 10*3/uL — ABNORMAL HIGH (ref 4.0–10.5)

## 2016-03-06 MED ORDER — LIP MEDEX EX OINT
1.0000 "application " | TOPICAL_OINTMENT | Freq: Two times a day (BID) | CUTANEOUS | Status: DC
  Administered 2016-03-06 – 2016-03-07 (×4): 1 via TOPICAL
  Filled 2016-03-06 (×2): qty 7

## 2016-03-06 MED ORDER — MENTHOL 3 MG MT LOZG: 1.0000 | LOZENGE | OROMUCOSAL | Status: DC | PRN

## 2016-03-06 MED ORDER — ALBUTEROL SULFATE (2.5 MG/3ML) 0.083% IN NEBU
2.5000 mg | INHALATION_SOLUTION | Freq: Four times a day (QID) | RESPIRATORY_TRACT | Status: DC | PRN
  Administered 2016-03-07: 2.5 mg via RESPIRATORY_TRACT
  Filled 2016-03-06: qty 3

## 2016-03-06 MED ORDER — PROCHLORPERAZINE EDISYLATE 5 MG/ML IJ SOLN: 5.0000 mg | INTRAMUSCULAR | Status: DC | PRN

## 2016-03-06 MED ORDER — MAGIC MOUTHWASH
15.0000 mL | Freq: Four times a day (QID) | ORAL | Status: DC | PRN
  Filled 2016-03-06: qty 15

## 2016-03-06 MED ORDER — LACTATED RINGERS IV BOLUS (SEPSIS): 1000.0000 mL | Freq: Three times a day (TID) | INTRAVENOUS | Status: AC | PRN

## 2016-03-06 MED ORDER — SACCHAROMYCES BOULARDII 250 MG PO CAPS
250.0000 mg | ORAL_CAPSULE | Freq: Two times a day (BID) | ORAL | Status: DC
  Administered 2016-03-06 – 2016-03-08 (×5): 250 mg via ORAL
  Filled 2016-03-06 (×5): qty 1

## 2016-03-06 MED ORDER — ACETAMINOPHEN 325 MG PO TABS: 325.0000 mg | ORAL_TABLET | Freq: Four times a day (QID) | ORAL | Status: DC | PRN

## 2016-03-06 MED ORDER — ADULT MULTIVITAMIN W/MINERALS CH
1.0000 | ORAL_TABLET | Freq: Every day | ORAL | Status: DC
  Administered 2016-03-06 – 2016-03-07 (×2): 1 via ORAL
  Filled 2016-03-06 (×2): qty 1

## 2016-03-06 MED ORDER — ALBUTEROL SULFATE HFA 108 (90 BASE) MCG/ACT IN AERS: 1.0000 | INHALATION_SPRAY | Freq: Four times a day (QID) | RESPIRATORY_TRACT | Status: DC | PRN

## 2016-03-06 MED ORDER — PHENOL 1.4 % MT LIQD: 2.0000 | OROMUCOSAL | Status: DC | PRN

## 2016-03-06 MED ORDER — ALUM & MAG HYDROXIDE-SIMETH 200-200-20 MG/5ML PO SUSP
30.0000 mL | Freq: Four times a day (QID) | ORAL | Status: DC | PRN
  Administered 2016-03-07: 30 mL via ORAL
  Filled 2016-03-06: qty 30

## 2016-03-06 NOTE — Progress Notes (Signed)
[address] [place], [place] [address] Phone: [phone] FAX: [phone]   BERLE FITZ [id number] August 03, 1955  CARE TEAM:  PCP: Pcp Not In System  Outpatient Care Team: Patient Care Team: Pcp Not In System as PCP - General  Inpatient Treatment Team: Treatment Team: Attending Provider: Nita Sells, MD; Technician: Arlys John, NT; Rounding Team: Redmond Baseman, MD; Consulting Physician: Nolon Nations, MD; Registered Nurse: Delena Bali, RN  Problem List:   Principal Problem:   Diverticulitis of colon with perforation Active Problems:   Moderate persistent asthma, uncomplicated   Essential hypertension   Rheumatoid arthritis of multiple sites without rheumatoid factor (HCC)           Assessment  Diverticulitis with perforation - improving clinically  Plan:  -adv diet to fulls.  Bland in AM if better -IV ABx -VTE prophylaxis- SCDs, etc -mobilize as tolerated to help recovery -If meets discharge criteria, transition to oral antibiotics with follow-up in [place] surgery to consider elective colonic resection and 6-8 weeks given complex attack of diverticulitis with perforation.  Tentative follow up with Dr. Marcello Moores  Patient had colonoscopy done in the past year at the [facility] that apparently was underwhelming.  I encouraged him to try and get a copy of that report for follow-up in the office.  D/C patient from hospital when patient meets criteria (anticipate in 1-3 day(s)):  Tolerating oral intake well Ambulating well Adequate pain control without IV medications Urinating  Having flatus Disposition planning in place   Adin Hector, M.D., F.A.C.S. Gastrointestinal and Minimally Invasive Surgery [facility] [address], [place], [place] [address] [phone] Main / Paging   03/06/2016  Subjective:  Pain much less Hungry Wife at  bedside  Objective:  Vital signs:  Vitals:   03/05/16 1318 03/05/16 2050 03/06/16 0656 03/06/16 0813  BP: 108/67 111/74 (!) 107/59   Pulse: (!) 48 70 88   Resp: 18 18 17    Temp: 98.6 F (37 C) 98.8 F (37.1 C) 98.2 F (36.8 C)   TempSrc: Oral Oral Oral   SpO2: 98% 100% 100% 100%  Weight:      Height:        Last BM Date: 03/05/16  Intake/Output   Yesterday:  10/13 0701 - 10/14 0700 In: 3903.8 [P.O.:660; I.V.:3093.8; IV Piggyback:150] Out: 1925 [Urine:1925] This shift:  No intake/output data recorded.  Bowel function:  Flatus: YES  BM:  No  Drain: (No drain)   Physical Exam:  General: Pt awake/alert/oriented x4 in No acute distress Eyes: PERRL, normal EOM.  Sclera clear.  No icterus Neuro: CN II-XII intact w/o focal sensory/motor deficits. Lymph: No head/neck/groin lymphadenopathy Psych:  No delerium/psychosis/paranoia HENT: Normocephalic, Mucus membranes moist.  No thrush Neck: Supple, No tracheal deviation Chest: No chest wall pain w good excursion CV:  Pulses intact.  Regular rhythm MS: Normal AROM mjr joints.  No obvious deformity Abdomen: Soft.  Nondistended.  Tenderness at LLQ - very mild now.  No evidence of peritonitis.  No incarcerated hernias. Ext:  SCDs BLE.  No mjr edema.  No cyanosis Skin: No petechiae / purpura  Results:   Labs: Results for orders placed or performed during the hospital encounter of 03/04/16 (from the past 48 hour(s))  Urinalysis, Routine w reflex microscopic (not at [facility])     Status: None   Collection Time: 03/04/16  7:50 PM  Result Value Ref Range  Color, Urine YELLOW YELLOW   APPearance CLEAR CLEAR   Specific Gravity, Urine 1.006 1.005 - 1.030   pH 5.5 5.0 - 8.0   Glucose, UA NEGATIVE NEGATIVE mg/dL   Hgb urine dipstick NEGATIVE NEGATIVE   Bilirubin Urine NEGATIVE NEGATIVE   Ketones, ur NEGATIVE NEGATIVE mg/dL   Protein, ur NEGATIVE NEGATIVE mg/dL   Nitrite NEGATIVE NEGATIVE   Leukocytes, UA NEGATIVE NEGATIVE     Comment: MICROSCOPIC NOT DONE ON URINES WITH NEGATIVE PROTEIN, BLOOD, LEUKOCYTES, NITRITE, OR GLUCOSE <1000 mg/dL.  CBC with Differential     Status: Abnormal   Collection Time: 03/04/16  8:45 PM  Result Value Ref Range   WBC 14.1 (H) 4.0 - 10.5 K/uL   RBC 4.75 4.22 - 5.81 MIL/uL   Hemoglobin 14.0 13.0 - 17.0 g/dL   HCT 42.1 39.0 - 52.0 %   MCV 88.6 78.0 - 100.0 fL   MCH 29.5 26.0 - 34.0 pg   MCHC 33.3 30.0 - 36.0 g/dL   RDW 14.1 11.5 - 15.5 %   Platelets 293 150 - 400 K/uL   Neutrophils Relative % 79 %   Neutro Abs 11.0 (H) 1.7 - 7.7 K/uL   Lymphocytes Relative 14 %   Lymphs Abs 2.0 0.7 - 4.0 K/uL   Monocytes Relative 7 %   Monocytes Absolute 1.0 0.1 - 1.0 K/uL   Eosinophils Relative 0 %   Eosinophils Absolute 0.1 0.0 - 0.7 K/uL   Basophils Relative 0 %   Basophils Absolute 0.0 0.0 - 0.1 K/uL  Comprehensive metabolic panel     Status: None   Collection Time: 03/04/16  8:45 PM  Result Value Ref Range   Sodium 139 135 - 145 mmol/L   Potassium 3.6 3.5 - 5.1 mmol/L   Chloride 102 101 - 111 mmol/L   CO2 29 22 - 32 mmol/L   Glucose, Bld 78 65 - 99 mg/dL   BUN 18 6 - 20 mg/dL   Creatinine, Ser 1.02 0.61 - 1.24 mg/dL   Calcium 9.2 8.9 - 10.3 mg/dL   Total Protein 7.4 6.5 - 8.1 g/dL   Albumin 4.0 3.5 - 5.0 g/dL   AST 19 15 - 41 U/L   ALT 22 17 - 63 U/L   Alkaline Phosphatase 57 38 - 126 U/L   Total Bilirubin 0.6 0.3 - 1.2 mg/dL   GFR calc non Af Amer >60 >60 mL/min   GFR calc Af Amer >60 >60 mL/min    Comment: (NOTE) The eGFR has been calculated using the CKD EPI equation. This calculation has not been validated in all clinical situations. eGFR's persistently <60 mL/min signify possible Chronic Kidney Disease.    Anion gap 8 5 - 15  Lipase, blood     Status: None   Collection Time: 03/04/16  8:45 PM  Result Value Ref Range   Lipase 24 11 - 51 U/L  I-Stat CG4 Lactic Acid, ED     Status: None   Collection Time: 03/04/16  8:54 PM  Result Value Ref Range   Lactic Acid,  Venous 0.61 0.5 - 1.9 mmol/L  CBC     Status: Abnormal   Collection Time: 03/05/16  5:50 AM  Result Value Ref Range   WBC 14.5 (H) 4.0 - 10.5 K/uL   RBC 4.32 4.22 - 5.81 MIL/uL   Hemoglobin 12.6 (L) 13.0 - 17.0 g/dL   HCT 38.5 (L) 39.0 - 52.0 %   MCV 89.1 78.0 - 100.0 fL   MCH 29.2 26.0 -  34.0 pg   MCHC 32.7 30.0 - 36.0 g/dL   RDW 14.1 11.5 - 15.5 %   Platelets 269 150 - 400 K/uL  Basic metabolic panel     Status: Abnormal   Collection Time: 03/05/16  5:50 AM  Result Value Ref Range   Sodium 141 135 - 145 mmol/L   Potassium 3.7 3.5 - 5.1 mmol/L   Chloride 108 101 - 111 mmol/L   CO2 28 22 - 32 mmol/L   Glucose, Bld 108 (H) 65 - 99 mg/dL   BUN 12 6 - 20 mg/dL   Creatinine, Ser 1.03 0.61 - 1.24 mg/dL   Calcium 8.3 (L) 8.9 - 10.3 mg/dL   GFR calc non Af Amer >60 >60 mL/min   GFR calc Af Amer >60 >60 mL/min    Comment: (NOTE) The eGFR has been calculated using the CKD EPI equation. This calculation has not been validated in all clinical situations. eGFR's persistently <60 mL/min signify possible Chronic Kidney Disease.    Anion gap 5 5 - 15  Basic metabolic panel     Status: Abnormal   Collection Time: 03/06/16  6:02 AM  Result Value Ref Range   Sodium 140 135 - 145 mmol/L   Potassium 3.8 3.5 - 5.1 mmol/L   Chloride 106 101 - 111 mmol/L   CO2 27 22 - 32 mmol/L   Glucose, Bld 91 65 - 99 mg/dL   BUN 10 6 - 20 mg/dL   Creatinine, Ser 1.02 0.61 - 1.24 mg/dL   Calcium 8.4 (L) 8.9 - 10.3 mg/dL   GFR calc non Af Amer >60 >60 mL/min   GFR calc Af Amer >60 >60 mL/min    Comment: (NOTE) The eGFR has been calculated using the CKD EPI equation. This calculation has not been validated in all clinical situations. eGFR's persistently <60 mL/min signify possible Chronic Kidney Disease.    Anion gap 7 5 - 15  CBC with Differential/Platelet     Status: Abnormal   Collection Time: 03/06/16  6:02 AM  Result Value Ref Range   WBC 12.6 (H) 4.0 - 10.5 K/uL   RBC 4.27 4.22 - 5.81 MIL/uL    Hemoglobin 12.2 (L) 13.0 - 17.0 g/dL   HCT 37.2 (L) 39.0 - 52.0 %   MCV 87.1 78.0 - 100.0 fL   MCH 28.6 26.0 - 34.0 pg   MCHC 32.8 30.0 - 36.0 g/dL   RDW 14.1 11.5 - 15.5 %   Platelets 253 150 - 400 K/uL   Neutrophils Relative % 78 %   Neutro Abs 9.7 (H) 1.7 - 7.7 K/uL   Lymphocytes Relative 13 %   Lymphs Abs 1.7 0.7 - 4.0 K/uL   Monocytes Relative 9 %   Monocytes Absolute 1.2 (H) 0.1 - 1.0 K/uL   Eosinophils Relative 0 %   Eosinophils Absolute 0.0 0.0 - 0.7 K/uL   Basophils Relative 0 %   Basophils Absolute 0.0 0.0 - 0.1 K/uL    Imaging / Studies: Ct Abdomen Pelvis W Contrast  Result Date: 03/04/2016 CLINICAL DATA:  Abdominal pain.  Suprapubic pain. EXAM: CT ABDOMEN AND PELVIS WITH CONTRAST TECHNIQUE: Multidetector CT imaging of the abdomen and pelvis was performed using the standard protocol following bolus administration of intravenous contrast. CONTRAST:  118m ISOVUE-300 IOPAMIDOL (ISOVUE-300) INJECTION 61% COMPARISON:  None. FINDINGS: Lower chest: The lung bases are clear. Hepatobiliary: No focal hepatic lesion. Probable dependent gallstones within physiologically distended gallbladder. No biliary dilatation. Pancreas: No ductal dilatation or inflammation. Spleen:  Normal in size without focal abnormality. Adrenals/Urinary Tract: Duplicated left renal collecting system. No hydronephrosis. Homogeneous symmetric enhancement and excretion on delayed phase imaging. Urinary bladder is minimally distended, displaced right anteriorly secondary to colonic inflammation. There is associated of bladder wall thickening. No intravesicular air. No adrenal nodule. Stomach/Bowel: Acute diverticulitis with fat stranding and pericolonic edema involving the mid sigmoid colon with small focus of extraluminal air extending anteriorly. Small amount of free fluid. No pericolonic abscess. Additional noninflamed diverticula within the descending an sigmoid colon. Moderate stool bowel burden proximally. No  small bowel inflammation. The appendix is normal. Vascular/Lymphatic: Abdominal aortic atherosclerosis without aneurysm. No retroperitoneal or pelvic adenopathy. Reproductive: Normal sized prostate gland. Other: Fat within the right inguinal canal. Tiny fat containing umbilical hernia. No upper abdominal ascites. No upper abdominal free air. Musculoskeletal: Degenerative disc disease and facet arthropathy at L4-L5 with associated anterolisthesis. There are no acute or suspicious osseous abnormalities. IMPRESSION: Acute sigmoid colonic diverticulitis with associated micro perforation. No abscess. Probable cholelithiasis, no gallbladder inflammation. These results were called by telephone at the time of interpretation on 03/04/2016 at 10:05 pm to Dr. Marda Stalker , who verbally acknowledged these results. Electronically Signed   By: Jeb Levering M.D.   On: 03/04/2016 22:05    Medications / Allergies: per chart  Antibiotics: Anti-infectives    Start     Dose/Rate Route Frequency Ordered Stop   03/05/16 0600  piperacillin-tazobactam (ZOSYN) IVPB 3.375 g     3.375 g 12.5 mL/hr over 240 Minutes Intravenous Every 8 hours 03/05/16 0344     03/04/16 2215  piperacillin-tazobactam (ZOSYN) IVPB 3.375 g     3.375 g 100 mL/hr over 30 Minutes Intravenous  Once 03/04/16 2212 03/04/16 2308        Note: Portions of this report may have been transcribed using voice recognition software. Every effort was made to ensure accuracy; however, inadvertent computerized transcription errors may be present.   Any transcriptional errors that result from this process are unintentional.     Adin Hector, M.D., F.A.C.S. Gastrointestinal and Minimally Invasive Surgery [facility] [address], [place], [place] [address] [phone] Main / Paging   03/06/2016

## 2016-03-06 NOTE — Progress Notes (Signed)
Austin Faulkner EQA:[id number] DOB: April 27, 1956 DOA: 03/04/2016 PCP: Pcp Not In System  Brief narrative: 13 ? htn Asthma Rh disease admitted with diverticular perf Gen sur consulted  Past medical history-As per Problem list Chart reviewed as below-   Consultants:  none  Procedures:  none  Antibiotics:  ZOsyn   Subjective  Fair tol clear ok No new issue   Objective    Interim History:   Telemetry:    Objective: Vitals:   03/05/16 2050 03/06/16 0656 03/06/16 0813 03/06/16 1300  BP: 111/74 (!) 107/59  123/80  Pulse: 70 88  (!) 50  Resp: 18 17  18   Temp: 98.8 F (37.1 C) 98.2 F (36.8 C)  98.5 F (36.9 C)  TempSrc: Oral Oral  Oral  SpO2: 100% 100% 100% 100%  Weight:      Height:        Intake/Output Summary (Last 24 hours) at 03/06/16 1548 Last data filed at 03/06/16 1430  Gross per 24 hour  Intake          4263.75 ml  Output             1825 ml  Net          2438.75 ml    Exam:  General: eomi ncat Cardiovascular: s1 s2 no m/r/g Respiratory: clear no adde dsoudn Abdomen: sfiot no rebound no gaurd Skin no le edmea Neuro intact  Data Reviewed: Basic Metabolic Panel:  Recent Labs Lab 03/04/16 2045 03/05/16 0550 03/06/16 0602  NA 139 141 140  K 3.6 3.7 3.8  CL 102 108 106  CO2 29 28 27   GLUCOSE 78 108* 91  BUN 18 12 10   CREATININE 1.02 1.03 1.02  CALCIUM 9.2 8.3* 8.4*   Liver Function Tests:  Recent Labs Lab 03/04/16 2045  AST 19  ALT 22  ALKPHOS 57  BILITOT 0.6  PROT 7.4  ALBUMIN 4.0    Recent Labs Lab 03/04/16 2045  LIPASE 24   No results for input(s): AMMONIA in the last 168 hours. CBC:  Recent Labs Lab 03/04/16 2045 03/05/16 0550 03/06/16 0602  WBC 14.1* 14.5* 12.6*  NEUTROABS 11.0*  --  9.7*  HGB 14.0 12.6* 12.2*  HCT 42.1 38.5* 37.2*  MCV 88.6 89.1 87.1  PLT 293 269 253   Cardiac Enzymes: No results for input(s): CKTOTAL, CKMB, CKMBINDEX, TROPONINI in the last 168 hours. BNP: Invalid  input(s): POCBNP CBG: No results for input(s): GLUCAP in the last 168 hours.  Recent Results (from the past 240 hour(s))  Blood culture (routine x 2)     Status: None (Preliminary result)   Collection Time: 03/04/16 10:25 PM  Result Value Ref Range Status   Specimen Description BLOOD RIGHT ANTECUBITAL  Final   Special Requests   Final    BOTTLES DRAWN AEROBIC AND ANAEROBIC AER 5cc ANA 5cc   Culture   Final    NO GROWTH 1 DAY Performed at [facility]    Report Status PENDING  Incomplete  Blood culture (routine x 2)     Status: None (Preliminary result)   Collection Time: 03/04/16 10:30 PM  Result Value Ref Range Status   Specimen Description BLOOD LEFT ANTECUBITAL  Final   Special Requests   Final    BOTTLES DRAWN AEROBIC AND ANAEROBIC AER 5cc ANA 5cc   Culture   Final    NO GROWTH 1 DAY Performed at [facility]    Report Status PENDING  Incomplete  Studies:              All Imaging reviewed and is as per above notation   Scheduled Meds: . enoxaparin (LOVENOX) injection  40 mg Subcutaneous Q24H  . hydrochlorothiazide  25 mg Oral Daily  . lip balm  1 application Topical BID  . mometasone-formoterol  2 puff Inhalation BID  . montelukast  10 mg Oral QHS  . multivitamin with minerals  1 tablet Oral Daily  . pantoprazole  40 mg Oral Daily  . piperacillin-tazobactam (ZOSYN)  IV  3.375 g Intravenous Q8H  . saccharomyces boulardii  250 mg Oral BID   Continuous Infusions:    Assessment/Plan:  1. Diverticular microperf-Needs OP management and possible sigmoid colectomy.  Cont current diet for now.  OP follow up gen surg-Abx for probable longer duraion ~ 14 days 2. Rh arthrtiis-cont diet and homeopathic meds as OP 3. Htncont HCTZ 4. Asthma-stable, cont inhalers  Likely d/c 48 hours if all well on abx   Pleas Koch, MD  [facility] Pager [phone] 03/06/2016, 3:48 PM    LOS: 2 days

## 2016-03-07 LAB — POTASSIUM: Potassium: 3.6 mmol/L (ref 3.5–5.1)

## 2016-03-07 LAB — CBC
HEMATOCRIT: 37.3 % — AB (ref 39.0–52.0)
HEMOGLOBIN: 12.4 g/dL — AB (ref 13.0–17.0)
MCH: 28.8 pg (ref 26.0–34.0)
MCHC: 33.2 g/dL (ref 30.0–36.0)
MCV: 86.5 fL (ref 78.0–100.0)
Platelets: 270 10*3/uL (ref 150–400)
RBC: 4.31 MIL/uL (ref 4.22–5.81)
RDW: 13.8 % (ref 11.5–15.5)
WBC: 10.7 10*3/uL — ABNORMAL HIGH (ref 4.0–10.5)

## 2016-03-07 MED ORDER — AMOXICILLIN-POT CLAVULANATE ER 1000-62.5 MG PO TB12
2.0000 | ORAL_TABLET | Freq: Two times a day (BID) | ORAL | Status: DC
  Administered 2016-03-07 – 2016-03-08 (×3): 2 via ORAL
  Filled 2016-03-07 (×3): qty 2

## 2016-03-07 MED ORDER — AMOXICILLIN-POT CLAVULANATE 875-125 MG PO TABS: 1.0000 | ORAL_TABLET | Freq: Two times a day (BID) | ORAL | 1 refills | Status: DC

## 2016-03-07 NOTE — Progress Notes (Signed)
Austin Faulkner EHM:[id number] DOB: 06/20/1955 DOA: 03/04/2016 PCP: Pcp Not In System  Brief narrative: 10 ? htn Asthma Rh disease admitted with diverticular perf Gen sur consulted Patient stabilizing  Past medical history-As per Problem list Chart reviewed as below-   Consultants:  none  Procedures:  none  Antibiotics:  ZOsyn   Subjective   Fair Diet has been advanced.  No new issues Passing flatus ++   Objective    Interim History:   Telemetry:    Objective: Vitals:   03/06/16 1300 03/06/16 2107 03/07/16 0606 03/07/16 0857  BP: 123/80 122/62 114/68   Pulse: (!) 50 87 (!) 49   Resp: 18 17 17    Temp: 98.5 F (36.9 C) 98.2 F (36.8 C) 98.1 F (36.7 C)   TempSrc: Oral Oral Oral   SpO2: 100% 99% 99% 98%  Weight:      Height:        Intake/Output Summary (Last 24 hours) at 03/07/16 1544 Last data filed at 03/06/16 1744  Gross per 24 hour  Intake              240 ml  Output              300 ml  Net              -60 ml    Exam:  General: eomi ncat Cardiovascular: s1 s2 no m/r/g Respiratory: clear no added sound Abdomen: soft no rebound no gaurding, slight distension Skin no le edmea Neuro intact  Data Reviewed: Basic Metabolic Panel:  Recent Labs Lab 03/04/16 2045 03/05/16 0550 03/06/16 0602 03/07/16 0533  NA 139 141 140  --   K 3.6 3.7 3.8 3.6  CL 102 108 106  --   CO2 29 28 27   --   GLUCOSE 78 108* 91  --   BUN 18 12 10   --   CREATININE 1.02 1.03 1.02  --   CALCIUM 9.2 8.3* 8.4*  --    Liver Function Tests:  Recent Labs Lab 03/04/16 2045  AST 19  ALT 22  ALKPHOS 57  BILITOT 0.6  PROT 7.4  ALBUMIN 4.0    Recent Labs Lab 03/04/16 2045  LIPASE 24   No results for input(s): AMMONIA in the last 168 hours. CBC:  Recent Labs Lab 03/04/16 2045 03/05/16 0550 03/06/16 0602 03/07/16 0533  WBC 14.1* 14.5* 12.6* 10.7*  NEUTROABS 11.0*  --  9.7*  --   HGB 14.0 12.6* 12.2* 12.4*  HCT 42.1 38.5* 37.2* 37.3*    MCV 88.6 89.1 87.1 86.5  PLT 293 269 253 270   Cardiac Enzymes: No results for input(s): CKTOTAL, CKMB, CKMBINDEX, TROPONINI in the last 168 hours. BNP: Invalid input(s): POCBNP CBG: No results for input(s): GLUCAP in the last 168 hours.  Recent Results (from the past 240 hour(s))  Blood culture (routine x 2)     Status: None (Preliminary result)   Collection Time: 03/04/16 10:25 PM  Result Value Ref Range Status   Specimen Description BLOOD RIGHT ANTECUBITAL  Final   Special Requests   Final    BOTTLES DRAWN AEROBIC AND ANAEROBIC AER 5cc ANA 5cc   Culture   Final    NO GROWTH 1 DAY Performed at [facility]    Report Status PENDING  Incomplete  Blood culture (routine x 2)     Status: None (Preliminary result)   Collection Time: 03/04/16 10:30 PM  Result Value Ref Range Status  Specimen Description BLOOD LEFT ANTECUBITAL  Final   Special Requests   Final    BOTTLES DRAWN AEROBIC AND ANAEROBIC AER 5cc ANA 5cc   Culture   Final    NO GROWTH 1 DAY Performed at [facility]    Report Status PENDING  Incomplete     Studies:              All Imaging reviewed and is as per above notation   Scheduled Meds: . amoxicillin-clavulanate  2 tablet Oral Q12H  . enoxaparin (LOVENOX) injection  40 mg Subcutaneous Q24H  . hydrochlorothiazide  25 mg Oral Daily  . lip balm  1 application Topical BID  . mometasone-formoterol  2 puff Inhalation BID  . montelukast  10 mg Oral QHS  . multivitamin with minerals  1 tablet Oral Daily  . pantoprazole  40 mg Oral Daily  . saccharomyces boulardii  250 mg Oral BID   Continuous Infusions:    Assessment/Plan:  1. Diverticular microperf-Needs OP management and possible sigmoid colectomy.  Cont current diet for now.  OP follow up gen surg-Abx changed to po Augmentin 2. Rh arthrtiis-cont diet and homeopathic meds as OP 3. Htn cont HCTZ 4. Asthma-stable, cont inhalers  Likely d/c 10/16 if all well for OP f/u gen  surgery   Pleas Koch, MD  [facility] Pager [phone] 03/07/2016, 3:44 PM    LOS: 3 days

## 2016-03-07 NOTE — Progress Notes (Signed)
[facility]  [address] [place], [place] [address] Phone: [phone] FAX: [phone]   [name] [id number] 15-May-1956  CARE TEAM:  PCP: Pcp Not In System  Outpatient Care Team: Patient Care Team: Pcp Not In System as PCP - General  Inpatient Treatment Team: Treatment Team: Attending Provider: Nita Sells, MD; Technician: Arlys John, NT; Rounding Team: Redmond Baseman, MD; Consulting Physician: Nolon Nations, MD; Registered Nurse: Delena Bali, RN; Registered Nurse: Bobbye Charleston, RN  Problem List:   Principal Problem:   Diverticulitis of colon with perforation Active Problems:   Moderate persistent asthma, uncomplicated   Essential hypertension   Rheumatoid arthritis of multiple sites without rheumatoid factor (HCC)           Assessment  Diverticulitis with perforation - improving clinically  Plan:  -adv diet to soft/bland -IV ABx.  -VTE prophylaxis- SCDs, etc -mobilize as tolerated to help recovery -If meets discharge criteria, transition to oral antibiotics (7 more days (Augmentin 875 PO BID usually better tolerated over cipro/flagyl)) with follow-up in [place] surgery to consider elective colonic resection and 6-8 weeks given complex attack of diverticulitis with perforation.  Tentative follow up with Dr. Marcello Moores or myself  Patient had colonoscopy done in the past year at the [facility] that apparently was underwhelming.  I encouraged him to try and get a copy of that report for follow-up in the office.  D/C patient from hospital when patient meets criteria (anticipate Mon 10/16):  Tolerating oral intake well Ambulating well Adequate pain control without IV medications Urinating  Having flatus Disposition planning in place   Austin Faulkner, M.D., F.A.C.S. Gastrointestinal and Minimally Invasive Surgery [facility] [address], [place], [place]  [address] [phone] Main / Paging   03/07/2016  Subjective:  Pain less Occasional bloating Hungry Wife at bedside  Objective:  Vital signs:  Vitals:   03/06/16 0813 03/06/16 1300 03/06/16 2107 03/07/16 0606  BP:  123/80 122/62 114/68  Pulse:  (!) 50 87 (!) 49  Resp:  _0 Temp:  98.5 F (36.9 C) 98.2 F (36.8 C) 98.1 F (36.7 C)  TempSrc:  Oral Oral Oral  SpO2: 100% 100% 99% 99%  Weight:      Height:        Last BM Date: 03/05/16  Intake/Output   Yesterday:  10/14 0701 - 10/15 0700 In: 600 [P.O.:600] Out: 700 [Urine:700] This shift:  No intake/output data recorded.  Bowel function:  Flatus: YES  BM:  No  Drain: (No drain)   Physical Exam:  General: Pt awake/alert/oriented x4 in No acute distress Eyes: PERRL, normal EOM.  Sclera clear.  No icterus Neuro: CN II-XII intact w/o focal sensory/motor deficits. Lymph: No head/neck/groin lymphadenopathy Psych:  No delerium/psychosis/paranoia HENT: Normocephalic, Mucus membranes moist.  No thrush Neck: Supple, No tracheal deviation Chest: No chest wall pain w good excursion CV:  Pulses intact.  Regular rhythm MS: Normal AROM mjr joints.  No obvious deformity Abdomen: Soft.  Nondistended.  Tenderness at LLQ - very mild now.  No evidence of peritonitis.  No incarcerated hernias. Ext:  SCDs BLE.  No mjr edema.  No cyanosis Skin: No petechiae / purpura  Results:   Labs: Results for orders placed or performed during the hospital encounter of 03/04/16 (from the past 48 hour(s))  Basic metabolic panel     Status: Abnormal   Collection Time: 03/06/16  6:02 AM  Result Value Ref Range   Sodium 140 135 - 145 mmol/L   Potassium 3.8 3.5 - 5.1 mmol/L   Chloride 106 101 - 111 mmol/L   CO2 27 22 - 32 mmol/L   Glucose, Bld 91 65 - 99 mg/dL   BUN 10 6 - 20 mg/dL   Creatinine, Ser 1.02 0.61 - 1.24 mg/dL   Calcium 8.4 (L) 8.9 - 10.3 mg/dL   GFR calc non Af Amer >60 >60 mL/min   GFR calc Af Amer >60 >60  mL/min    Comment: (NOTE) The eGFR has been calculated using the CKD EPI equation. This calculation has not been validated in all clinical situations. eGFR's persistently <60 mL/min signify possible Chronic Kidney Disease.    Anion gap 7 5 - 15  CBC with Differential/Platelet     Status: Abnormal   Collection Time: 03/06/16  6:02 AM  Result Value Ref Range   WBC 12.6 (H) 4.0 - 10.5 K/uL   RBC 4.27 4.22 - 5.81 MIL/uL   Hemoglobin 12.2 (L) 13.0 - 17.0 g/dL   HCT 37.2 (L) 39.0 - 52.0 %   MCV 87.1 78.0 - 100.0 fL   MCH 28.6 26.0 - 34.0 pg   MCHC 32.8 30.0 - 36.0 g/dL   RDW 14.1 11.5 - 15.5 %   Platelets 253 150 - 400 K/uL   Neutrophils Relative % 78 %   Neutro Abs 9.7 (H) 1.7 - 7.7 K/uL   Lymphocytes Relative 13 %   Lymphs Abs 1.7 0.7 - 4.0 K/uL   Monocytes Relative 9 %   Monocytes Absolute 1.2 (H) 0.1 - 1.0 K/uL   Eosinophils Relative 0 %   Eosinophils Absolute 0.0 0.0 - 0.7 K/uL   Basophils Relative 0 %   Basophils Absolute 0.0 0.0 - 0.1 K/uL  CBC     Status: Abnormal   Collection Time: 03/07/16  5:33 AM  Result Value Ref Range   WBC 10.7 (H) 4.0 - 10.5 K/uL   RBC 4.31 4.22 - 5.81 MIL/uL   Hemoglobin 12.4 (L) 13.0 - 17.0 g/dL   HCT 37.3 (L) 39.0 - 52.0 %   MCV 86.5 78.0 - 100.0 fL   MCH 28.8 26.0 - 34.0 pg   MCHC 33.2 30.0 - 36.0 g/dL   RDW 13.8 11.5 - 15.5 %   Platelets 270 150 - 400 K/uL  Potassium     Status: None   Collection Time: 03/07/16  5:33 AM  Result Value Ref Range   Potassium 3.6 3.5 - 5.1 mmol/L    Imaging / Studies: No results found.  Medications / Allergies: per chart  Antibiotics: Anti-infectives    Start     Dose/Rate Route Frequency Ordered Stop   03/07/16 1200  amoxicillin-clavulanate (AUGMENTIN XR) 1000-62.5 MG per 12 hr tablet 2 tablet     2 tablet Oral Every 12 hours 03/07/16 0744     03/05/16 0600  piperacillin-tazobactam (ZOSYN) IVPB 3.375 g  Status:  Discontinued     3.375 g 12.5 mL/hr over 240 Minutes Intravenous Every 8 hours  03/05/16 0344 03/07/16 0744   03/04/16 2215  piperacillin-tazobactam (ZOSYN) IVPB 3.375 g     3.375 g 100 mL/hr over 30 Minutes Intravenous  Once 03/04/16 2212 03/04/16 2308        Note: Portions of this report may have been transcribed using voice recognition software. Every effort was made to ensure accuracy; however, inadvertent computerized transcription errors may be present.   Any transcriptional errors that result  from this process are unintentional.     Austin Faulkner, M.D., F.A.C.S. Gastrointestinal and Minimally Invasive Surgery [facility] [address], [place], [place] [address] [phone] Main / Paging   03/07/2016

## 2016-03-07 NOTE — Discharge Instructions (Signed)
SURGERY: POST OP INSTRUCTIONS (Surgery for small bowel obstruction, colon resection, etc)   ######################################################################  EAT Gradually transition to a high fiber diet with a fiber supplement over the next few days after discharge  WALK Walk an hour a day.  Control your pain to do that.    CONTROL PAIN Control pain so that you can walk, sleep, tolerate sneezing/coughing, go up/down stairs.  HAVE A BOWEL MOVEMENT DAILY Keep your bowels regular to avoid problems.  OK to try a laxative to override constipation.  OK to use an antidairrheal to slow down diarrhea.  Call if not better after 2 tries  CALL IF YOU HAVE PROBLEMS/CONCERNS Call if you are still struggling despite following these instructions. Call if you have concerns not answered by these instructions  ######################################################################   DIET Follow a light diet the first few days at home.  Start with a bland diet such as soups, liquids, starchy foods, low fat foods, etc.  If you feel full, bloated, or constipated, stay on a ful liquid or pureed/blenderized diet for a few days until you feel better and no longer constipated. Be sure to drink plenty of fluids every day to avoid getting dehydrated (feeling dizzy, not urinating, etc.). Gradually add a fiber supplement to your diet over the next week.  Gradually get back to a regular solid diet.  Avoid fast food or heavy meals the first week as you are more likely to get nauseated. It is expected for your digestive tract to need a few months to get back to normal.  It is common for your bowel movements and stools to be irregular.  You will have occasional bloating and cramping that should eventually fade away.  Until you are eating solid food normally, off all pain medications, and back to regular activities; your bowels will not be normal. Focus on eating a low-fat, high fiber diet the rest of your life  (See Getting to Good Bowel Health, below).   ACTIVITIES as tolerated Start light daily activities --- self-care, walking, climbing stairs-- beginning the day after surgery.  Gradually increase activities as tolerated.  Control your pain to be active.  Stop when you are tired.  Ideally, walk several times a day, eventually an hour a day.   Most people are back to most day-to-day activities in a few weeks.  It takes 4-8 weeks to get back to unrestricted, intense activity. If you can walk 30 minutes without difficulty, it is safe to try more intense activity such as jogging, treadmill, bicycling, low-impact aerobics, swimming, etc. Save the most intensive and strenuous activity for last (Usually 4-8 weeks after surgery) such as sit-ups, heavy lifting, contact sports, etc.  Refrain from any intense heavy lifting or straining until you are off narcotics for pain control.  You will have off days, but things should improve week-by-week. DO NOT PUSH THROUGH PAIN.  Let pain be your guide: If it hurts to do something, don't do it.  Pain is your body warning you to avoid that activity for another week until the pain goes down. You may drive when you are no longer taking narcotic prescription pain medication, you can comfortably wear a seatbelt, and you can safely make sudden turns/stops to protect yourself without hesitating due to pain. You may have sexual intercourse when it is comfortable. If it hurts to do something, stop.  MEDICATIONS Take your usually prescribed home medications unless otherwise directed.   Blood thinners:  Usually you can restart any strong blood thinners  after the second postoperative day.  It is OK to take aspirin right away.     If you are on strong blood thinners (warfarin/Coumadin, Plavix, Xerelto, Eliquis, Pradaxa, etc), discuss with your surgeon, medicine PCP, and/or cardiologist for instructions on when to restart the blood thinner & if blood monitoring is needed (PT/INR blood  check, etc).     PAIN CONTROL Pain after surgery or related to activity is often due to strain/injury to muscle, tendon, nerves and/or incisions.  This pain is usually short-term and will improve in a few months.  To help speed the process of healing and to get back to regular activity more quickly, DO THE FOLLOWING THINGS TOGETHER: 1. Increase activity gradually.  DO NOT PUSH THROUGH PAIN 2. Use Ice and/or Heat 3. Try Gentle Massage and/or Stretching 4. Take over the counter pain medication 5. Take Narcotic prescription pain medication for more severe pain  Good pain control = faster recovery.  It is better to take more medicine to be more active than to stay in bed all day to avoid medications. 1.  Increase activity gradually Avoid heavy lifting at first, then increase to lifting as tolerated over the next 6 weeks. Do not push through the pain.  Listen to your body and avoid positions and maneuvers than reproduce the pain.  Wait a few days before trying something more intense Walking an hour a day is encouraged to help your body recover faster and more safely.  Start slowly and stop when getting sore.  If you can walk 30 minutes without stopping or pain, you can try more intense activity (running, jogging, aerobics, cycling, swimming, treadmill, sex, sports, weightlifting, etc.) Remember: If it hurts to do it, then dont do it! 2. Use Ice and/or Heat You will have swelling and bruising around the incisions.  This will take several weeks to resolve. Ice packs or heating pads (6-8 times a day, 30-60 minutes at a time) will help sooth soreness & bruising. Some people prefer to use ice alone, heat alone, or alternate between ice & heat.  Experiment and see what works best for you.  Consider trying ice for the first few days to help decrease swelling and bruising; then, switch to heat to help relax sore spots and speed recovery. Shower every day.  Short baths are fine.  It feels good!  Keep the  incisions and wounds clean with soap & water.   3. Try Gentle Massage and/or Stretching Massage at the area of pain many times a day Stop if you feel pain - do not overdo it 4. Take over the counter pain medication This helps the muscle and nerve tissues become less irritable and calm down faster Choose ONE of the following over-the-counter anti-inflammatory medications: Acetaminophen 500mg  tabs (Tylenol) 1-2 pills with every meal and just before bedtime (avoid if you have liver problems or if you have acetaminophen in you narcotic prescription) Naproxen 220mg  tabs (ex. Aleve, Naprosyn) 1-2 pills twice a day (avoid if you have kidney, stomach, IBD, or bleeding problems) Ibuprofen 200mg  tabs (ex. Advil, Motrin) 3-4 pills with every meal and just before bedtime (avoid if you have kidney, stomach, IBD, or bleeding problems) Take with food/snack several times a day as directed for at least 2 weeks to help keep pain / soreness down & more manageable. 5. Take Narcotic prescription pain medication for more severe pain A prescription for strong pain control is often given to you upon discharge (for example: oxycodone/Percocet, hydrocodone/Norco/Vicodin, or tramadol/Ultram) Take  your pain medication as prescribed. Be mindful that most narcotic prescriptions contain Tylenol (acetaminophen) as well - avoid taking too much Tylenol. If you are having problems/concerns with the prescription medicine (does not control pain, nausea, vomiting, rash, itching, etc.), please call us [phone] to see if we need to switch you to a different pain medicine that will work better for you and/or control your side effects better. If you need a refill on your pain medication, you must call the office before 4 pm and on weekdays only.  By federal law, prescriptions for narcotics cannot be called into a pharmacy.  They must be filled out on paper & picked up from our office by the patient or authorized caretaker.   Prescriptions cannot be filled after 4 pm nor on weekends.    WHEN TO CALL us [phone] Severe uncontrolled or worsening pain  Fever over 101 F (38.5 C) Concerns with the incision: Worsening pain, redness, rash/hives, swelling, bleeding, or drainage Reactions / problems with new medications (itching, rash, hives, nausea, etc.) Nausea and/or vomiting Difficulty urinating Difficulty breathing Worsening fatigue, dizziness, lightheadedness, blurred vision Other concerns If you are not getting better after two weeks or are noticing you are getting worse, contact our office [phone] for further advice.  We may need to adjust your medications, re-evaluate you in the office, send you to the emergency room, or see what other things we can do to help. The clinic staff is available to answer your questions during regular business hours (8:30am-5pm).  Please dont hesitate to call and ask to speak to one of our nurses for clinical concerns.    A surgeon from [facility] is always on call at the hospitals 24 hours/day If you have a medical emergency, go to the nearest emergency room or call 911.  FOLLOW UP in our office One the day of your discharge from the hospital (or the next business weekday), please call [facility] to set up or confirm an appointment to see your surgeon in the office for a follow-up appointment.  Usually it is 2-3 weeks after your surgery.   If you have skin staples at your incision(s), let the office know so we can set up a time in the office for the nurse to remove them (usually around 10 days after surgery). Make sure that you call for appointments the day of discharge (or the next business weekday) from the hospital to ensure a convenient appointment time. IF YOU HAVE DISABILITY OR FAMILY LEAVE FORMS, BRING THEM TO THE OFFICE FOR PROCESSING.  DO NOT GIVE THEM TO YOUR DOCTOR.  [facility] [address], [address],  [place], [place]  [address] ? [phone] - Main [phone] - Toll Free,  [phone] - Fax [email or web address]  GETTING TO GOOD BOWEL HEALTH. It is expected for your digestive tract to need a few months to get back to normal.  It is common for your bowel movements and stools to be irregular.  You will have occasional bloating and cramping that should eventually fade away.  Until you are eating solid food normally, off all pain medications, and back to regular activities; your bowels will not be normal.   Avoiding constipation The goal: ONE SOFT BOWEL MOVEMENT A DAY!    Drink plenty of fluids.  Choose water first. TAKE A FIBER SUPPLEMENT EVERY DAY THE REST OF YOUR LIFE During your first week back home, gradually add back a fiber supplement every day  Experiment which form you can tolerate.   There are many forms such as powders, tablets, wafers, gummies, etc Psyllium bran (Metamucil), methylcellulose (Citrucel), Miralax or Glycolax, Benefiber, Flax Seed.  Adjust the dose week-by-week (1/2 dose/day to 6 doses a day) until you are moving your bowels 1-2 times a day.  Cut back the dose or try a different fiber product if it is giving you problems such as diarrhea or bloating. Sometimes a laxative is needed to help jump-start bowels if constipated until the fiber supplement can help regulate your bowels.  If you are tolerating eating & you are farting, it is okay to try a gentle laxative such as double dose MiraLax, prune juice, or Milk of Magnesia.  Avoid using laxatives too often. Stool softeners can sometimes help counteract the constipating effects of narcotic pain medicines.  It can also cause diarrhea, so avoid using for too long. If you are still constipated despite taking fiber daily, eating solids, and a few doses of laxatives, call our office. Controlling diarrhea Try drinking liquids and eating bland foods for a few days to avoid stressing your intestines further. Avoid dairy  products (especially milk & ice cream) for a short time.  The intestines often can lose the ability to digest lactose when stressed. Avoid foods that cause gassiness or bloating.  Typical foods include beans and other legumes, cabbage, broccoli, and dairy foods.  Avoid greasy, spicy, fast foods.  Every person has some sensitivity to other foods, so listen to your body and avoid those foods that trigger problems for you. Probiotics (such as active yogurt, Align, etc) may help repopulate the intestines and colon with normal bacteria and calm down a sensitive digestive tract Adding a fiber supplement gradually can help thicken stools by absorbing excess fluid and retrain the intestines to act more normally.  Slowly increase the dose over a few weeks.  Too much fiber too soon can backfire and cause cramping & bloating. It is okay to try and slow down diarrhea with a few doses of antidiarrheal medicines.   Bismuth subsalicylate (ex. Kayopectate, Pepto Bismol) for a few doses can help control diarrhea.  Avoid if pregnant.   Loperamide (Imodium) can slow down diarrhea.  Start with one tablet (2mg ) first.  Avoid if you are having fevers or severe pain.  ILEOSTOMY PATIENTS WILL HAVE CHRONIC DIARRHEA since their colon is not in use.    Drink plenty of liquids.  You will need to drink even more glasses of water/liquid a day to avoid getting dehydrated. Record output from your ileostomy.  Expect to empty the bag every 3-4 hours at first.  Most people with a permanent ileostomy empty their bag 4-6 times at the least.   Use antidiarrheal medicine (especially Imodium) several times a day to avoid getting dehydrated.  Start with a dose at bedtime & breakfast.  Adjust up or down as needed.  Increase antidiarrheal medications as directed to avoid emptying the bag more than 8 times a day (every 3 hours). Work with your wound ostomy nurse to learn care for your ostomy.  See ostomy care instructions. TROUBLESHOOTING  IRREGULAR BOWELS 1) Start with a soft & bland diet. No spicy, greasy, or fried foods.  2) Avoid gluten/wheat or dairy products from diet to see if symptoms improve. 3) Miralax 17gm or flax seed mixed in 8oz. water or juice-daily. May use 2-4 times a day as needed. 4) Gas-X, Phazyme, etc. as needed for gas & bloating.  5) Prilosec (omeprazole) over-the-counter  as needed 6)  Consider probiotics (Align, Activa, etc) to help calm the bowels down  Call your doctor if you are getting worse or not getting better.  Sometimes further testing (cultures, endoscopy, X-ray studies, CT scans, bloodwork, etc.) may be needed to help diagnose and treat the cause of the diarrhea. [facility] [address], [address], [place], [place]  [id number] [phone] - Main.    [phone]  - Toll Free.   [phone] - Fax [email or web address]   Diverticulitis Diverticulitis is inflammation or infection of small pouches in your colon that form when you have a condition called diverticulosis. The pouches in your colon are called diverticula. Your colon, or large intestine, is where water is absorbed and stool is formed. Complications of diverticulitis can include:  Bleeding.  Severe infection.  Severe pain.  Perforation of your colon.  Obstruction of your colon. CAUSES  Diverticulitis is caused by bacteria. Diverticulitis happens when stool becomes trapped in diverticula. This allows bacteria to grow in the diverticula, which can lead to inflammation and infection. RISK FACTORS People with diverticulosis are at risk for diverticulitis. Eating a diet that does not include enough fiber from fruits and vegetables may make diverticulitis more likely to develop. SYMPTOMS  Symptoms of diverticulitis may include:  Abdominal pain and tenderness. The pain is normally located on the left side of the abdomen, but may occur in other areas.  Fever and  chills.  Bloating.  Cramping.  Nausea.  Vomiting.  Constipation.  Diarrhea.  Blood in your stool. DIAGNOSIS  Your health care provider will ask you about your medical history and do a physical exam. You may need to have tests done because many medical conditions can cause the same symptoms as diverticulitis. Tests may include:  Blood tests.  Urine tests.  Imaging tests of the abdomen, including X-rays and CT scans. When your condition is under control, your health care provider may recommend that you have a colonoscopy. A colonoscopy can show how severe your diverticula are and whether something else is causing your symptoms. TREATMENT  Most cases of diverticulitis are mild and can be treated at home. Treatment may include:  Taking over-the-counter pain medicines.  Following a clear liquid diet.  Taking antibiotic medicines by mouth for 7-10 days. More severe cases may be treated at a hospital. Treatment may include:  Not eating or drinking.  Taking prescription pain medicine.  Receiving antibiotic medicines through an IV tube.  Receiving fluids and nutrition through an IV tube.  Surgery. HOME CARE INSTRUCTIONS   Follow your health care provider's instructions carefully.  Follow a full liquid diet or other diet as directed by your health care provider. After your symptoms improve, your health care provider may tell you to change your diet. He or she may recommend you eat a high-fiber diet. Fruits and vegetables are good sources of fiber. Fiber makes it easier to pass stool.  Take fiber supplements or probiotics as directed by your health care provider.  Only take medicines as directed by your health care provider.  Keep all your follow-up appointments. SEEK MEDICAL CARE IF:   Your pain does not improve.  You have a hard time eating food.  Your bowel movements do not return to normal. SEEK IMMEDIATE MEDICAL CARE IF:   Your pain becomes worse.  Your  symptoms do not get better.  Your symptoms suddenly get worse.  You have a fever.  You have repeated vomiting.  You have bloody or  black, tarry stools. MAKE SURE YOU:   Understand these instructions.  Will watch your condition.  Will get help right away if you are not doing well or get worse.   This information is not intended to replace advice given to you by your health care provider. Make sure you discuss any questions you have with your health care provider.   Document Released: 02/17/2005 Document Revised: 05/15/2013 Document Reviewed: 04/04/2013 [employer] Interactive Patient Education [employer]

## 2016-03-08 LAB — CBC WITH DIFFERENTIAL/PLATELET
BASOS PCT: 0 %
Basophils Absolute: 0 10*3/uL (ref 0.0–0.1)
EOS ABS: 0.1 10*3/uL (ref 0.0–0.7)
Eosinophils Relative: 1 %
HCT: 38.9 % — ABNORMAL LOW (ref 39.0–52.0)
HEMOGLOBIN: 12.9 g/dL — AB (ref 13.0–17.0)
LYMPHS ABS: 1.9 10*3/uL (ref 0.7–4.0)
Lymphocytes Relative: 20 %
MCH: 28.7 pg (ref 26.0–34.0)
MCHC: 33.2 g/dL (ref 30.0–36.0)
MCV: 86.6 fL (ref 78.0–100.0)
Monocytes Absolute: 0.9 10*3/uL (ref 0.1–1.0)
Monocytes Relative: 10 %
NEUTROS PCT: 69 %
Neutro Abs: 6.5 10*3/uL (ref 1.7–7.7)
Platelets: 269 10*3/uL (ref 150–400)
RBC: 4.49 MIL/uL (ref 4.22–5.81)
RDW: 13.7 % (ref 11.5–15.5)
WBC: 9.4 10*3/uL (ref 4.0–10.5)

## 2016-03-08 MED ORDER — OXYCODONE HCL 5 MG PO TABS: 5.0000 mg | ORAL_TABLET | ORAL | 0 refills | Status: DC | PRN

## 2016-03-08 MED ORDER — ACETAMINOPHEN 325 MG PO TABS: 325.0000 mg | ORAL_TABLET | Freq: Four times a day (QID) | ORAL | Status: DC | PRN

## 2016-03-08 MED ORDER — AMOXICILLIN-POT CLAVULANATE 875-125 MG PO TABS: 1.0000 | ORAL_TABLET | Freq: Two times a day (BID) | ORAL | 1 refills | Status: DC

## 2016-03-08 NOTE — Discharge Summary (Signed)
Physician Discharge Summary  Austin Faulkner:[id number] DOB: 03/26/1956 DOA: 03/04/2016  PCP: Pcp Not In System  Admit date: 03/04/2016 Discharge date: 03/08/2016  Time spent: 25 minutes  Recommendations for Outpatient Follow-up:  1. complete Rx for Diverticulitis and perf with augmentin 03/17/16 2. Needs discussion re: Designer, industrial/product by either a Estate manager/land agent or Dr. Glorious Peach as OP  Discharge Diagnoses:  Principal Problem:   Diverticulitis of colon with perforation Active Problems:   Moderate persistent asthma, uncomplicated   Essential hypertension   Rheumatoid arthritis of multiple sites without rheumatoid factor (HCC)   Discharge Condition: improved  Diet recommendation:  Soft bland for 1-2 days  Filed Weights   03/04/16 1913 03/05/16 0111  Weight: 92.5 kg (204 lb) 98 kg (216 lb 0.8 oz)    History of present illness:  59 ? htn Asthma Rh disease  Admitted 03/05/16 with diverticular perf Gen sur consulted Patient stabilizing  Hospital Course:    1. Diverticular microperf-Needs OP management and possible sigmoid colectomy.  Cont current diet for now.  OP follow up gen surg-Abx changed to po Augmentin--complete on 10/25 2. Rh arthrtiis-cont diet and homeopathic meds as OP 3. Htn cont HCTZ 4. Asthma-stable, cont inhalers    Consultations:  Gen surg  Discharge Exam: Vitals:   03/07/16 2027 03/08/16 0536  BP: 123/78 121/82  Pulse: (!) 54 (!) 48  Resp: 16 18  Temp: 99 F (37.2 C) 98.4 F (36.9 C)    General: eomincat Cardiovascular: s1 s2 no m/r/g Respiratory: clear no added sound  Discharge Instructions   Discharge Instructions    Diet - low sodium heart healthy    Complete by:  As directed    Discharge instructions    Complete by:  As directed    Complete course of augmentin We have rx for you pain medications--use tylenol as a first choice and oxycodone as a second for unrelieved pain Please see either a [facility] or Dr. Michaell Cowing or Maisie Fus  within 1 month to discuss your need for a sigmoid colectomy   Increase activity slowly    Complete by:  As directed      Current Discharge Medication List    START taking these medications   Details  acetaminophen (TYLENOL) 325 MG tablet Take 1-2 tablets (325-650 mg total) by mouth every 6 (six) hours as needed for fever, headache, mild pain or moderate pain.    amoxicillin-clavulanate (AUGMENTIN) 875-125 MG tablet Take 1 tablet by mouth 2 (two) times daily. Qty: 18 tablet, Refills: 1    oxyCODONE (OXY IR/ROXICODONE) 5 MG immediate release tablet Take 1 tablet (5 mg total) by mouth every 4 (four) hours as needed for moderate pain or severe pain. Qty: 15 tablet, Refills: 0      CONTINUE these medications which have NOT CHANGED   Details  albuterol (PROVENTIL HFA) 108 (90 BASE) MCG/ACT inhaler Inhale 2 puffs into the lungs every 6 (six) hours as needed.    budesonide-formoterol (SYMBICORT) 80-4.5 MCG/ACT inhaler Inhale 2 puffs into the lungs every morning.    hydrochlorothiazide (HYDRODIURIL) 25 MG tablet Take 25 mg by mouth daily.    loratadine (CLARITIN) 10 MG tablet Take 10 mg by mouth daily as needed for allergies.     montelukast (SINGULAIR) 10 MG tablet Take 10 mg by mouth at bedtime.    Multiple Vitamin (MULTIVITAMIN WITH MINERALS) TABS tablet Take 1 tablet by mouth daily.    omeprazole (PRILOSEC) 20 MG capsule Take 20 mg by mouth daily.  TURMERIC CURCUMIN PO Take 1 tablet by mouth daily.      STOP taking these medications     ibuprofen (ADVIL,MOTRIN) 800 MG tablet        No Known Allergies Follow-up Information    [facility], [place]. Schedule an appointment as soon as possible for a visit in 2 week(s).   Specialty:  General Surgery Why:  f/u Dr Maisie Fus or Gross to consider electibve sigmoid colon resection of diverticulitis Contact information: [address] [place] [address] [phone]           The  results of significant diagnostics from this hospitalization (including imaging, microbiology, ancillary and laboratory) are listed below for reference.    Significant Diagnostic Studies: Ct Abdomen Pelvis W Contrast  Result Date: 03/04/2016 CLINICAL DATA:  Abdominal pain.  Suprapubic pain. EXAM: CT ABDOMEN AND PELVIS WITH CONTRAST TECHNIQUE: Multidetector CT imaging of the abdomen and pelvis was performed using the standard protocol following bolus administration of intravenous contrast. CONTRAST:  ISOVUE-300 IOPAMIDOL (ISOVUE-300) INJECTION 61% COMPARISON:  None. FINDINGS: Lower chest: The lung bases are clear. Hepatobiliary: No focal hepatic lesion. Probable dependent gallstones within physiologically distended gallbladder. No biliary dilatation. Pancreas: No ductal dilatation or inflammation. Spleen: Normal in size without focal abnormality. Adrenals/Urinary Tract: Duplicated left renal collecting system. No hydronephrosis. Homogeneous symmetric enhancement and excretion on delayed phase imaging. Urinary bladder is minimally distended, displaced right anteriorly secondary to colonic inflammation. There is associated of bladder wall thickening. No intravesicular air. No adrenal nodule. Stomach/Bowel: Acute diverticulitis with fat stranding and pericolonic edema involving the mid sigmoid colon with small focus of extraluminal air extending anteriorly. Small amount of free fluid. No pericolonic abscess. Additional noninflamed diverticula within the descending an sigmoid colon. Moderate stool bowel burden proximally. No small bowel inflammation. The appendix is normal. Vascular/Lymphatic: Abdominal aortic atherosclerosis without aneurysm. No retroperitoneal or pelvic adenopathy. Reproductive: Normal sized prostate gland. Other: Fat within the right inguinal canal. Tiny fat containing umbilical hernia. No upper abdominal ascites. No upper abdominal free air. Musculoskeletal: Degenerative disc disease and  facet arthropathy at L4-L5 with associated anterolisthesis. There are no acute or suspicious osseous abnormalities. IMPRESSION: Acute sigmoid colonic diverticulitis with associated micro perforation. No abscess. Probable cholelithiasis, no gallbladder inflammation. These results were called by telephone at the time of interpretation on 03/04/2016 at 10:05 pm to Dr. Lynden Oxford , who verbally acknowledged these results. Electronically Signed   By: Rubye Oaks M.D.   On: 03/04/2016 22:05    Microbiology: Recent Results (from the past 240 hour(s))  Blood culture (routine x 2)     Status: None (Preliminary result)   Collection Time: 03/04/16 10:25 PM  Result Value Ref Range Status   Specimen Description BLOOD RIGHT ANTECUBITAL  Final   Special Requests   Final    BOTTLES DRAWN AEROBIC AND ANAEROBIC AER 5cc ANA 5cc   Culture   Final    NO GROWTH 2 DAYS Performed at [facility]    Report Status PENDING  Incomplete  Blood culture (routine x 2)     Status: None (Preliminary result)   Collection Time: 03/04/16 10:30 PM  Result Value Ref Range Status   Specimen Description BLOOD LEFT ANTECUBITAL  Final   Special Requests   Final    BOTTLES DRAWN AEROBIC AND ANAEROBIC AER 5cc ANA 5cc   Culture   Final    NO GROWTH 2 DAYS Performed at [facility]    Report Status  PENDING  Incomplete     Labs: Basic Metabolic Panel:  Recent Labs Lab 03/04/16 2045 03/05/16 0550 03/06/16 0602 03/07/16 0533  NA 139 141 140  --   K 3.6 3.7 3.8 3.6  CL 102 108 106  --   CO2 29 28 27   --   GLUCOSE 78 108* 91  --   BUN 18 12 10   --   CREATININE 1.02 1.03 1.02  --   CALCIUM 9.2 8.3* 8.4*  --    Liver Function Tests:  Recent Labs Lab 03/04/16 2045  AST 19  ALT 22  ALKPHOS 57  BILITOT 0.6  PROT 7.4  ALBUMIN 4.0    Recent Labs Lab 03/04/16 2045  LIPASE 24   No results for input(s): AMMONIA in the last 168 hours. CBC:  Recent Labs Lab 03/04/16 2045  03/05/16 0550 03/06/16 0602 03/07/16 0533 03/08/16 0506  WBC 14.1* 14.5* 12.6* 10.7* 9.4  NEUTROABS 11.0*  --  9.7*  --  6.5  HGB 14.0 12.6* 12.2* 12.4* 12.9*  HCT 42.1 38.5* 37.2* 37.3* 38.9*  MCV 88.6 89.1 87.1 86.5 86.6  PLT 293 269 253 270 269   Cardiac Enzymes: No results for input(s): CKTOTAL, CKMB, CKMBINDEX, TROPONINI in the last 168 hours. BNP: BNP (last 3 results) No results for input(s): BNP in the last 8760 hours.  ProBNP (last 3 results) No results for input(s): PROBNP in the last 8760 hours.  CBG: No results for input(s): GLUCAP in the last 168 hours.     Signed10/17/17 MD   [facility] 03/08/2016, 8:34 AM

## 2016-03-08 NOTE — Progress Notes (Signed)
Progress note:   See that the patient has been discharged from the hospital and agree with primary providers assessment and plan. Clinically improved, having bowel function, and tolerated soft/bland diet.  Discharge on Augmentin 875 x 7 days with plans to follow up with Dr. Maisie Fus or Dr. Michaell Cowing in 6-8 weeks to discuss elective colonic resection.  Hosie Spangle, PA-C [facility] Pager: [phone] Consults: [phone] Mon-Fri 7:00 am-4:30 pm Sat-Sun 7:00 am-11:30 am

## 2016-03-08 NOTE — Progress Notes (Signed)
Pt dicharged from the unit via the RN. Pt discharge instructions were reviewed with the pt. No questions or concerns at this time.  Seddrick Flax W Lory Nowaczyk, RN

## 2016-03-10 LAB — CULTURE, BLOOD (ROUTINE X 2)
CULTURE: NO GROWTH
Culture: NO GROWTH

## 2016-03-23 ENCOUNTER — Other Ambulatory Visit: Payer: Self-pay | Admitting: General Surgery

## 2016-03-23 NOTE — H&P (Signed)
History of Present Illness Austin Levee MD; 03/23/2016 10:32 AM) The patient is a 60 year old male who presents with diverticulitis. 60 year old male with a recent hospitalization for diverticulitis with microperforation. His symptoms have resolved for the most part. He still has some pain with urination. He is having regular bowel movements and tolerating a diet without much difficulty. He denies any fevers. He denies any nausea. He has had a recent colonoscopy at the [place].   Problem List/Past Medical Austin Levee, MD; 03/23/2016 10:32 AM) DIVERTICULITIS OF INTESTINE WITH PERFORATION WITHOUT BLEEDING, UNSPECIFIED PART OF INTESTINAL TRACT (K57.80)  Other Problems Austin Levee, MD; 03/23/2016 10:32 AM) Gastroesophageal Reflux Disease Heart murmur Hemorrhoids Asthma Back Pain Chronic Obstructive Lung Disease DYSURIA (R30.0) Anxiety Disorder Arthritis  Past Surgical History Austin Levee, MD; 03/23/2016 10:32 AM) Shoulder Surgery Left. Knee Surgery Bilateral. Colon Polyp Removal - Colonoscopy  Diagnostic Studies History Austin Levee, MD; 03/23/2016 10:32 AM) Colonoscopy 1-5 years ago  Allergies Dorris Fetch, CMA; 03/23/2016 9:10 AM) No Known Drug Allergies 03/23/2016  Medication History Austin Levee, MD; 03/23/2016 10:32 AM) OxyCODONE HCl (5MG  Tablet, Oral) Active. Amoxicillin-Pot Clavulanate (875-125MG  Tablet, Oral) Active. Tylenol (325MG  Tablet, Oral) Active. Proventil HFA (108 (90 Base)MCG/ACT Aerosol Soln, Inhalation) Active. Symbicort (80-4.5MCG/ACT Aerosol, Inhalation) Active. HydroCHLOROthiazide (25MG  Tablet, Oral) Active. Claritin (10MG  Tablet, Oral) Active. Singulair (10MG  Tablet, Oral) Active. Multivitamin Adult (Oral) Active. Omeprazole (20MG  Capsule DR, Oral) Active. Turmeric (450MG  Capsule, Oral) Active. Medications Reconciled Neomycin Sulfate (500MG  Tablet, 2 (two) Tablet Oral SEE NOTE, Taken starting 03/23/2016)  Active. (TAKE TWO TABLETS AT 2 PM, 3 PM, AND 10 PM THE DAY PRIOR TO SURGERY) Flagyl (500MG  Tablet, 2 (two) Tablet Oral SEE NOTE, Taken starting 03/23/2016) Active. (Take at 2pm, 3pm, and 10pm the day prior to your colon operation)  Social History , MD; 03/23/2016 10:32 AM) Tobacco use Never smoker. Alcohol use Moderate alcohol use. Caffeine use Coffee, Tea. No drug use  Family History , MD; 03/23/2016 10:32 AM) Alcohol Abuse Mother. Arthritis Mother. Respiratory Condition Mother. Cancer Mother, Sister. Hypertension Father.     Review of Systems MD; 03/23/2016 10:32 AM) General Not Present- Appetite Loss, Chills, Fatigue, Fever, Night Sweats, Weight Gain and Weight Loss. Skin Not Present- Change in Wart/Mole, Dryness, Hives, Jaundice, New Lesions, Non-Healing Wounds, Rash and Ulcer. HEENT Present- Wears glasses/contact lenses. Not Present- Earache, Hearing Loss, Hoarseness, Nose Bleed, Oral Ulcers, Ringing in the Ears, Seasonal Allergies, Sinus Pain, Sore Throat, Visual Disturbances and Yellow Eyes. Respiratory Not Present- Bloody sputum, Chronic Cough, Difficulty Breathing, Snoring and Wheezing. Breast Not Present- Breast Mass, Breast Pain, Nipple Discharge and Skin Changes. Cardiovascular Not Present- Chest Pain, Difficulty Breathing Lying Down, Leg Cramps, Palpitations, Rapid Heart Rate, Shortness of Breath and Swelling of Extremities. Gastrointestinal Present- Abdominal Pain and Hemorrhoids. Not Present- Bloating, Bloody Stool, Change in Bowel Habits, Chronic diarrhea, Constipation, Difficulty Swallowing, Excessive gas, Gets full quickly at meals, Indigestion, Nausea, Rectal Pain and Vomiting. Male Genitourinary Present- Nocturia and Painful Urination. Not Present- Blood in Urine, Change in Urinary Stream, Frequency, Impotence, Urgency and Urine Leakage. Neurological Not Present- Decreased Memory, Fainting, Headaches, Numbness,  Seizures, Tingling, Tremor, Trouble walking and Weakness. Psychiatric Present- Anxiety. Not Present- Bipolar, Change in Sleep Pattern, Depression, Fearful and Frequent crying. Endocrine Not Present- Cold Intolerance, Excessive Hunger, Hair Changes, Heat Intolerance and New Diabetes. Hematology Not Present- Blood Thinners, Easy Bruising, Excessive bleeding, Gland problems, HIV and Persistent Infections.     Physical Exam 03/25/2016 MD; 03/23/2016 10:38  AM)  General Mental Status-Alert. General Appearance-Not in acute distress. Build & Nutrition-Well nourished. Posture-Normal posture. Gait-Normal.  Head and Neck Head-normocephalic, atraumatic with no lesions or palpable masses. Trachea-midline.  Chest and Lung Exam Chest and lung exam reveals -on auscultation, normal breath sounds, no adventitious sounds and normal vocal resonance.  Cardiovascular Cardiovascular examination reveals -normal heart sounds, regular rate and rhythm with no murmurs and no digital clubbing, cyanosis, edema, increased warmth or tenderness.  Abdomen Inspection Inspection of the abdomen reveals - No Hernias. Palpation/Percussion Palpation and Percussion of the abdomen reveal - Soft, Non Tender, No Rigidity (guarding), No hepatosplenomegaly and No Palpable abdominal masses.  Neurologic Neurologic evaluation reveals -alert and oriented x 3 with no impairment of recent or remote memory, normal attention span and ability to concentrate, normal sensation and normal coordination.  Musculoskeletal Normal Exam - Bilateral-Upper Extremity Strength Normal and Lower Extremity Strength Normal.    Assessment & Plan Austin Levee MD; 03/23/2016 9:31 AM)  DIVERTICULITIS OF INTESTINE WITH PERFORATION WITHOUT BLEEDING, UNSPECIFIED PART OF INTESTINAL TRACT (K57.80) Impression: 60 year old male who presents to the office for hospital follow-up after he was noted to have diverticulitis with  microperforation. He was treated with antibiotics IV and by mouth. His symptoms are resolving. He is having some dysuria. We will check a UA and make sure he does not have a UTI. If he does seem may have a colovesicular fistula. This can be treated with antibiotics. We will tentatively plan for a surgery in mid January to remove the diseased portion of his colon. The surgery and anatomy were described to the patient as well as the risks of surgery and the possible complications. These include: Bleeding, deep abdominal infections and possible wound complications such as hernia and infection, damage to adjacent structures, leak of surgical connections, which can lead to other surgeries and possibly an ostomy, possible need for other procedures, such as abscess drains in radiology, possible prolonged hospital stay, possible diarrhea from removal of part of the colon, possible constipation from narcotics, possible bowel, bladder or sexual dysfunction if having rectal surgery, prolonged fatigue/weakness or appetite loss, possible early recurrence of of disease, possible complications of their medical problems such as heart disease or arrhythmias or lung problems, death (less than 1%). I believe the patient understands and wishes to proceed with the surgery.

## 2016-06-14 ENCOUNTER — Other Ambulatory Visit: Payer: Self-pay | Admitting: General Surgery

## 2016-06-14 NOTE — H&P (Signed)
History of Present Illness Romie Levee MD; 06/14/2016 4:00 PM) The patient is a 61 year old male who presents with diverticulitis. 61 year old male with a recent hospitalization for diverticulitis with microperforation. His symptoms have resolved for the most part. He still has some pain with urination. He is having regular bowel movements and tolerating a diet without much difficulty. He denies any fevers. He denies any nausea. He has had a recent colonoscopy at the [place] which was normal.   Problem List/Past Medical Romie Levee, MD; 06/14/2016 4:00 PM) DIVERTICULITIS OF INTESTINE WITH PERFORATION WITHOUT BLEEDING, UNSPECIFIED PART OF INTESTINAL TRACT (K57.80)  Past Surgical History Romie Levee, MD; 06/14/2016 4:00 PM) Shoulder Surgery Left. Knee Surgery Bilateral. Colon Polyp Removal - Colonoscopy  Diagnostic Studies History Romie Levee, MD; 06/14/2016 4:00 PM) Colonoscopy 1-5 years ago  Allergies Barron Alvine [place], CMA; 06/14/2016 3:47 PM) No Known Drug Allergies 03/23/2016  Medication History Timmothy Euler, [place]; 06/14/2016 3:47 PM) Neomycin Sulfate (500MG  Tablet, 2 (two) Oral SEE NOTE, Taken starting 03/23/2016) Active. (TAKE TWO TABLETS AT 2 PM, 3 PM, AND 10 PM THE DAY PRIOR TO SURGERY) Flagyl (500MG  Tablet, 2 (two) Oral SEE NOTE, Taken starting 03/23/2016) Active. (Take at 2pm, 3pm, and 10pm the day prior to your colon operation) Bactrim DS (800-160MG  Tablet, 1 (one) Tablet Oral two times daily, Taken starting 05/19/2016) Active. OxyCODONE HCl (5MG  Tablet, Oral) Active. Amoxicillin-Pot Clavulanate (875-125MG  Tablet, Oral) Active. Tylenol (325MG  Tablet, Oral) Active. Proventil HFA (108 (90 Base)MCG/ACT Aerosol Soln, Inhalation) Active. Symbicort (80-4.5MCG/ACT Aerosol, Inhalation) Active. HydroCHLOROthiazide (25MG  Tablet, Oral) Active. Claritin (10MG  Tablet, Oral) Active. Singulair (10MG  Tablet, Oral) Active. Multivitamin Adult (Oral) Active. Omeprazole  (20MG  Capsule DR, Oral) Active. Turmeric (450MG  Capsule, Oral) Active. Medications Reconciled  Social History 03/25/2016, MD; 06/14/2016 4:00 PM) Tobacco use Never smoker. Alcohol use Moderate alcohol use. Caffeine use Coffee, Tea. No drug use  Family History , MD; 06/14/2016 4:00 PM) Alcohol Abuse Mother. Arthritis Mother. Respiratory Condition Mother. Cancer Mother, Sister. Hypertension Father.  Other Problems , MD; 06/14/2016 4:00 PM) Gastroesophageal Reflux Disease Heart murmur Hemorrhoids Asthma Back Pain Chronic Obstructive Lung Disease Anxiety Disorder Arthritis DYSURIA (R30.0)     Review of Systems MD; 06/14/2016 4:01 PM) General Not Present- Appetite Loss, Chills, Fatigue, Fever, Night Sweats, Weight Gain and Weight Loss. Skin Not Present- Change in Wart/Mole, Dryness, Hives, Jaundice, New Lesions, Non-Healing Wounds, Rash and Ulcer. HEENT Present- Wears glasses/contact lenses. Not Present- Earache, Hearing Loss, Hoarseness, Nose Bleed, Oral Ulcers, Ringing in the Ears, Seasonal Allergies, Sinus Pain, Sore Throat, Visual Disturbances and Yellow Eyes. Respiratory Not Present- Bloody sputum, Chronic Cough, Difficulty Breathing, Snoring and Wheezing. Breast Not Present- Breast Mass, Breast Pain, Nipple Discharge and Skin Changes. Cardiovascular Not Present- Chest Pain, Difficulty Breathing Lying Down, Leg Cramps, Palpitations, Rapid Heart Rate, Shortness of Breath and Swelling of Extremities. Gastrointestinal Present- Abdominal Pain and Hemorrhoids. Not Present- Bloating, Bloody Stool, Change in Bowel Habits, Chronic diarrhea, Constipation, Difficulty Swallowing, Excessive gas, Gets full quickly at meals, Indigestion, Nausea, Rectal Pain and Vomiting. Male Genitourinary Present- Nocturia and Painful Urination. Not Present- Blood in Urine, Change in Urinary Stream, Frequency, Impotence, Urgency and Urine  Leakage. Neurological Not Present- Decreased Memory, Fainting, Headaches, Numbness, Seizures, Tingling, Tremor, Trouble walking and Weakness. Psychiatric Present- Anxiety. Not Present- Bipolar, Change in Sleep Pattern, Depression, Fearful and Frequent crying. Endocrine Not Present- Cold Intolerance, Excessive Hunger, Hair Changes, Heat Intolerance and New Diabetes. Hematology Not Present- Blood Thinners, Easy Bruising, Excessive bleeding, Gland problems, HIV  and Persistent Infections.  Vitals Barron Alvine [place] CMA; 06/14/2016 3:48 PM) 06/14/2016 3:47 PM Weight: 211 lb Height: 70in Body Surface Area: 2.14 m Body Mass Index: 30.28 kg/m  Temp.: 98.75F  Pulse: 65 (Regular)  BP: 142/72 (Sitting, Left Arm, Standard)      Physical Exam Romie Levee MD; 06/14/2016 4:01 PM)  General Mental Status-Alert. General Appearance-Not in acute distress. Build & Nutrition-Well nourished. Posture-Normal posture. Gait-Normal.  Head and Neck Head-normocephalic, atraumatic with no lesions or palpable masses. Trachea-midline.  Chest and Lung Exam Chest and lung exam reveals -on auscultation, normal breath sounds, no adventitious sounds and normal vocal resonance.  Cardiovascular Cardiovascular examination reveals -normal heart sounds, regular rate and rhythm with no murmurs and no digital clubbing, cyanosis, edema, increased warmth or tenderness.  Abdomen Inspection Inspection of the abdomen reveals - No Hernias. Palpation/Percussion Palpation and Percussion of the abdomen reveal - Soft, Non Tender, No Rigidity (guarding), No hepatosplenomegaly and No Palpable abdominal masses.  Neurologic Neurologic evaluation reveals -alert and oriented x 3 with no impairment of recent or remote memory, normal attention span and ability to concentrate, normal sensation and normal coordination.  Musculoskeletal Normal Exam - Bilateral-Upper Extremity Strength Normal and Lower  Extremity Strength Normal.    Assessment & Plan Romie Levee MD; 06/14/2016 4:00 PM)  DIVERTICULITIS OF INTESTINE WITH PERFORATION WITHOUT BLEEDING, UNSPECIFIED PART OF INTESTINAL TRACT (K57.80) Impression: DIVERTICULITIS OF INTESTINE WITH PERFORATION WITHOUT BLEEDING, UNSPECIFIED PART OF INTESTINAL TRACT (K57.80) Impression: 61 year old male who presents to the office for hospital follow-up after he was noted to have diverticulitis with microperforation. He was treated with antibiotics IV and by mouth in Sept 17. His symptoms have mostly resolved. He is having some dysuria. He is ready for surgery in mid January to remove the diseased portion of his colon. All questions were answered. CT from [place] reviewed and shows a midline adhesion to the posterior dome of the bladder. The surgery and anatomy were described to the patient as well as the risks of surgery and the possible complications. These include: Bleeding, deep abdominal infections and possible wound complications such as hernia and infection, damage to adjacent structures, leak of surgical connections, which can lead to other surgeries and possibly an ostomy, possible need for other procedures, such as abscess drains in radiology, possible prolonged hospital stay, possible diarrhea from removal of part of the colon, possible constipation from narcotics, possible bowel, bladder or sexual dysfunction if having rectal surgery, prolonged fatigue/weakness or appetite loss, possible early recurrence of of disease, possible complications of their medical problems such as heart disease or arrhythmias or lung problems, death (less than 1%). I believe the patient understands and wishes to proceed with the surgery.

## 2016-06-18 NOTE — Patient Instructions (Addendum)
Austin Faulkner  06/18/2016   Your procedure is scheduled on: 06-23-16  Report to [facility] Main  Entrance take [facility]  elevators to 3rd floor to  Short Stay Center at  630 AM.  Call this number if you have problems the morning of surgery [phone]   Remember: ONLY 1 PERSON MAY GO WITH YOU TO SHORT STAY TO GET  READY MORNING OF YOUR SURGERY.  Do not eat food  :After Midnight MONDAY NIGHT, CLEAR LIQUIDS ALL DAY Tuesday PER DR THOMAS INSTRUCTIONS, DRINK PLENTY OF CLEAR LIQUIDS TO PREVENT DEHYDRATION WITH BOWEL PREP INSTRUCTIONS FROM DR Maisie Fus.     Take these medicines the morning of surgery with A SIP OF WATER: ALBUTEROL INHALER IF NEEDED AND BRING INHALER WITH YOU, SYMBICORT, FLONASE NASAL SPRAY IF NEEDE LORATADINE (CLARITIN) IF NEEDED, omeprazole (prilosec)                               You may not have any metal on your body including hair pins and              piercings  Do not wear jewelry, make-up, lotions, powders or perfumes, deodorant             Do not wear nail polish.  Do not shave  48 hours prior to surgery.              Men may shave face and neck.   Do not bring valuables to the hospital. [place] IS NOT             RESPONSIBLE   FOR VALUABLES.  Contacts, dentures or bridgework may not be worn into surgery.  Leave suitcase in the car. After surgery it may be brought to your room.                 Please read over the following fact sheets you were given: _____________________________________________________________________                CLEAR LIQUID DIET   Foods Allowed                                                                     Foods Excluded  Coffee and tea, regular and decaf                             liquids that you cannot  Plain Jell-O in any flavor                                             see through such as: Fruit ices (not with fruit pulp)                                     milk, soups, orange juice  Iced  Popsicles  All solid food Carbonated beverages, regular and diet                                    Cranberry, grape and apple juices Sports drinks like Gatorade Lightly seasoned clear broth or consume(fat free) Sugar, honey syrup  Sample Menu Breakfast                                Lunch                                     Supper Cranberry juice                    Beef broth                            Chicken broth Jell-O                                     Grape juice                           Apple juice Coffee or tea                        Jell-O                                      Popsicle                                                Coffee or tea                        Coffee or tea  _____________________________________________________________________  [facility] - Preparing for Surgery Before surgery, you can play an important role.  Because skin is not sterile, your skin needs to be as free of germs as possible.  You can reduce the number of germs on your skin by washing with CHG (chlorahexidine gluconate) soap before surgery.  CHG is an antiseptic cleaner which kills germs and bonds with the skin to continue killing germs even after washing. Please DO NOT use if you have an allergy to CHG or antibacterial soaps.  If your skin becomes reddened/irritated stop using the CHG and inform your nurse when you arrive at Short Stay. Do not shave (including legs and underarms) for at least 48 hours prior to the first CHG shower.  You may shave your face/neck. Please follow these instructions carefully:  1.  Shower with CHG Soap the night before surgery and the  morning of Surgery.  2.  If you choose to wash your hair, wash your hair first as usual with your  normal  shampoo.  3.  After you shampoo, rinse your hair and body thoroughly to remove the  shampoo.  4.  Use CHG as you would any other liquid soap.  You can apply chg  directly  to the skin and wash                       Gently with a scrungie or clean washcloth.  5.  Apply the CHG Soap to your body ONLY FROM THE NECK DOWN.   Do not use on face/ open                           Wound or open sores. Avoid contact with eyes, ears mouth and genitals (private parts).                       Wash face,  Genitals (private parts) with your normal soap.             6.  Wash thoroughly, paying special attention to the area where your surgery  will be performed.  7.  Thoroughly rinse your body with warm water from the neck down.  8.  DO NOT shower/wash with your normal soap after using and rinsing off  the CHG Soap.                9.  Pat yourself dry with a clean towel.            10.  Wear clean pajamas.            11.  Place clean sheets on your bed the night of your first shower and do not  sleep with pets. Day of Surgery : Do not apply any lotions/deodorants the morning of surgery.  Please wear clean clothes to the hospital/surgery center.  FAILURE TO FOLLOW THESE INSTRUCTIONS MAY RESULT IN THE CANCELLATION OF YOUR SURGERY PATIENT SIGNATURE_________________________________  NURSE SIGNATURE__________________________________  ________________________________________________________________________ [facility] - Preparing for Surgery Before surgery, you can play an important role.  Because skin is not sterile, your skin needs to be as free of germs as possible.  You can reduce the number of germs on your skin by washing with CHG (chlorahexidine gluconate) soap before surgery.  CHG is an antiseptic cleaner which kills germs and bonds with the skin to continue killing germs even after washing. Please DO NOT use if you have an allergy to CHG or antibacterial soaps.  If your skin becomes reddened/irritated stop using the CHG and inform your nurse when you arrive at Short Stay. Do not shave (including legs and underarms) for at least 48 hours prior to the first CHG shower.   You may shave your face/neck. Please follow these instructions carefully:  1.  Shower with CHG Soap the night before surgery and the  morning of Surgery.  2.  If you choose to wash your hair, wash your hair first as usual with your  normal  shampoo.  3.  After you shampoo, rinse your hair and body thoroughly to remove the  shampoo.                           4.  Use CHG as you would any other liquid soap.  You can apply chg directly  to the skin and wash                       Gently with a scrungie or clean washcloth.  5.  Apply the  CHG Soap to your body ONLY FROM THE NECK DOWN.   Do not use on face/ open                           Wound or open sores. Avoid contact with eyes, ears mouth and genitals (private parts).                       Wash face,  Genitals (private parts) with your normal soap.             6.  Wash thoroughly, paying special attention to the area where your surgery  will be performed.  7.  Thoroughly rinse your body with warm water from the neck down.  8.  DO NOT shower/wash with your normal soap after using and rinsing off  the CHG Soap.                9.  Pat yourself dry with a clean towel.            10.  Wear clean pajamas.            11.  Place clean sheets on your bed the night of your first shower and do not  sleep with pets. Day of Surgery : Do not apply any lotions/deodorants the morning of surgery.  Please wear clean clothes to the hospital/surgery center.  FAILURE TO FOLLOW THESE INSTRUCTIONS MAY RESULT IN THE CANCELLATION OF YOUR SURGERY PATIENT SIGNATURE_________________________________  NURSE SIGNATURE__________________________________  ________________________________________________________________________

## 2016-06-18 NOTE — Progress Notes (Signed)
[facility] XRAY 08-19-15 [facility] [place] ON CHART

## 2016-06-21 ENCOUNTER — Other Ambulatory Visit (HOSPITAL_COMMUNITY): Payer: Non-veteran care

## 2016-06-22 ENCOUNTER — Encounter (HOSPITAL_COMMUNITY)
Admission: RE | Admit: 2016-06-22 | Discharge: 2016-06-22 | Disposition: A | Discharge status: Home / Self Care | Payer: Medicare Other | Source: Ambulatory Visit | Attending: General Surgery | Admitting: General Surgery

## 2016-06-22 ENCOUNTER — Other Ambulatory Visit: Payer: Self-pay | Admitting: General Surgery

## 2016-06-22 ENCOUNTER — Encounter (HOSPITAL_COMMUNITY): Payer: Self-pay

## 2016-06-22 HISTORY — DX: Pneumonia, unspecified organism: J18.9

## 2016-06-22 HISTORY — DX: Diverticulitis of intestine, part unspecified, without perforation or abscess without bleeding: K57.92

## 2016-06-22 HISTORY — DX: Chronic obstructive pulmonary disease, unspecified: J44.9

## 2016-06-22 HISTORY — DX: Unspecified asthma, uncomplicated: J45.909

## 2016-06-22 HISTORY — DX: Major depressive disorder, single episode, unspecified: F32.9

## 2016-06-22 HISTORY — DX: Cardiac murmur, unspecified: R01.1

## 2016-06-22 HISTORY — DX: Depression, unspecified: F32.A

## 2016-06-22 LAB — BASIC METABOLIC PANEL
Anion gap: 7 (ref 5–15)
BUN: 14 mg/dL (ref 6–20)
CALCIUM: 9.3 mg/dL (ref 8.9–10.3)
CHLORIDE: 105 mmol/L (ref 101–111)
CO2: 28 mmol/L (ref 22–32)
CREATININE: 1.01 mg/dL (ref 0.61–1.24)
GFR calc non Af Amer: >60 mL/min (ref >60)
Glucose, Bld: 99 mg/dL (ref 65–99)
Potassium: 3.9 mmol/L (ref 3.5–5.1)
SODIUM: 140 mmol/L (ref 135–145)

## 2016-06-22 LAB — CBC
HCT: 40.7 % (ref 39.0–52.0)
HEMOGLOBIN: 13.4 g/dL (ref 13.0–17.0)
MCH: 28.5 pg (ref 26.0–34.0)
MCHC: 32.9 g/dL (ref 30.0–36.0)
MCV: 86.6 fL (ref 78.0–100.0)
PLATELETS: 349 10*3/uL (ref 150–400)
RBC: 4.7 MIL/uL (ref 4.22–5.81)
RDW: 13.7 % (ref 11.5–15.5)
WBC: 8.1 10*3/uL (ref 4.0–10.5)

## 2016-06-22 LAB — ABO/RH: ABO/RH(D): B POS

## 2016-06-22 NOTE — Progress Notes (Signed)
Spoke with wendy at dr Romie Levee office and let her know epic orders are gone from yesterday and need to be put in epic again.

## 2016-06-22 NOTE — H&P (Signed)
History of Present Illness (Karalina Tift MD; 06/14/2016 4:00 PM) The patient is a 61 year old male who presents with diverticulitis. 61-year-old male with a recent hospitalization for diverticulitis with microperforation. His symptoms have resolved for the most part. He still has some pain with urination. He is having regular bowel movements and tolerating a diet without much difficulty. He denies any fevers. He denies any nausea. [facility]   Problem List/Past Medical (Milaya Hora, MD; 06/14/2016 4:00 PM) DIVERTICULITIS OF INTESTINE WITH PERFORATION WITHOUT BLEEDING, UNSPECIFIED PART OF INTESTINAL TRACT (K57.80)  Past Surgical History (Yuliya Nova, MD; 06/14/2016 4:00 PM) Shoulder Surgery Left. Knee Surgery Bilateral. Colon Polyp Removal - Colonoscopy  Diagnostic Studies History (Carrah Eppolito, MD; 06/14/2016 4:00 PM) Colonoscopy 1-5 years ago  Allergies (Sade Bradford, CMA; 06/14/2016 3:47 PM) No Known Drug Allergies 03/23/2016  Medication History (Sade Bradford, CMA; 06/14/2016 3:47 PM) Neomycin Sulfate (500MG Tablet, 2 (two) Oral SEE NOTE, Taken starting 03/23/2016) Active. (TAKE TWO TABLETS AT 2 PM, 3 PM, AND 10 PM THE DAY PRIOR TO SURGERY) Flagyl (500MG Tablet, 2 (two) Oral SEE NOTE, Taken starting 03/23/2016) Active. (Take at 2pm, 3pm, and 10pm the day prior to your colon operation) Bactrim DS (800-160MG Tablet, 1 (one) Tablet Oral two times daily, Taken starting 05/19/2016) Active. OxyCODONE HCl (5MG Tablet, Oral) Active. Amoxicillin-Pot Clavulanate (875-125MG Tablet, Oral) Active. Tylenol (325MG Tablet, Oral) Active. Proventil HFA (108 (90 Base)MCG/ACT Aerosol Soln, Inhalation) Active. Symbicort (80-4.5MCG/ACT Aerosol, Inhalation) Active. HydroCHLOROthiazide (25MG Tablet, Oral) Active. Claritin (10MG Tablet, Oral) Active. Singulair (10MG Tablet, Oral) Active. Multivitamin Adult (Oral) Active. Omeprazole  (20MG Capsule DR, Oral) Active. Turmeric (450MG Capsule, Oral) Active. Medications Reconciled  Social History (Thoms Barthelemy, MD; 06/14/2016 4:00 PM) Tobacco use Never smoker. Alcohol use Moderate alcohol use. Caffeine use Coffee, Tea. No drug use  Family History (Ariyana Faw, MD; 06/14/2016 4:00 PM) Alcohol Abuse Mother. Arthritis Mother. Respiratory Condition Mother. Cancer Mother, Sister. Hypertension Father.  Other Problems (Rathana Viveros, MD; 06/14/2016 4:00 PM) Gastroesophageal Reflux Disease Heart murmur Hemorrhoids Asthma Back Pain Chronic Obstructive Lung Disease Anxiety Disorder Arthritis DYSURIA (R30.0)     Review of Systems (Allaina Brotzman MD; 06/14/2016 4:01 PM) General Not Present- Appetite Loss, Chills, Fatigue, Fever, Night Sweats, Weight Gain and Weight Loss. Skin Not Present- Change in Wart/Mole, Dryness, Hives, Jaundice, New Lesions, Non-Healing Wounds, Rash and Ulcer. HEENT Present- Wears glasses/contact lenses. Not Present- Earache, Hearing Loss, Hoarseness, Nose Bleed, Oral Ulcers, Ringing in the Ears, Seasonal Allergies, Sinus Pain, Sore Throat, Visual Disturbances and Yellow Eyes. Respiratory Not Present- Bloody sputum, Chronic Cough, Difficulty Breathing, Snoring and Wheezing. Breast Not Present- Breast Mass, Breast Pain, Nipple Discharge and Skin Changes. Cardiovascular Not Present- Chest Pain, Difficulty Breathing Lying Down, Leg Cramps, Palpitations, Rapid Heart Rate, Shortness of Breath and Swelling of Extremities. Gastrointestinal Present- Abdominal Pain and Hemorrhoids. Not Present- Bloating, Bloody Stool, Change in Bowel Habits, Chronic diarrhea, Constipation, Difficulty Swallowing, Excessive gas, Gets full quickly at meals, Indigestion, Nausea, Rectal Pain and Vomiting. Male Genitourinary Present- Nocturia and Painful Urination. Not Present- Blood in Urine, Change in Urinary Stream, Frequency, Impotence, Urgency and Urine  Leakage. Neurological Not Present- Decreased Memory, Fainting, Headaches, Numbness, Seizures, Tingling, Tremor, Trouble walking and Weakness. Psychiatric Present- Anxiety. Not Present- Bipolar, Change in Sleep Pattern, Depression, Fearful and Frequent crying. Endocrine Not Present- Cold Intolerance, Excessive Hunger, Hair Changes, Heat Intolerance and New Diabetes. Hematology Not Present- Blood Thinners, Easy Bruising, Excessive bleeding, Gland problems, HIV   and Persistent Infections.  Vitals (Sade Bradford CMA; 06/14/2016 3:48 PM) 06/14/2016 3:47 PM Weight: 211 lb Height: 70in Body Surface Area: 2.14 m Body Mass Index: 30.28 kg/m  Temp.: 98.4F  Pulse: 65 (Regular)  BP: 142/72 (Sitting, Left Arm, Standard)      Physical Exam (Bradleigh Sonnen MD; 06/14/2016 4:01 PM)  General Mental Status-Alert. General Appearance-Not in acute distress. Build & Nutrition-Well nourished. Posture-Normal posture. Gait-Normal.  Head and Neck Head-normocephalic, atraumatic with no lesions or palpable masses. Trachea-midline.  Chest and Lung Exam Chest and lung exam reveals -on auscultation, normal breath sounds, no adventitious sounds and normal vocal resonance.  Cardiovascular Cardiovascular examination reveals -normal heart sounds, regular rate and rhythm with no murmurs and no digital clubbing, cyanosis, edema, increased warmth or tenderness.  Abdomen Inspection Inspection of the abdomen reveals - No Hernias. Palpation/Percussion Palpation and Percussion of the abdomen reveal - Soft, Non Tender, No Rigidity (guarding), No hepatosplenomegaly and No Palpable abdominal masses.  Neurologic Neurologic evaluation reveals -alert and oriented x 3 with no impairment of recent or remote memory, normal attention span and ability to concentrate, normal sensation and normal coordination.  Musculoskeletal Normal Exam - Bilateral-Upper Extremity Strength Normal and Lower  Extremity Strength Normal.    Assessment & Plan (Tariya Morrissette MD; 06/14/2016 4:00 PM)  DIVERTICULITIS OF INTESTINE WITH PERFORATION WITHOUT BLEEDING, UNSPECIFIED PART OF INTESTINAL TRACT (K57.80) Impression: DIVERTICULITIS OF INTESTINE WITH PERFORATION WITHOUT BLEEDING, UNSPECIFIED PART OF INTESTINAL TRACT (K57.80) Impression: 60-year-old male who presents to the office for hospital follow-up after he was noted to have diverticulitis with microperforation. He was treated with antibiotics IV and by mouth in Sept 17. His symptoms have mostly resolved. He is having some dysuria. He is ready for surgery in mid January to remove the diseased portion of his colon. All questions [facility] and [facility] a midline adhesion to the posterior dome of the bladder. The surgery and anatomy were described to the patient as well as the risks of surgery and the possible complications. These include: Bleeding, deep abdominal infections and possible wound complications such as hernia and infection, damage to adjacent structures, leak of surgical connections, which can lead to other surgeries and possibly an ostomy, possible need for other procedures, such as abscess drains in radiology, possible prolonged hospital stay, possible diarrhea from removal of part of the colon, possible constipation from narcotics, possible bowel, bladder or sexual dysfunction if having rectal surgery, prolonged fatigue/weakness or appetite loss, possible early recurrence of of disease, possible complications of their medical problems such as heart disease or arrhythmias or lung problems, death (less than 1%). I believe the patient understands and wishes to proceed with the surgery. 

## 2016-06-23 ENCOUNTER — Encounter (HOSPITAL_COMMUNITY)
Admission: RE | Disposition: A | Discharge status: Home / Self Care | Payer: Self-pay | Source: Ambulatory Visit | Attending: General Surgery

## 2016-06-23 ENCOUNTER — Inpatient Hospital Stay (HOSPITAL_COMMUNITY): Payer: Medicare Other | Admitting: Anesthesiology

## 2016-06-23 ENCOUNTER — Encounter (HOSPITAL_COMMUNITY): Payer: Self-pay | Admitting: Anesthesiology

## 2016-06-23 ENCOUNTER — Inpatient Hospital Stay (HOSPITAL_COMMUNITY)
Admission: RE | Admit: 2016-06-23 | Discharge: 2016-06-26 | DRG: 330 | Disposition: A | Discharge status: Home / Self Care | Payer: Medicare Other | Source: Ambulatory Visit | Attending: General Surgery | Admitting: General Surgery

## 2016-06-23 DIAGNOSIS — F329 Major depressive disorder, single episode, unspecified: Secondary | ICD-10-CM | POA: Diagnosis present

## 2016-06-23 DIAGNOSIS — J449 Chronic obstructive pulmonary disease, unspecified: Secondary | ICD-10-CM | POA: Diagnosis present

## 2016-06-23 DIAGNOSIS — I10 Essential (primary) hypertension: Secondary | ICD-10-CM | POA: Diagnosis present

## 2016-06-23 DIAGNOSIS — K5780 Diverticulitis of intestine, part unspecified, with perforation and abscess without bleeding: Principal | ICD-10-CM | POA: Diagnosis present

## 2016-06-23 DIAGNOSIS — Z79899 Other long term (current) drug therapy: Secondary | ICD-10-CM

## 2016-06-23 DIAGNOSIS — K219 Gastro-esophageal reflux disease without esophagitis: Secondary | ICD-10-CM | POA: Diagnosis present

## 2016-06-23 DIAGNOSIS — K5790 Diverticulosis of intestine, part unspecified, without perforation or abscess without bleeding: Secondary | ICD-10-CM | POA: Diagnosis present

## 2016-06-23 DIAGNOSIS — Z79891 Long term (current) use of opiate analgesic: Secondary | ICD-10-CM | POA: Diagnosis not present

## 2016-06-23 HISTORY — DX: Dyspnea, unspecified: R06.00

## 2016-06-23 HISTORY — DX: Other seasonal allergic rhinitis: J30.2

## 2016-06-23 LAB — TYPE AND SCREEN
ABO/RH(D): B POS
ANTIBODY SCREEN: NEGATIVE

## 2016-06-23 LAB — HEMOGLOBIN A1C
HEMOGLOBIN A1C: 5.5 % (ref 4.8–5.6)
MEAN PLASMA GLUCOSE: 111 mg/dL

## 2016-06-23 SURGERY — COLECTOMY, PARTIAL, ROBOT-ASSISTED, LAPAROSCOPIC
Anesthesia: General | Site: Abdomen

## 2016-06-23 MED ORDER — PROPOFOL 10 MG/ML IV BOLUS
INTRAVENOUS | Status: AC
  Filled 2016-06-23: qty 20

## 2016-06-23 MED ORDER — BUPIVACAINE LIPOSOME 1.3 % IJ SUSP
INTRAMUSCULAR | Status: DC | PRN
  Administered 2016-06-23: 20 mL

## 2016-06-23 MED ORDER — DEXAMETHASONE SODIUM PHOSPHATE 10 MG/ML IJ SOLN
INTRAMUSCULAR | Status: DC | PRN
  Administered 2016-06-23: 10 mg via INTRAVENOUS

## 2016-06-23 MED ORDER — MEPERIDINE HCL 50 MG/ML IJ SOLN: 6.2500 mg | INTRAMUSCULAR | Status: DC | PRN

## 2016-06-23 MED ORDER — LABETALOL HCL 5 MG/ML IV SOLN
INTRAVENOUS | Status: DC | PRN
  Administered 2016-06-23 (×2): 5 mg via INTRAVENOUS

## 2016-06-23 MED ORDER — ONDANSETRON HCL 4 MG/2ML IJ SOLN
INTRAMUSCULAR | Status: DC | PRN
  Administered 2016-06-23: 4 mg via INTRAVENOUS

## 2016-06-23 MED ORDER — FLUTICASONE PROPIONATE 50 MCG/ACT NA SUSP: 1.0000 | Freq: Every day | NASAL | Status: DC | PRN

## 2016-06-23 MED ORDER — MOMETASONE FURO-FORMOTEROL FUM 100-5 MCG/ACT IN AERO
2.0000 | INHALATION_SPRAY | Freq: Two times a day (BID) | RESPIRATORY_TRACT | Status: DC
  Administered 2016-06-23 – 2016-06-26 (×6): 2 via RESPIRATORY_TRACT
  Filled 2016-06-23: qty 8.8

## 2016-06-23 MED ORDER — ALBUTEROL SULFATE HFA 108 (90 BASE) MCG/ACT IN AERS: 2.0000 | INHALATION_SPRAY | Freq: Four times a day (QID) | RESPIRATORY_TRACT | Status: DC | PRN

## 2016-06-23 MED ORDER — ACETAMINOPHEN 500 MG PO TABS
1000.0000 mg | ORAL_TABLET | ORAL | Status: AC
  Administered 2016-06-23: 1000 mg via ORAL
  Filled 2016-06-23: qty 2

## 2016-06-23 MED ORDER — DEXTROSE 5 % IV SOLN
2.0000 g | INTRAVENOUS | Status: AC
  Administered 2016-06-23: 2 g via INTRAVENOUS

## 2016-06-23 MED ORDER — DIPHENHYDRAMINE HCL 50 MG/ML IJ SOLN: 25.0000 mg | Freq: Four times a day (QID) | INTRAMUSCULAR | Status: DC | PRN

## 2016-06-23 MED ORDER — LABETALOL HCL 5 MG/ML IV SOLN
INTRAVENOUS | Status: AC
  Filled 2016-06-23: qty 4

## 2016-06-23 MED ORDER — ONDANSETRON HCL 4 MG/2ML IJ SOLN: 4.0000 mg | Freq: Once | INTRAMUSCULAR | Status: DC | PRN

## 2016-06-23 MED ORDER — BUPIVACAINE HCL (PF) 0.25 % IJ SOLN
INTRAMUSCULAR | Status: DC | PRN
  Administered 2016-06-23: 30 mL

## 2016-06-23 MED ORDER — HEPARIN SODIUM (PORCINE) 5000 UNIT/ML IJ SOLN
5000.0000 [IU] | Freq: Once | INTRAMUSCULAR | Status: AC
  Administered 2016-06-23: 5000 [IU] via SUBCUTANEOUS
  Filled 2016-06-23: qty 1

## 2016-06-23 MED ORDER — OXYCODONE HCL 5 MG PO TABS
5.0000 mg | ORAL_TABLET | ORAL | Status: DC | PRN
  Administered 2016-06-23 – 2016-06-26 (×10): 5 mg via ORAL
  Filled 2016-06-23 (×10): qty 1

## 2016-06-23 MED ORDER — PROPOFOL 10 MG/ML IV BOLUS
INTRAVENOUS | Status: DC | PRN
  Administered 2016-06-23: 110 mg via INTRAVENOUS

## 2016-06-23 MED ORDER — CELECOXIB 200 MG PO CAPS
400.0000 mg | ORAL_CAPSULE | ORAL | Status: AC
  Administered 2016-06-23: 400 mg via ORAL
  Filled 2016-06-23: qty 2

## 2016-06-23 MED ORDER — HYDROCHLOROTHIAZIDE 25 MG PO TABS
25.0000 mg | ORAL_TABLET | Freq: Every day | ORAL | Status: DC
  Administered 2016-06-23 – 2016-06-26 (×4): 25 mg via ORAL
  Filled 2016-06-23 (×4): qty 1

## 2016-06-23 MED ORDER — SUCCINYLCHOLINE CHLORIDE 200 MG/10ML IV SOSY
PREFILLED_SYRINGE | INTRAVENOUS | Status: AC
  Filled 2016-06-23: qty 10

## 2016-06-23 MED ORDER — DIPHENHYDRAMINE HCL 25 MG PO CAPS: 25.0000 mg | ORAL_CAPSULE | Freq: Four times a day (QID) | ORAL | Status: DC | PRN

## 2016-06-23 MED ORDER — GABAPENTIN 300 MG PO CAPS
300.0000 mg | ORAL_CAPSULE | ORAL | Status: AC
  Administered 2016-06-23: 300 mg via ORAL
  Filled 2016-06-23: qty 1

## 2016-06-23 MED ORDER — HYDROMORPHONE HCL 1 MG/ML IJ SOLN
0.2500 mg | INTRAMUSCULAR | Status: DC | PRN
  Administered 2016-06-23: 0.5 mg via INTRAVENOUS

## 2016-06-23 MED ORDER — MIDAZOLAM HCL 2 MG/2ML IJ SOLN
INTRAMUSCULAR | Status: DC | PRN
  Administered 2016-06-23: 2 mg via INTRAVENOUS

## 2016-06-23 MED ORDER — SUGAMMADEX SODIUM 200 MG/2ML IV SOLN
INTRAVENOUS | Status: AC
Start: 2016-06-23 — End: 2016-06-23
  Filled 2016-06-23: qty 2

## 2016-06-23 MED ORDER — SUFENTANIL CITRATE 50 MCG/ML IV SOLN
INTRAVENOUS | Status: AC
  Filled 2016-06-23: qty 1

## 2016-06-23 MED ORDER — ROCURONIUM BROMIDE 50 MG/5ML IV SOSY
PREFILLED_SYRINGE | INTRAVENOUS | Status: AC
  Filled 2016-06-23: qty 5

## 2016-06-23 MED ORDER — LACTATED RINGERS IV SOLN: INTRAVENOUS | Status: DC | PRN

## 2016-06-23 MED ORDER — ENOXAPARIN SODIUM 40 MG/0.4ML ~~LOC~~ SOLN
40.0000 mg | SUBCUTANEOUS | Status: DC
  Administered 2016-06-24 – 2016-06-26 (×3): 40 mg via SUBCUTANEOUS
  Filled 2016-06-23 (×3): qty 0.4

## 2016-06-23 MED ORDER — KCL IN DEXTROSE-NACL 20-5-0.45 MEQ/L-%-% IV SOLN
INTRAVENOUS | Status: DC
  Administered 2016-06-23 – 2016-06-24 (×2): via INTRAVENOUS
  Filled 2016-06-23 (×2): qty 1000

## 2016-06-23 MED ORDER — CEFOTETAN DISODIUM 2 G IJ SOLR
2.0000 g | Freq: Two times a day (BID) | INTRAMUSCULAR | Status: AC
  Administered 2016-06-23: 2 g via INTRAVENOUS
  Filled 2016-06-23: qty 2

## 2016-06-23 MED ORDER — SODIUM CHLORIDE 0.9 % IJ SOLN
INTRAMUSCULAR | Status: AC
Start: 2016-06-23 — End: 2016-06-23
  Filled 2016-06-23: qty 20

## 2016-06-23 MED ORDER — HYDROMORPHONE HCL 1 MG/ML IJ SOLN
INTRAMUSCULAR | Status: AC
  Administered 2016-06-23: 0.5 mg via INTRAVENOUS
  Filled 2016-06-23: qty 1

## 2016-06-23 MED ORDER — LIDOCAINE 2% (20 MG/ML) 5 ML SYRINGE
INTRAMUSCULAR | Status: DC | PRN
Start: 2016-06-23 — End: 2016-06-23
  Administered 2016-06-23: 100 mg via INTRAVENOUS

## 2016-06-23 MED ORDER — ONDANSETRON HCL 4 MG/2ML IJ SOLN
INTRAMUSCULAR | Status: AC
  Filled 2016-06-23: qty 2

## 2016-06-23 MED ORDER — SUGAMMADEX SODIUM 200 MG/2ML IV SOLN
INTRAVENOUS | Status: DC | PRN
  Administered 2016-06-23: 200 mg via INTRAVENOUS

## 2016-06-23 MED ORDER — MAGNESIUM GLUCONATE 500 MG PO TABS
500.0000 mg | ORAL_TABLET | Freq: Every day | ORAL | Status: DC
  Administered 2016-06-24 – 2016-06-25 (×2): 500 mg via ORAL
  Filled 2016-06-23 (×2): qty 1

## 2016-06-23 MED ORDER — SODIUM CHLORIDE 0.9 % IJ SOLN
INTRAMUSCULAR | Status: AC
  Filled 2016-06-23: qty 10

## 2016-06-23 MED ORDER — BUPIVACAINE HCL (PF) 0.25 % IJ SOLN
INTRAMUSCULAR | Status: AC
  Filled 2016-06-23: qty 30

## 2016-06-23 MED ORDER — LIDOCAINE 2% (20 MG/ML) 5 ML SYRINGE
INTRAMUSCULAR | Status: AC
  Filled 2016-06-23: qty 5

## 2016-06-23 MED ORDER — HYDROXYCHLOROQUINE SULFATE 200 MG PO TABS
200.0000 mg | ORAL_TABLET | Freq: Two times a day (BID) | ORAL | Status: DC
  Administered 2016-06-24 – 2016-06-26 (×6): 200 mg via ORAL
  Filled 2016-06-23 (×6): qty 1

## 2016-06-23 MED ORDER — MIDAZOLAM HCL 2 MG/2ML IJ SOLN
INTRAMUSCULAR | Status: AC
  Filled 2016-06-23: qty 2

## 2016-06-23 MED ORDER — MORPHINE SULFATE (PF) 2 MG/ML IV SOLN
2.0000 mg | INTRAVENOUS | Status: DC | PRN
  Administered 2016-06-23 – 2016-06-24 (×3): 2 mg via INTRAVENOUS
  Filled 2016-06-23 (×3): qty 1

## 2016-06-23 MED ORDER — LACTATED RINGERS IR SOLN
Status: DC | PRN
  Administered 2016-06-23: 1000 mL

## 2016-06-23 MED ORDER — ALVIMOPAN 12 MG PO CAPS
12.0000 mg | ORAL_CAPSULE | Freq: Two times a day (BID) | ORAL | Status: DC
  Administered 2016-06-24 (×2): 12 mg via ORAL
  Filled 2016-06-23 (×2): qty 1

## 2016-06-23 MED ORDER — 0.9 % SODIUM CHLORIDE (POUR BTL) OPTIME
TOPICAL | Status: DC | PRN
  Administered 2016-06-23: 1000 mL

## 2016-06-23 MED ORDER — ALVIMOPAN 12 MG PO CAPS
12.0000 mg | ORAL_CAPSULE | Freq: Once | ORAL | Status: AC
  Administered 2016-06-23: 12 mg via ORAL
  Filled 2016-06-23: qty 1

## 2016-06-23 MED ORDER — LACTATED RINGERS IV SOLN
INTRAVENOUS | Status: DC | PRN
  Administered 2016-06-23 (×2): via INTRAVENOUS

## 2016-06-23 MED ORDER — CEFOTETAN DISODIUM-DEXTROSE 2-2.08 GM-% IV SOLR
INTRAVENOUS | Status: AC
  Filled 2016-06-23: qty 50

## 2016-06-23 MED ORDER — LORATADINE 10 MG PO TABS: 10.0000 mg | ORAL_TABLET | Freq: Every day | ORAL | Status: DC | PRN

## 2016-06-23 MED ORDER — ALBUTEROL SULFATE (2.5 MG/3ML) 0.083% IN NEBU
2.5000 mg | INHALATION_SOLUTION | Freq: Four times a day (QID) | RESPIRATORY_TRACT | Status: DC | PRN
Start: 2016-06-23 — End: 2016-06-26
  Administered 2016-06-23: 2.5 mg via RESPIRATORY_TRACT
  Filled 2016-06-23: qty 3

## 2016-06-23 MED ORDER — BUPIVACAINE LIPOSOME 1.3 % IJ SUSP
20.0000 mL | INTRAMUSCULAR | Status: DC
  Filled 2016-06-23: qty 20

## 2016-06-23 MED ORDER — DEXAMETHASONE SODIUM PHOSPHATE 10 MG/ML IJ SOLN
INTRAMUSCULAR | Status: AC
  Filled 2016-06-23: qty 1

## 2016-06-23 MED ORDER — SUFENTANIL CITRATE 50 MCG/ML IV SOLN
INTRAVENOUS | Status: DC | PRN
  Administered 2016-06-23: 10 ug via INTRAVENOUS
  Administered 2016-06-23: 30 ug via INTRAVENOUS
  Administered 2016-06-23 (×2): 10 ug via INTRAVENOUS

## 2016-06-23 MED ORDER — ROCURONIUM BROMIDE 10 MG/ML (PF) SYRINGE
PREFILLED_SYRINGE | INTRAVENOUS | Status: DC | PRN
Start: 2016-06-23 — End: 2016-06-23
  Administered 2016-06-23: 20 mg via INTRAVENOUS
  Administered 2016-06-23: 50 mg via INTRAVENOUS
  Administered 2016-06-23: 5 mg via INTRAVENOUS
  Administered 2016-06-23: 20 mg via INTRAVENOUS

## 2016-06-23 MED ORDER — PANTOPRAZOLE SODIUM 40 MG PO TBEC
40.0000 mg | DELAYED_RELEASE_TABLET | Freq: Every day | ORAL | Status: DC
  Administered 2016-06-24 – 2016-06-26 (×3): 40 mg via ORAL
  Filled 2016-06-23 (×3): qty 1

## 2016-06-23 MED ORDER — MONTELUKAST SODIUM 10 MG PO TABS: 10.0000 mg | ORAL_TABLET | Freq: Every evening | ORAL | Status: DC | PRN

## 2016-06-23 MED ORDER — LACTATED RINGERS IV SOLN: Freq: Once | INTRAVENOUS | Status: DC

## 2016-06-23 MED ORDER — ACETAMINOPHEN 500 MG PO TABS
1000.0000 mg | ORAL_TABLET | Freq: Four times a day (QID) | ORAL | Status: AC
  Administered 2016-06-23 – 2016-06-25 (×7): 1000 mg via ORAL
  Filled 2016-06-23 (×7): qty 2

## 2016-06-23 SURGICAL SUPPLY — 98 items
ADH SKN CLS APL DERMABOND .7 (GAUZE/BANDAGES/DRESSINGS) ×1
BLADE EXTENDED COATED 6.5IN (ELECTRODE) IMPLANT
CANNULA REDUC XI 12-8 STAPL (CANNULA) ×1
CANNULA REDUC XI 12-8MM STAPL (CANNULA) ×1
CANNULA REDUCER 12-8 DVNC XI (CANNULA) ×1 IMPLANT
CELLS DAT CNTRL 66122 CELL SVR (MISCELLANEOUS) IMPLANT
CLIP LIGATING HEM O LOK PURPLE (MISCELLANEOUS) IMPLANT
CLIP LIGATING HEMOLOK MED (MISCELLANEOUS) IMPLANT
COUNTER NEEDLE 20 DBL MAG RED (NEEDLE) ×3 IMPLANT
COVER MAYO STAND STRL (DRAPES) ×6 IMPLANT
COVER TIP SHEARS 8 DVNC (MISCELLANEOUS) ×1 IMPLANT
COVER TIP SHEARS 8MM DA VINCI (MISCELLANEOUS) ×2
DECANTER SPIKE VIAL GLASS SM (MISCELLANEOUS) ×3 IMPLANT
DERMABOND ADVANCED (GAUZE/BANDAGES/DRESSINGS) ×2
DERMABOND ADVANCED .7 DNX12 (GAUZE/BANDAGES/DRESSINGS) IMPLANT
DEVICE TROCAR PUNCTURE CLOSURE (ENDOMECHANICALS) ×2 IMPLANT
DRAIN CHANNEL 19F RND (DRAIN) IMPLANT
DRAPE ARM DVNC X/XI (DISPOSABLE) ×4 IMPLANT
DRAPE COLUMN DVNC XI (DISPOSABLE) ×1 IMPLANT
DRAPE DA VINCI XI ARM (DISPOSABLE) ×8
DRAPE DA VINCI XI COLUMN (DISPOSABLE) ×2
DRAPE SURG IRRIG POUCH 19X23 (DRAPES) ×3 IMPLANT
DRSG OPSITE POSTOP 4X10 (GAUZE/BANDAGES/DRESSINGS) IMPLANT
DRSG OPSITE POSTOP 4X6 (GAUZE/BANDAGES/DRESSINGS) ×2 IMPLANT
DRSG OPSITE POSTOP 4X8 (GAUZE/BANDAGES/DRESSINGS) IMPLANT
ELECT PENCIL ROCKER SW 15FT (MISCELLANEOUS) ×6 IMPLANT
ELECT REM PT RETURN 15FT ADLT (MISCELLANEOUS) ×3 IMPLANT
ENDOLOOP SUT PDS II  0 18 (SUTURE)
ENDOLOOP SUT PDS II 0 18 (SUTURE) IMPLANT
EVACUATOR SILICONE 100CC (DRAIN) IMPLANT
GAUZE SPONGE 4X4 12PLY STRL (GAUZE/BANDAGES/DRESSINGS) IMPLANT
GLOVE BIO SURGEON STRL SZ 6.5 (GLOVE) ×6 IMPLANT
GLOVE BIO SURGEONS STRL SZ 6.5 (GLOVE) ×3
GLOVE BIOGEL PI IND STRL 7.0 (GLOVE) ×3 IMPLANT
GLOVE BIOGEL PI INDICATOR 7.0 (GLOVE) ×6
GOWN STRL REUS W/TWL 2XL LVL3 (GOWN DISPOSABLE) ×9 IMPLANT
GOWN STRL REUS W/TWL XL LVL3 (GOWN DISPOSABLE) ×12 IMPLANT
GRASPER ENDOPATH ANVIL 10MM (MISCELLANEOUS) IMPLANT
HOLDER FOLEY CATH W/STRAP (MISCELLANEOUS) ×3 IMPLANT
IRRIG SUCT STRYKERFLOW 2 WTIP (MISCELLANEOUS) ×3
IRRIGATION SUCT STRKRFLW 2 WTP (MISCELLANEOUS) ×1 IMPLANT
KIT PROCEDURE DA VINCI SI (MISCELLANEOUS) ×2
KIT PROCEDURE DVNC SI (MISCELLANEOUS) ×1 IMPLANT
LEGGING LITHOTOMY PAIR STRL (DRAPES) ×3 IMPLANT
NDL INSUFFLATION 14GA 120MM (NEEDLE) ×1 IMPLANT
NEEDLE INSUFFLATION 14GA 120MM (NEEDLE) ×3 IMPLANT
PACK CARDIOVASCULAR III (CUSTOM PROCEDURE TRAY) ×3 IMPLANT
PACK COLON (CUSTOM PROCEDURE TRAY) ×3 IMPLANT
PORT LAP GEL ALEXIS MED 5-9CM (MISCELLANEOUS) IMPLANT
RELOAD STAPLE 45 BLU REG DVNC (STAPLE) IMPLANT
RELOAD STAPLE 45 GRN THCK DVNC (STAPLE) IMPLANT
RETRACTOR WND ALEXIS 18 MED (MISCELLANEOUS) IMPLANT
RTRCTR WOUND ALEXIS 18CM MED (MISCELLANEOUS)
SCISSORS LAP 5X35 DISP (ENDOMECHANICALS) ×3 IMPLANT
SEAL CANN UNIV 5-8 DVNC XI (MISCELLANEOUS) ×3 IMPLANT
SEAL XI 5MM-8MM UNIVERSAL (MISCELLANEOUS) ×6
SEALER VESSEL DA VINCI XI (MISCELLANEOUS) ×2
SEALER VESSEL EXT DVNC XI (MISCELLANEOUS) ×1 IMPLANT
SET BI-LUMEN FLTR TB AIRSEAL (TUBING) ×3 IMPLANT
SLEEVE ADV FIXATION 5X100MM (TROCAR) IMPLANT
SOLUTION ELECTROLUBE (MISCELLANEOUS) ×3 IMPLANT
STAPLER 45 BLU RELOAD XI (STAPLE) IMPLANT
STAPLER 45 BLUE RELOAD XI (STAPLE)
STAPLER 45 GREEN RELOAD XI (STAPLE)
STAPLER 45 GRN RELOAD XI (STAPLE) IMPLANT
STAPLER CANNULA SEAL DVNC XI (STAPLE) ×1 IMPLANT
STAPLER CANNULA SEAL XI (STAPLE) ×2
STAPLER CIRC ILS CVD 33MM 37CM (STAPLE) ×2 IMPLANT
STAPLER SHEATH (SHEATH) ×2
STAPLER SHEATH ENDOWRIST DVNC (SHEATH) ×1 IMPLANT
STAPLER VISISTAT 35W (STAPLE) ×3 IMPLANT
SUT ETHILON 2 0 PS N (SUTURE) IMPLANT
SUT NOVA NAB GS-21 0 18 T12 DT (SUTURE) ×6 IMPLANT
SUT PDS AB 1 CTX 36 (SUTURE) IMPLANT
SUT PDS AB 1 TP1 96 (SUTURE) IMPLANT
SUT PROLENE 2 0 KS (SUTURE) ×3 IMPLANT
SUT SILK 2 0 (SUTURE) ×3
SUT SILK 2 0 SH CR/8 (SUTURE) ×3 IMPLANT
SUT SILK 2-0 18XBRD TIE 12 (SUTURE) ×1 IMPLANT
SUT SILK 3 0 (SUTURE) ×3
SUT SILK 3 0 SH CR/8 (SUTURE) ×3 IMPLANT
SUT SILK 3-0 18XBRD TIE 12 (SUTURE) ×1 IMPLANT
SUT V-LOC BARB 180 2/0GR6 GS22 (SUTURE)
SUT VIC AB 2-0 SH 18 (SUTURE) ×3 IMPLANT
SUT VIC AB 2-0 SH 27 (SUTURE) ×3
SUT VIC AB 2-0 SH 27X BRD (SUTURE) ×1 IMPLANT
SUT VIC AB 3-0 SH 18 (SUTURE) IMPLANT
SUT VIC AB 4-0 PS2 27 (SUTURE) ×6 IMPLANT
SUTURE V-LC BRB 180 2/0GR6GS22 (SUTURE) IMPLANT
SYR 10ML LL (SYRINGE) ×3 IMPLANT
SYS LAPSCP GELPORT 120MM (MISCELLANEOUS)
SYSTEM LAPSCP GELPORT 120MM (MISCELLANEOUS) IMPLANT
TOWEL OR 17X26 10 PK STRL BLUE (TOWEL DISPOSABLE) ×3 IMPLANT
TOWEL OR NON WOVEN STRL DISP B (DISPOSABLE) ×3 IMPLANT
TRAY FOLEY W/METER SILVER 16FR (SET/KITS/TRAYS/PACK) ×3 IMPLANT
TROCAR ADV FIXATION 5X100MM (TROCAR) ×3 IMPLANT
TUBING CONNECTING 10 (TUBING) IMPLANT
TUBING CONNECTING 10' (TUBING)

## 2016-06-23 NOTE — Interval H&P Note (Signed)
History and Physical Interval Note:  06/23/2016 8:17 AM  Austin Faulkner  has presented today for surgery, with the diagnosis of DIVERTICULITIS  The various methods of treatment have been discussed with the patient and family. After consideration of risks, benefits and other options for treatment, the patient has consented to  Procedure(s): XI ROBOT ASSISTED LAPAROSCOPIC PARTIAL COLECTOMY (N/A) as a surgical intervention .  The patient's history has been reviewed, patient examined, no change in status, stable for surgery.  I have reviewed the patient's chart and labs.  Questions were answered to the patient's satisfaction.     Vanita Panda, MD  Colorectal and General Surgery [facility]

## 2016-06-23 NOTE — Anesthesia Preprocedure Evaluation (Signed)
Anesthesia Evaluation  Patient identified by MRN, date of birth, ID band Patient awake    Reviewed: Allergy & Precautions, NPO status , Patient's Chart, lab work & pertinent test results  Airway Mallampati: I  TM Distance: >3 FB Neck ROM: Full    Dental   Pulmonary COPD,    Pulmonary exam normal        Cardiovascular hypertension, Pt. on medications Normal cardiovascular exam     Neuro/Psych Depression    GI/Hepatic GERD  Medicated and Controlled,  Endo/Other    Renal/GU      Musculoskeletal   Abdominal   Peds  Hematology   Anesthesia Other Findings   Reproductive/Obstetrics                             Anesthesia Physical Anesthesia Plan  ASA: III  Anesthesia Plan: General   Post-op Pain Management:    Induction: Intravenous  Airway Management Planned: Oral ETT  Additional Equipment:   Intra-op Plan:   Post-operative Plan: Extubation in OR  Informed Consent: I have reviewed the patients History and Physical, chart, labs and discussed the procedure including the risks, benefits and alternatives for the proposed anesthesia with the patient or authorized representative who has indicated his/her understanding and acceptance.     Plan Discussed with: CRNA and Surgeon  Anesthesia Plan Comments:         Anesthesia Quick Evaluation

## 2016-06-23 NOTE — Transfer of Care (Signed)
Immediate Anesthesia Transfer of Care Note  Patient: Austin Faulkner  Procedure(s) Performed: Procedure(s): XI ROBOT ASSISTED LAPAROSCOPIC SIGMOIDECTOMY (N/A)  Patient Location: PACU  Anesthesia Type:General  Level of Consciousness: awake and alert   Airway & Oxygen Therapy: Patient Spontanous Breathing and Patient connected to face mask oxygen  Post-op Assessment: Report given to RN and Post -op Vital signs reviewed and stable  Post vital signs: Reviewed and stable  Last Vitals:  Vitals:   06/23/16 0627  BP: 136/85  Pulse: 71  Resp: 18  Temp: 36.8 C    Last Pain:  Vitals:   06/23/16 0652  TempSrc:   PainSc: 7       Patients Stated Pain Goal: 1 (06/23/16 8338)  Complications: No apparent anesthesia complications

## 2016-06-23 NOTE — H&P (View-Only) (Signed)
History of Present Illness (Levie Wages MD; 06/14/2016 4:00 PM) The patient is a 60 year old male who presents with diverticulitis. 59-year-old male with a recent hospitalization for diverticulitis with microperforation. His symptoms have resolved for the most part. He still has some pain with urination. He is having regular bowel movements and tolerating a diet without much difficulty. He denies any fevers. He denies any nausea. [facility].   Problem List/Past Medical (Zikeria Keough, MD; 06/14/2016 4:00 PM) DIVERTICULITIS OF INTESTINE WITH PERFORATION WITHOUT BLEEDING, UNSPECIFIED PART OF INTESTINAL TRACT (K57.80)  Past Surgical History ([place] Oborn, MD; 06/14/2016 4:00 PM) Shoulder Surgery Left. Knee Surgery Bilateral. Colon Polyp Removal - Colonoscopy  Diagnostic Studies History (Haygen Zebrowski, MD; 06/14/2016 4:00 PM) Colonoscopy 1-5 years ago  Allergies (Sade Bradford, CMA; 06/14/2016 3:47 PM) No Known Drug Allergies 03/23/2016  Medication History (Sade Bradford, CMA; 06/14/2016 3:47 PM) Neomycin Sulfate (500MG Tablet, 2 (two) Oral SEE NOTE, Taken starting 03/23/2016) Active. (TAKE TWO TABLETS AT 2 PM, 3 PM, AND 10 PM THE DAY PRIOR TO SURGERY) Flagyl (500MG Tablet, 2 (two) Oral SEE NOTE, Taken starting 03/23/2016) Active. (Take at 2pm, 3pm, and 10pm the day prior to your colon operation) Bactrim DS (800-160MG Tablet, 1 (one) Tablet Oral two times daily, Taken starting 05/19/2016) Active. OxyCODONE HCl (5MG Tablet, Oral) Active. Amoxicillin-Pot Clavulanate (875-125MG Tablet, Oral) Active. Tylenol (325MG Tablet, Oral) Active. Proventil HFA (108 (90 Base)MCG/ACT Aerosol Soln, Inhalation) Active. Symbicort (80-4.5MCG/ACT Aerosol, Inhalation) Active. HydroCHLOROthiazide (25MG Tablet, Oral) Active. Claritin (10MG Tablet, Oral) Active. Singulair (10MG Tablet, Oral) Active. Multivitamin Adult (Oral) Active. Omeprazole  (20MG Capsule DR, Oral) Active. Turmeric (450MG Capsule, Oral) Active. Medications Reconciled  Social History (Asmaa Tirpak, MD; 06/14/2016 4:00 PM) Tobacco use Never smoker. Alcohol use Moderate alcohol use. Caffeine use Coffee, Tea. No drug use  Family History (Luismario Coston, MD; 06/14/2016 4:00 PM) Alcohol Abuse Mother. Arthritis Mother. Respiratory Condition Mother. Cancer Mother, Sister. Hypertension Father.  Other Problems (Charli Halle, MD; 06/14/2016 4:00 PM) Gastroesophageal Reflux Disease Heart murmur Hemorrhoids Asthma Back Pain Chronic Obstructive Lung Disease Anxiety Disorder Arthritis DYSURIA (R30.0)     Review of Systems (Edan Juday MD; 06/14/2016 4:01 PM) General Not Present- Appetite Loss, Chills, Fatigue, Fever, Night Sweats, Weight Gain and Weight Loss. Skin Not Present- Change in Wart/Mole, Dryness, Hives, Jaundice, New Lesions, Non-Healing Wounds, Rash and Ulcer. HEENT Present- Wears glasses/contact lenses. Not Present- Earache, Hearing Loss, Hoarseness, Nose Bleed, Oral Ulcers, Ringing in the Ears, Seasonal Allergies, Sinus Pain, Sore Throat, Visual Disturbances and Yellow Eyes. Respiratory Not Present- Bloody sputum, Chronic Cough, Difficulty Breathing, Snoring and Wheezing. Breast Not Present- Breast Mass, Breast Pain, Nipple Discharge and Skin Changes. Cardiovascular Not Present- Chest Pain, Difficulty Breathing Lying Down, Leg Cramps, Palpitations, Rapid Heart Rate, Shortness of Breath and Swelling of Extremities. Gastrointestinal Present- Abdominal Pain and Hemorrhoids. Not Present- Bloating, Bloody Stool, Change in Bowel Habits, Chronic diarrhea, Constipation, Difficulty Swallowing, Excessive gas, Gets full quickly at meals, Indigestion, Nausea, Rectal Pain and Vomiting. Male Genitourinary Present- Nocturia and Painful Urination. Not Present- Blood in Urine, Change in Urinary Stream, Frequency, Impotence, Urgency and Urine  Leakage. Neurological Not Present- Decreased Memory, Fainting, Headaches, Numbness, Seizures, Tingling, Tremor, Trouble walking and Weakness. Psychiatric Present- Anxiety. Not Present- Bipolar, Change in Sleep Pattern, Depression, Fearful and Frequent crying. Endocrine Not Present- Cold Intolerance, Excessive Hunger, Hair Changes, Heat Intolerance and New Diabetes. Hematology Not Present- Blood Thinners, Easy Bruising, Excessive bleeding, Gland problems, HIV   and Persistent Infections.  Vitals Barron Alvine Bradford CMA; 06/14/2016 3:48 PM) 06/14/2016 3:47 PM Weight: 211 lb Height: 70in Body Surface Area: 2.14 m Body Mass Index: 30.28 kg/m  Temp.: 98.33F  Pulse: 65 (Regular)  BP: 142/72 (Sitting, Left Arm, Standard)      Physical Exam Romie Levee MD; 06/14/2016 4:01 PM)  General Mental Status-Alert. General Appearance-Not in acute distress. Build & Nutrition-Well nourished. Posture-Normal posture. Gait-Normal.  Head and Neck Head-normocephalic, atraumatic with no lesions or palpable masses. Trachea-midline.  Chest and Lung Exam Chest and lung exam reveals -on auscultation, normal breath sounds, no adventitious sounds and normal vocal resonance.  Cardiovascular Cardiovascular examination reveals -normal heart sounds, regular rate and rhythm with no murmurs and no digital clubbing, cyanosis, edema, increased warmth or tenderness.  Abdomen Inspection Inspection of the abdomen reveals - No Hernias. Palpation/Percussion Palpation and Percussion of the abdomen reveal - Soft, Non Tender, No Rigidity (guarding), No hepatosplenomegaly and No Palpable abdominal masses.  Neurologic Neurologic evaluation reveals -alert and oriented x 3 with no impairment of recent or remote memory, normal attention span and ability to concentrate, normal sensation and normal coordination.  Musculoskeletal Normal Exam - Bilateral-Upper Extremity Strength Normal and Lower  Extremity Strength Normal.    Assessment & Plan Romie Levee MD; 06/14/2016 4:00 PM)  DIVERTICULITIS OF INTESTINE WITH PERFORATION WITHOUT BLEEDING, UNSPECIFIED PART OF INTESTINAL TRACT (K57.80) Impression: DIVERTICULITIS OF INTESTINE WITH PERFORATION WITHOUT BLEEDING, UNSPECIFIED PART OF INTESTINAL TRACT (K57.80) Impression: 61 year old male who presents to the office for hospital follow-up after he was noted to have diverticulitis with microperforation. He was treated with antibiotics IV and by mouth in Sept 17. His symptoms have mostly resolved. He is having some dysuria. He is ready for surgery in mid January to remove the diseased portion of his colon. All questions were answered. CT from [place] reviewed and shows a midline adhesion to the posterior dome of the bladder. The surgery and anatomy were described to the patient as well as the risks of surgery and the possible complications. These include: Bleeding, deep abdominal infections and possible wound complications such as hernia and infection, damage to adjacent structures, leak of surgical connections, which can lead to other surgeries and possibly an ostomy, possible need for other procedures, such as abscess drains in radiology, possible prolonged hospital stay, possible diarrhea from removal of part of the colon, possible constipation from narcotics, possible bowel, bladder or sexual dysfunction if having rectal surgery, prolonged fatigue/weakness or appetite loss, possible early recurrence of of disease, possible complications of their medical problems such as heart disease or arrhythmias or lung problems, death (less than 1%). I believe the patient understands and wishes to proceed with the surgery.

## 2016-06-23 NOTE — Op Note (Signed)
06/23/2016  12:20 PM  PATIENT:  Austin Faulkner  61 y.o. male  Patient Care Team: Pcp Not In System as PCP - General  PRE-OPERATIVE DIAGNOSIS:  DIVERTICULITIS  POST-OPERATIVE DIAGNOSIS:  DIVERTICULITIS  PROCEDURE:  XI ROBOT ASSISTED SIGMOIDECTOMY   Surgeon(s): Karie Soda, MD Romie Levee, MD  ASSISTANT: Dr Michaell Cowing   ANESTHESIA:   local and general  EBL: 10oml  Total I/O In: 1550 [I.V.:1500; IV Piggyback:50] Out: 350 [Urine:250; Blood:100]  Delay start of Pharmacological VTE agent (>24hrs) due to surgical blood loss or risk of bleeding:  no  DRAINS: none   SPECIMEN:  Source of Specimen:  Sigmoid colon  DISPOSITION OF SPECIMEN:  PATHOLOGY  COUNTS:  YES  PLAN OF CARE: Admit to inpatient   PATIENT DISPOSITION:  PACU - hemodynamically stable.  INDICATION:    61 year old male who presented to the office with previous history of colovesicular fistula. He continues to have symptoms after resolution of his UTI. CT scan shows inflammation of the colon at the level of the bladder. Colonoscopy was normal.  I recommended segmental resection:  The anatomy & physiology of the digestive tract was discussed.  The pathophysiology was discussed.  Natural history risks without surgery was discussed.   I worked to give an overview of the disease and the frequent need to have multispecialty involvement.  I feel the risks of no intervention will lead to serious problems that outweigh the operative risks; therefore, I recommended a partial colectomy to remove the pathology.  Laparoscopic & open techniques were discussed.   Risks such as bleeding, infection, abscess, leak, reoperation, possible ostomy, hernia, heart attack, death, and other risks were discussed.  I noted a good likelihood this will help address the problem.   Goals of post-operative recovery were discussed as well.    The patient expressed understanding & wished to proceed with surgery.  OR FINDINGS:   Patient had colon  adherence to an abscess cavity along the left lateral wall of the bladder  The anastomosis rests 15cm from the anal verge by rigid proctoscopy.  DESCRIPTION:   Informed consent was confirmed.  The patient underwent general anaesthesia without difficulty.  The patient was positioned appropriately.  VTE prevention in place.  The patient's abdomen was clipped, prepped, & draped in a sterile fashion.  Surgical timeout confirmed our plan.  The patient was positioned in reverse Trendelenburg.  Abdominal entry was gained using a varies needle in the left upper quadrant.  Entry was clean.  I induced carbon dioxide insufflation.  I placed an 8 mm robotic port in the right upper quadrant. Camera inspection revealed no injury.  Extra ports were carefully placed under direct laparoscopic visualization.   I reflected the greater omentum and the upper abdomen the small bowel in the upper abdomen. The robot was undocked to the patient's left side. Instruments were placed under direct visualization. I scored the base of peritoneum of the right side of the mesentery of the left colon from the ligament of Treitz to the peritoneal reflection of the mid rectum.  I identified the ureter laterally on the left side of the colon. I dissected down this plane to the level of the previous fistula site. These robotic scissors to carefully dissect the colon away from the bladder wall. I did encounter a small abscess cavity that was aspirated. I then elevated the sigmoid colon out of the pelvis.   I elevated the sigmoid mesentery and enetered into the retro-mesenteric plane. We were able to identify  the left ureter and gonadal vessels. We kept those posterior within the retroperitoneum and elevated the left colon mesentery off that. I did isolated IMA pedicle but did not ligate it yet.  I continued distally and got into the avascular plane posterior to the mesorectum. This allowed me to help mobilize the rectum as well by freeing the  mesorectum off the sacrum.  I mobilized the peritoneal coverings towards the peritoneal reflection on both the right and left sides of the rectum.  I could see the right and left ureters and stayed away from them.    I skeletonized the inferior mesenteric artery pedicle.  I went down to its takeoff from the aorta.  After confirming the left ureter was out of the way, I went ahead and ligated the inferior mesenteric artery pedicle with bipolar robotic vessel sealer ~2cm above its takeoff from the aorta.   We ensured hemostasis. I skeletonized the mesorectum at the junction at the proximal rectum using blunt dissection & bipolar EnSeal.  I mobilized the left colon in a lateral to medial fashion off the line of Toldt up towards the splenic flexure to ensure good mobilization of the left colon to reach into the pelvis.  I identified the rectosigmoid junction. I divided the mesentery using the robotic vessel sealer at this level. I then used a green load robotic stapler to divide the rectosigmoid junction. I divided the remaining mesentery proximal to the inferior mesenteric artery using the robotic vessel sealer.  We then filled the bladder with 300 mL of saline to confirm no leak at the previous abscess site. Once this was confirmed, the abdomen was desufflated. I made a small Pfannenstiel incision at midline. An Alexis wound protector was placed. The colon was brought out through this site and divided using electric cautery. A pursestring device was used to create a 2-0 Prolene pursestring. This was secured with 3-0 silk sutures. A 33 mm EEA anvil was then inserted. The pursestring was tied tightly around this. This was placed back into the abdomen and the abdomen was insufflated once again. An anastomosis was created between the rectal stump and the remaining colon under laparoscopic visualization. There was no leak when tested with insufflation under water. The anastomosis rests proximal and 15 cm from anal verge.  The abdomen was irrigated. The omentum was brought down over the small bowel. The 12 mm port was removed and closed laparoscopically using a 0 Vicryl suture and a laparoscopic suture passer. All ports and the wound protector were removed. We switched to clean gowns, gloves, instruments and drapes.  I then closed the peritoneum of the Pfannenstiel incision using a running 2-0 Vicryl suture. The fascia was closed using interrupted 0 Novafil sutures. A subcutaneous tissue was reapproximated using interrupted 2-0 Vicryl sutures. The extraction site skin was closed using a running 4-0 Vicryl suture and a dressing was applied. The port sites were closed using the 4-0 Vicryl suture and skin glue. The patient tolerated procedure well and sent to the postanesthesia care unit in stable condition. All counts are correct per operating room staff.

## 2016-06-23 NOTE — Anesthesia Procedure Notes (Signed)
Procedure Name: Intubation Date/Time: 06/23/2016 8:38 AM Performed by: Leroy Libman L Patient Re-evaluated:Patient Re-evaluated prior to inductionOxygen Delivery Method: Circle system utilized Preoxygenation: Pre-oxygenation with 100% oxygen Intubation Type: IV induction Ventilation: Mask ventilation without difficulty and Oral airway inserted - appropriate to patient size Laryngoscope Size: Miller and 3 Grade View: Grade I Tube type: Oral Tube size: 8.0 mm Number of attempts: 1 Airway Equipment and Method: Stylet Placement Confirmation: ETT inserted through vocal cords under direct vision,  positive ETCO2 and breath sounds checked- equal and bilateral Secured at: 21 cm Tube secured with: Tape Dental Injury: Teeth and Oropharynx as per pre-operative assessment

## 2016-06-23 NOTE — Anesthesia Postprocedure Evaluation (Signed)
Anesthesia Post Note  Patient: Austin Faulkner  Procedure(s) Performed: Procedure(s) (LRB): XI ROBOT ASSISTED LAPAROSCOPIC SIGMOIDECTOMY (N/A)  Patient location during evaluation: PACU Anesthesia Type: General Level of consciousness: awake and alert Pain management: pain level controlled Vital Signs Assessment: post-procedure vital signs reviewed and stable Respiratory status: spontaneous breathing, nonlabored ventilation, respiratory function stable and patient connected to nasal cannula oxygen Cardiovascular status: blood pressure returned to baseline and stable Postop Assessment: no signs of nausea or vomiting Anesthetic complications: no       Last Vitals:  Vitals:   06/23/16 1321 06/23/16 1431  BP: 132/82 128/67  Pulse: 65 68  Resp: 16 16  Temp: 36.8 C 36.9 C    Last Pain:  Vitals:   06/23/16 1431  TempSrc: Oral  PainSc:                  Khamya Topp DAVID

## 2016-06-24 LAB — BASIC METABOLIC PANEL
Anion gap: 8 (ref 5–15)
BUN: 10 mg/dL (ref 6–20)
CALCIUM: 8.5 mg/dL — AB (ref 8.9–10.3)
CO2: 26 mmol/L (ref 22–32)
CREATININE: 1.06 mg/dL (ref 0.61–1.24)
Chloride: 105 mmol/L (ref 101–111)
GFR calc Af Amer: >60 mL/min (ref >60)
Glucose, Bld: 111 mg/dL — ABNORMAL HIGH (ref 65–99)
Potassium: 3.8 mmol/L (ref 3.5–5.1)
Sodium: 139 mmol/L (ref 135–145)

## 2016-06-24 LAB — CBC
HCT: 37.1 % — ABNORMAL LOW (ref 39.0–52.0)
Hemoglobin: 12.3 g/dL — ABNORMAL LOW (ref 13.0–17.0)
MCH: 28.9 pg (ref 26.0–34.0)
MCHC: 33.2 g/dL (ref 30.0–36.0)
MCV: 87.1 fL (ref 78.0–100.0)
PLATELETS: 299 10*3/uL (ref 150–400)
RBC: 4.26 MIL/uL (ref 4.22–5.81)
RDW: 13.9 % (ref 11.5–15.5)
WBC: 10.8 10*3/uL — AB (ref 4.0–10.5)

## 2016-06-24 NOTE — Progress Notes (Signed)
Patient and wife concerned about redness around one of patients abdominal incision (RLQ ) ,area was assess and marked. We will continue to monitor

## 2016-06-24 NOTE — Progress Notes (Signed)
1 Day Post-Op robotic sigmoidectomy Subjective: Having some bloating but otherwise doing ok, passing flatus, tolerating clears  Objective: Vital signs in last 24 hours: Temp:  [97.5 F (36.4 C)-98.6 F (37 C)] 98.2 F (36.8 C) (02/01 0523) Pulse Rate:  [57-77] 70 (02/01 0523) Resp:  [16-20] 16 (02/01 0523) BP: (100-150)/(61-88) 104/63 (02/01 0523) SpO2:  [98 %-100 %] 98 % (02/01 0523)   Intake/Output from previous day: 01/31 0701 - 02/01 0700 In: 4037.5 [P.O.:1340; I.V.:2647.5; IV Piggyback:50] Out: 3550 [Urine:3450; Blood:100] Intake/Output this shift: No intake/output data recorded.   General appearance: alert and cooperative GI: normal findings: soft, non-tender  Incision: soft, mildly distended  Lab Results:   Recent Labs  06/22/16 0821 06/24/16 0437  WBC 8.1 10.8*  HGB 13.4 12.3*  HCT 40.7 37.1*  PLT 349 299   BMET  Recent Labs  06/22/16 0821 06/24/16 0437  NA 140 139  K 3.9 3.8  CL 105 105  CO2 28 26  GLUCOSE 99 111*  BUN 14 10  CREATININE 1.01 1.06  CALCIUM 9.3 8.5*   PT/INR No results for input(s): LABPROT, INR in the last 72 hours. ABG No results for input(s): PHART, HCO3 in the last 72 hours.  Invalid input(s): PCO2, PO2  MEDS, Scheduled . acetaminophen  1,000 mg Oral Q6H  . alvimopan  12 mg Oral BID  . enoxaparin (LOVENOX) injection  40 mg Subcutaneous Q24H  . hydrochlorothiazide  25 mg Oral Daily  . hydroxychloroquine  200 mg Oral BID  . magnesium gluconate  500 mg Oral Daily  . mometasone-formoterol  2 puff Inhalation BID  . pantoprazole  40 mg Oral Daily    Studies/Results: No results found.  Assessment: s/p Procedure(s): XI ROBOT ASSISTED LAPAROSCOPIC SIGMOIDECTOMY Patient Active Problem List   Diagnosis Date Noted  . Diverticular disease 06/23/2016  . Essential hypertension 03/05/2016  . Rheumatoid arthritis of multiple sites without rheumatoid factor (HCC) 03/05/2016  . Diverticulitis of colon with perforation  03/04/2016  . External hemorrhoid, bleeding 02/14/2012  . GERD (gastroesophageal reflux disease) 02/14/2012  . ACNE VULGARIS, FACIAL 07/03/2010  . BACK PAIN, ACUTE 07/03/2010  . TESTICULAR MASS, LEFT 02/25/2010  . DEGENERATIVE JOINT DISEASE 11/13/2009  . SKIN LESION 11/11/2009  . SYSTOLIC MURMUR 11/11/2009  . Moderate persistent asthma, uncomplicated 07/21/2009    Expected post op course  Plan: d/c foley  Ambulate Advance to soft diet Decrease MIV   LOS: 1 day     .Vanita Panda, MD [facility] Surgery, [place] [phone]   06/24/2016 7:36 AM

## 2016-06-25 LAB — BASIC METABOLIC PANEL
Anion gap: 6 (ref 5–15)
BUN: 8 mg/dL (ref 6–20)
CALCIUM: 8.7 mg/dL — AB (ref 8.9–10.3)
CO2: 29 mmol/L (ref 22–32)
CREATININE: 1.04 mg/dL (ref 0.61–1.24)
Chloride: 105 mmol/L (ref 101–111)
GFR calc Af Amer: >60 mL/min (ref >60)
GFR calc non Af Amer: >60 mL/min (ref >60)
GLUCOSE: 100 mg/dL — AB (ref 65–99)
Potassium: 4.2 mmol/L (ref 3.5–5.1)
Sodium: 140 mmol/L (ref 135–145)

## 2016-06-25 LAB — CBC
HCT: 37.6 % — ABNORMAL LOW (ref 39.0–52.0)
Hemoglobin: 12.6 g/dL — ABNORMAL LOW (ref 13.0–17.0)
MCH: 29.4 pg (ref 26.0–34.0)
MCHC: 33.5 g/dL (ref 30.0–36.0)
MCV: 87.6 fL (ref 78.0–100.0)
PLATELETS: 271 10*3/uL (ref 150–400)
RBC: 4.29 MIL/uL (ref 4.22–5.81)
RDW: 13.9 % (ref 11.5–15.5)
WBC: 8.8 10*3/uL (ref 4.0–10.5)

## 2016-06-25 MED ORDER — SODIUM CHLORIDE 0.9% FLUSH: 3.0000 mL | INTRAVENOUS | Status: DC | PRN

## 2016-06-25 MED ORDER — SODIUM CHLORIDE 0.9% FLUSH
3.0000 mL | Freq: Two times a day (BID) | INTRAVENOUS | Status: DC
  Administered 2016-06-25 – 2016-06-26 (×2): 3 mL via INTRAVENOUS

## 2016-06-25 MED ORDER — OXYCODONE HCL 5 MG PO TABS: 5.0000 mg | ORAL_TABLET | ORAL | 0 refills | Status: DC | PRN

## 2016-06-25 NOTE — Progress Notes (Signed)
Pharmacy Brief Note - Alvimopan (Entereg)  The standing order set for alvimopan (Entereg) now includes an automatic order to discontinue the drug after the patient has had a bowel movement. The change was approved by the Pharmacy & Therapeutics Committee and the Medical Executive Committee.   This patient has had bowel movements documented by nursing. Therefore, alvimopan has been discontinued. If there are questions, please contact the pharmacy at [phone].   Thank you-  Dorna Leitz, PharmD, BCPS 06/25/2016 10:59 AM

## 2016-06-25 NOTE — Progress Notes (Signed)
2 Days Post-Op robotic sigmoidectomy Subjective: Tolerating a soft diet and pain controlled.  Ambulating well in hall.  Having BM's  Objective: Vital signs in last 24 hours: Temp:  [98.1 F (36.7 C)-99.1 F (37.3 C)] 98.1 F (36.7 C) (02/02 0526) Pulse Rate:  [53-68] 53 (02/02 0526) Resp:  [16-18] 18 (02/02 0526) BP: (113-134)/(61-70) 129/69 (02/02 0526) SpO2:  [99 %-100 %] 99 % (02/02 0826)   Intake/Output from previous day: 02/01 0701 - 02/02 0700 In: 1528.3 [P.O.:1180; I.V.:348.3] Out: 1600 [Urine:1600] Intake/Output this shift: Total I/O In: 360 [P.O.:360] Out: -    General appearance: alert and cooperative GI: normal findings: soft, non-tender  Abd: soft, non-distended Inc: clean, dry, intact  Lab Results:   Recent Labs  06/24/16 0437 06/25/16 0458  WBC 10.8* 8.8  HGB 12.3* 12.6*  HCT 37.1* 37.6*  PLT 299 271   BMET  Recent Labs  06/24/16 0437 06/25/16 0458  NA 139 140  K 3.8 4.2  CL 105 105  CO2 26 29  GLUCOSE 111* 100*  BUN 10 8  CREATININE 1.06 1.04  CALCIUM 8.5* 8.7*   PT/INR No results for input(s): LABPROT, INR in the last 72 hours. ABG No results for input(s): PHART, HCO3 in the last 72 hours.  Invalid input(s): PCO2, PO2  MEDS, Scheduled . acetaminophen  1,000 mg Oral Q6H  . enoxaparin (LOVENOX) injection  40 mg Subcutaneous Q24H  . hydrochlorothiazide  25 mg Oral Daily  . hydroxychloroquine  200 mg Oral BID  . mometasone-formoterol  2 puff Inhalation BID  . pantoprazole  40 mg Oral Daily  . sodium chloride flush  3 mL Intravenous Q12H    Studies/Results: No results found.  Assessment: s/p Procedure(s): XI ROBOT ASSISTED LAPAROSCOPIC SIGMOIDECTOMY Patient Active Problem List   Diagnosis Date Noted  . Diverticular disease 06/23/2016  . Essential hypertension 03/05/2016  . Rheumatoid arthritis of multiple sites without rheumatoid factor (HCC) 03/05/2016  . Diverticulitis of colon with perforation 03/04/2016  . External  hemorrhoid, bleeding 02/14/2012  . GERD (gastroesophageal reflux disease) 02/14/2012  . ACNE VULGARIS, FACIAL 07/03/2010  . BACK PAIN, ACUTE 07/03/2010  . TESTICULAR MASS, LEFT 02/25/2010  . DEGENERATIVE JOINT DISEASE 11/13/2009  . SKIN LESION 11/11/2009  . SYSTOLIC MURMUR 11/11/2009  . Moderate persistent asthma, uncomplicated 07/21/2009    Expected post op course  Plan: Ambulate Cont soft diet SL MIV D/c in AM  LOS: 2 days     .Vanita Panda, MD [facility], [place] [phone]   06/25/2016 11:38 AM

## 2016-06-25 NOTE — Discharge Instructions (Signed)

## 2016-06-26 NOTE — Care Management Important Message (Signed)
Important Message  Patient Details  Name: TANUJ MULLENS MRN: [id number] Date of Birth: October 22, 1955   Medicare Important Message Given:  Yes    Elliot Cousin, RN 06/26/2016, 9:46 AM

## 2016-06-26 NOTE — Progress Notes (Signed)
Pt was discharged home today. Instructions were reviewed with patient, and questions were answered. Pt was taken to main entrance via wheelchair by NT.  

## 2016-06-26 NOTE — Discharge Summary (Signed)
Physician Discharge Summary  Patient ID: Austin Faulkner MRN: [id number] DOB/AGE: March 25, 1956 61 y.o.  Admit date: 06/23/2016 Discharge date: 06/26/2016  Admission Diagnoses:  Diverticular disease  Discharge Diagnoses:  same  Active Problems:   Diverticular disease   Surgery:  Robotic colon resection  Discharged Condition: improved  Hospital Course:   Had surgery with colon resection.  Begun on clear liquids and advanced which he tolerated.  Passed flatus and liquid stool.  Ready for discharge  Consults: none  Significant Diagnostic Studies: path pending    Discharge Exam: Blood pressure 129/77, pulse (!) 54, temperature 98.1 F (36.7 C), temperature source Oral, resp. rate 18, height 5\' 10"  (1.778 m), weight 89.8 kg (198 lb), SpO2 100 %. Incisions OK  Disposition: 01-Home or Self Care  Discharge Instructions    Call MD for:  persistant nausea and vomiting    Complete by:  As directed    Call MD for:  temperature >100.4    Complete by:  As directed    Diet - low sodium heart healthy    Complete by:  As directed    Increase activity slowly    Complete by:  As directed      Allergies as of 06/26/2016   No Known Allergies     Medication List    STOP taking these medications   amoxicillin-clavulanate 875-125 MG tablet Commonly known as:  AUGMENTIN   docusate sodium 100 MG capsule Commonly known as:  COLACE     TAKE these medications   acetaminophen 325 MG tablet Commonly known as:  TYLENOL Take 1-2 tablets (325-650 mg total) by mouth every 6 (six) hours as needed for fever, headache, mild pain or moderate pain.   budesonide-formoterol 80-4.5 MCG/ACT inhaler Commonly known as:  SYMBICORT Inhale 2 puffs into the lungs 2 (two) times daily.   COD LIVER OIL PO Take 5 mLs by mouth daily.   FLAX SEED OIL PO Take 5 mLs by mouth 2 (two) times daily.   fluticasone 50 MCG/ACT nasal spray Commonly known as:  FLONASE Place 1-2 sprays into both nostrils daily as  needed for allergies or rhinitis.   hydrochlorothiazide 25 MG tablet Commonly known as:  HYDRODIURIL Take 25 mg by mouth daily.   hydroxychloroquine 200 MG tablet Commonly known as:  PLAQUENIL Take 200 mg by mouth 2 (two) times daily.   ibuprofen 800 MG tablet Commonly known as:  ADVIL,MOTRIN Take 800 mg by mouth daily.   loratadine 10 MG tablet Commonly known as:  CLARITIN Take 10 mg by mouth daily as needed for allergies.   magnesium gluconate 500 MG tablet Commonly known as:  MAGONATE Take 500 mg by mouth daily.   montelukast 10 MG tablet Commonly known as:  SINGULAIR Take 10 mg by mouth at bedtime as needed (allergies).   multivitamin with minerals Tabs tablet Take 1 tablet by mouth daily.   NAPHCON-A OP Apply 1 drop to eye daily as needed (allergies).   omeprazole 20 MG capsule Commonly known as:  PRILOSEC Take 20 mg by mouth daily.   oxyCODONE 5 MG immediate release tablet Commonly known as:  Oxy IR/ROXICODONE Take 1 tablet (5 mg total) by mouth every 4 (four) hours as needed for moderate pain or severe pain.   PROVENTIL HFA 108 (90 Base) MCG/ACT inhaler Generic drug:  albuterol Inhale 2 puffs into the lungs every 6 (six) hours as needed for wheezing or shortness of breath.   TURMERIC CURCUMIN PO Take 900 mg by mouth daily.  vitamin C 1000 MG tablet Take 1,000 mg by mouth daily.      Follow-up Information    Vanita Panda., MD. Schedule an appointment as soon as possible for a visit in 2 week(s).   Specialty:  General Surgery Contact information: [address] [place] [address] [phone]           Signed: Valarie Merino 06/26/2016, 8:52 AM

## 2016-06-26 NOTE — Care Management Note (Signed)
Case Management Note  Patient Details  Name: Austin Faulkner MRN: [id number] Date of Birth: 16-Oct-1955  Subjective/Objective:   Diverticular Disease,  Colon resection                Action/Plan: Discharge Planning: AVS reviewed: Spoke to pt and was independent prior to hospital stay. Pt states he goes to the [facility].   Expected Discharge Date:  06/26/16               Expected Discharge Plan:  Home/Self Care  In-House Referral:  NA  Discharge planning Services  CM Consult  Post Acute Care Choice:  NA Choice offered to:  NA  DME Arranged:  N/A DME Agency:  NA  HH Arranged:  NA HH Agency:  NA  Status of Service:  Completed, signed off  If discussed at Long Length of Stay Meetings, dates discussed:    Additional Comments:  Elliot Cousin, RN 06/26/2016, 9:51 AM

## 2017-03-05 ENCOUNTER — Encounter (HOSPITAL_BASED_OUTPATIENT_CLINIC_OR_DEPARTMENT_OTHER): Payer: Self-pay | Admitting: Emergency Medicine

## 2017-03-05 ENCOUNTER — Emergency Department (HOSPITAL_BASED_OUTPATIENT_CLINIC_OR_DEPARTMENT_OTHER)
Admission: EM | Admit: 2017-03-05 | Discharge: 2017-03-05 | Disposition: A | Discharge status: Home / Self Care | Payer: Medicare Other | Attending: Emergency Medicine | Admitting: Emergency Medicine

## 2017-03-05 ENCOUNTER — Emergency Department (HOSPITAL_BASED_OUTPATIENT_CLINIC_OR_DEPARTMENT_OTHER): Payer: Medicare Other

## 2017-03-05 DIAGNOSIS — Z79899 Other long term (current) drug therapy: Secondary | ICD-10-CM | POA: Diagnosis not present

## 2017-03-05 DIAGNOSIS — W108XXA Fall (on) (from) other stairs and steps, initial encounter: Secondary | ICD-10-CM | POA: Insufficient documentation

## 2017-03-05 DIAGNOSIS — Y9389 Activity, other specified: Secondary | ICD-10-CM | POA: Diagnosis not present

## 2017-03-05 DIAGNOSIS — J45909 Unspecified asthma, uncomplicated: Secondary | ICD-10-CM | POA: Diagnosis not present

## 2017-03-05 DIAGNOSIS — Y998 Other external cause status: Secondary | ICD-10-CM | POA: Diagnosis not present

## 2017-03-05 DIAGNOSIS — F329 Major depressive disorder, single episode, unspecified: Secondary | ICD-10-CM | POA: Diagnosis not present

## 2017-03-05 DIAGNOSIS — Z96653 Presence of artificial knee joint, bilateral: Secondary | ICD-10-CM | POA: Insufficient documentation

## 2017-03-05 DIAGNOSIS — S20212A Contusion of left front wall of thorax, initial encounter: Secondary | ICD-10-CM

## 2017-03-05 DIAGNOSIS — S299XXA Unspecified injury of thorax, initial encounter: Secondary | ICD-10-CM | POA: Diagnosis present

## 2017-03-05 DIAGNOSIS — S20219A Contusion of unspecified front wall of thorax, initial encounter: Secondary | ICD-10-CM | POA: Diagnosis not present

## 2017-03-05 DIAGNOSIS — Y92008 Other place in unspecified non-institutional (private) residence as the place of occurrence of the external cause: Secondary | ICD-10-CM | POA: Diagnosis not present

## 2017-03-05 DIAGNOSIS — I10 Essential (primary) hypertension: Secondary | ICD-10-CM | POA: Insufficient documentation

## 2017-03-05 DIAGNOSIS — Z7722 Contact with and (suspected) exposure to environmental tobacco smoke (acute) (chronic): Secondary | ICD-10-CM | POA: Diagnosis not present

## 2017-03-05 DIAGNOSIS — J449 Chronic obstructive pulmonary disease, unspecified: Secondary | ICD-10-CM | POA: Diagnosis not present

## 2017-03-05 MED ORDER — TRAMADOL HCL 50 MG PO TABS: 50.0000 mg | ORAL_TABLET | Freq: Four times a day (QID) | ORAL | 0 refills | Status: DC | PRN

## 2017-03-05 NOTE — ED Triage Notes (Signed)
Patient reports that he has pain to his left axilla region after a fall 2 -3 days ago. The patient states that he fell onto the side  - reports that it hurts to cough and take a deep breath.

## 2017-03-05 NOTE — Discharge Instructions (Signed)
Contact a health care provider if: °You have increased bruising or swelling. °You have pain that is not controlled with treatment. °You have a fever. °Get help right away if: °You have difficulty breathing or shortness of breath. °You develop a continual cough, or you cough up thick or bloody sputum. °You feel sick to your stomach (nauseous), you throw up (vomit), or you have abdominal pain. °

## 2017-03-05 NOTE — ED Provider Notes (Signed)
[facility] Provider Note   CSN: [id number] Arrival date & time: 03/05/17  1218     History   Chief Complaint Chief Complaint  Patient presents with  . Fall    HPI Austin Faulkner is a 61 y.o. male.He presents emergency Department with chief complaint of rib pain. Patient states that 2 days ago he went outside after the rain and slipped on his stairs falling directly onto his left side. He denies hitting her head or losing consciousness. His wife is a Marine scientist and has been evaluating him. He complains of pain on the anterior and lateral chest wall. He has no pain at rest but has pain whenever he tries to move or use his upper extremities or take deep breaths. He has been using Motrin and Tylenol without relief of his symptoms. He has an incentive spirometer at home and has been using that. Per his wife's instruction. He denies hemoptysis, shortness of breath, appears seizure in the upper extremities. He has a history of COPD, rheumatoid arthritis. He has been using his inhaler daily as directed without any increase in his respiratory symptoms.  HPI  Past Medical History:  Diagnosis Date  . Asthma   . COPD (chronic obstructive pulmonary disease) ([place])   . Depression   . Diverticulitis   . Dyspnea    secondary to seasonal allergies; or illness  . GERD (gastroesophageal reflux disease)   . Heart murmur    slight since birth  . Hypertension   . Pneumonia yrs ago  . Rheumatoid arthritis ([place])   . Seasonal allergies   . Shoulder dislocation     Patient Active Problem List   Diagnosis Date Noted  . Diverticular disease 06/23/2016  . Essential hypertension 03/05/2016  . Rheumatoid arthritis of multiple sites without rheumatoid factor ([place]) 03/05/2016  . Diverticulitis of colon with perforation 03/04/2016  . External hemorrhoid, bleeding 02/14/2012  . GERD (gastroesophageal reflux disease) 02/14/2012  . ACNE VULGARIS, FACIAL 07/03/2010  . BACK PAIN, ACUTE 07/03/2010    . TESTICULAR MASS, LEFT 02/25/2010  . DEGENERATIVE JOINT DISEASE 11/13/2009  . SKIN LESION 11/11/2009  . SYSTOLIC MURMUR 91/79/1505  . Moderate persistent asthma, uncomplicated 69/79/4801    Past Surgical History:  Procedure Laterality Date  . knee arthroscopy and cartlidge repair Bilateral    5 on right and 5 on left  . TOTAL KNEE ARTHROPLASTY Bilateral        Home Medications    Prior to Admission medications   Medication Sig Start Date End Date Taking? Authorizing Provider  acetaminophen (TYLENOL) 325 MG tablet Take 1-2 tablets (325-650 mg total) by mouth every 6 (six) hours as needed for fever, headache, mild pain or moderate pain. Patient not taking: Reported on 06/16/2016 03/08/16   Nita Sells, MD  albuterol (PROVENTIL HFA) 108 (90 BASE) MCG/ACT inhaler Inhale 2 puffs into the lungs every 6 (six) hours as needed for wheezing or shortness of breath.     [provider]  Ascorbic Acid (VITAMIN C) 1000 MG tablet Take 1,000 mg by mouth daily.    [provider]  budesonide-formoterol (SYMBICORT) 80-4.5 MCG/ACT inhaler Inhale 2 puffs into the lungs 2 (two) times daily.     [provider]  COD LIVER OIL PO Take 5 mLs by mouth daily.    [provider]  Flaxseed, Linseed, (FLAX SEED OIL PO) Take 5 mLs by mouth 2 (two) times daily.    [provider]  fluticasone (FLONASE) 50 MCG/ACT nasal spray  Place 1-2 sprays into both nostrils daily as needed for allergies or rhinitis.    [provider]  hydrochlorothiazide (HYDRODIURIL) 25 MG tablet Take 25 mg by mouth daily.    [provider]  hydroxychloroquine (PLAQUENIL) 200 MG tablet Take 200 mg by mouth 2 (two) times daily.    [provider]  ibuprofen (ADVIL,MOTRIN) 800 MG tablet Take 800 mg by mouth daily.    [provider]  loratadine (CLARITIN) 10 MG tablet Take 10 mg by mouth daily as needed for allergies.     [provider]   magnesium gluconate (MAGONATE) 500 MG tablet Take 500 mg by mouth daily.    [provider]  montelukast (SINGULAIR) 10 MG tablet Take 10 mg by mouth at bedtime as needed (allergies).     [provider]  Multiple Vitamin (MULTIVITAMIN WITH MINERALS) TABS tablet Take 1 tablet by mouth daily.    [provider]  Naphazoline-Pheniramine (NAPHCON-A OP) Apply 1 drop to eye daily as needed (allergies).    [provider]  omeprazole (PRILOSEC) 20 MG capsule Take 20 mg by mouth daily.     [provider]  oxyCODONE (OXY IR/ROXICODONE) 5 MG immediate release tablet Take 1 tablet (5 mg total) by mouth every 4 (four) hours as needed for moderate pain or severe pain. 10/25/82   Leighton Ruff, MD  TURMERIC CURCUMIN PO Take 900 mg by mouth daily.     [provider]    Family History Family History  Problem Relation Age of Onset  . Lung cancer Mother   . Heart attack Father 56       "healthier than I am"  . Lupus Sister   . Stroke Paternal Grandmother   . Multiple myeloma Sister        BMT at [facility] History  Substance Use Topics  . Smoking status: Passive Smoke Exposure - Never Smoker  . Smokeless tobacco: Never Used  . Alcohol use No     Allergies   Patient has no known allergies.   Review of Systems Review of Systems  Ten systems reviewed and are negative for acute change, except as noted in the HPI.   Physical Exam Updated Vital Signs BP (!) 145/90 (BP Location: Left Arm)   Pulse 62   Temp 98.4 F (36.9 C) (Oral)   Resp 20   Ht 5' 10"  (1.778 m)   Wt 93 kg (205 lb)   SpO2 99%   BMI 29.41 kg/m   Physical Exam  Constitutional: He appears well-developed and well-nourished. No distress.  HENT:  Head: Normocephalic and atraumatic.  Eyes: Pupils are equal, round, and reactive to light. Conjunctivae and EOM are normal. No scleral icterus.  Neck: Normal range of motion. Neck supple.  Cardiovascular:  Normal rate, regular rhythm and normal heart sounds.   Pulmonary/Chest: Effort normal and breath sounds normal. No respiratory distress. He exhibits tenderness and deformity. He exhibits no crepitus, no edema and no swelling.    Abdominal: Soft. There is no tenderness.  Musculoskeletal: He exhibits no edema.  Neurological: He is alert.  Skin: Skin is warm and dry. He is not diaphoretic.  Psychiatric: His behavior is normal.  Nursing note and vitals reviewed.    ED Treatments / Results  Labs (all labs ordered are listed, but only abnormal results are displayed) Labs Reviewed - No data to display  EKG  EKG Interpretation None       Radiology  Dg Chest 2 View  Result Date: 03/05/2017 CLINICAL DATA:  Fall, left chest pain EXAM: CHEST  2 VIEW COMPARISON:  08/18/2009 FINDINGS: Lungs are clear.  No pleural effusion or pneumothorax. Heart is normal in size. Mild degenerative changes of the visualized thoracolumbar spine. IMPRESSION: No evidence of acute cardiopulmonary disease. Electronically Signed   By: Julian Hy M.D.   On: 03/05/2017 14:11    Procedures Procedures (including critical care time)  Medications Ordered in ED Medications - No data to display   Initial Impression / Assessment and Plan / ED Course  I have reviewed the triage vital signs and the nursing notes.  Pertinent labs & imaging results that were available during my care of the patient were reviewed by me and considered in my medical decision making (see chart for details).     Patient with chest wall tenderness. Likely. Contusion. I doubt any emergent cause of his pain such as cardiac contusion, pneumothorax,, pulmonary embolus. He has been pretty sore chest pain with palpation. X-ray is negative. May be a potential occult fracture however this will be treated the same way. He has an incentive spirometer at home and knows how to use it. Patient was discharged with tramadol, supportive care discussed.  He appears appropriate for discharge at this time  Final Clinical Impressions(s) / ED Diagnoses   Final diagnoses:  Rib contusion, left, initial encounter    New Prescriptions New Prescriptions   No medications on file     Margarita Mail, PA-C 03/05/17 1454    Orlie Dakin, MD 03/05/17 1616

## 2017-06-21 ENCOUNTER — Other Ambulatory Visit: Payer: Self-pay

## 2017-06-21 DIAGNOSIS — I83893 Varicose veins of bilateral lower extremities with other complications: Secondary | ICD-10-CM

## 2017-07-05 ENCOUNTER — Ambulatory Visit (INDEPENDENT_AMBULATORY_CARE_PROVIDER_SITE_OTHER): Payer: Medicare Other | Admitting: Vascular Surgery

## 2017-07-05 ENCOUNTER — Ambulatory Visit (HOSPITAL_COMMUNITY)
Admission: RE | Admit: 2017-07-05 | Discharge: 2017-07-05 | Disposition: A | Discharge status: Home / Self Care | Payer: Medicare Other | Source: Ambulatory Visit | Attending: Vascular Surgery | Admitting: Vascular Surgery

## 2017-07-05 ENCOUNTER — Encounter: Payer: Self-pay | Admitting: Vascular Surgery

## 2017-07-05 ENCOUNTER — Other Ambulatory Visit: Payer: Self-pay | Admitting: *Deleted

## 2017-07-05 VITALS — BP 132/88 | HR 66 | Temp 97.6°F | Resp 16 | Ht 70.0 in | Wt 208.0 lb

## 2017-07-05 DIAGNOSIS — I83893 Varicose veins of bilateral lower extremities with other complications: Secondary | ICD-10-CM | POA: Diagnosis not present

## 2017-07-05 DIAGNOSIS — M7989 Other specified soft tissue disorders: Secondary | ICD-10-CM | POA: Insufficient documentation

## 2017-07-05 DIAGNOSIS — I83891 Varicose veins of right lower extremities with other complications: Secondary | ICD-10-CM | POA: Diagnosis not present

## 2017-07-05 DIAGNOSIS — I83811 Varicose veins of right lower extremities with pain: Secondary | ICD-10-CM

## 2017-07-05 NOTE — Progress Notes (Signed)
[facility]  Patient name: Austin Faulkner MRN: [id number] DOB: [birth year] Sex: male  REASON FOR CONSULT: Evaluation of painful varicosities right leg  HPI: Austin Faulkner is a 62 y.o. male, who is here today for discussion of painful varicosity in his right leg.  He reports this is been present for many years and he has been extremely compliant with thigh-high 20-30 mmHg graduated compression garments.  He wears these bilaterally.  He reports that despite this he is having increased pain itching and burning over the varicosities in his medial thigh and medial calf and extensive telangiectasias down onto his ankles.  He has had no history of DVT.  He does elevate his legs when possible.  Past Medical History:  Diagnosis Date  . Asthma   . COPD (chronic obstructive pulmonary disease) ([place])   . Depression   . Diverticulitis   . Dyspnea    secondary to seasonal allergies; or illness  . GERD (gastroesophageal reflux disease)   . Heart murmur    slight since birth  . Hypertension   . Pneumonia yrs ago  . Rheumatoid arthritis ([place])   . Seasonal allergies   . Shoulder dislocation     Family History  Problem Relation Age of Onset  . Lung cancer Mother   . Heart attack Father 31       "healthier than I am"  . Lupus Sister   . Stroke Paternal Grandmother   . Multiple myeloma Sister        BMT at [facility]: Social History   Socioeconomic History  . Marital status: Married    Spouse name: Not on file  . Number of children: Not on file  . Years of education: Not on file  . Highest education level: Not on file  Social Needs  . Financial resource strain: Not on file  . Food insecurity - worry: Not on file  . Food insecurity - inability: Not on file  . Transportation needs - medical: Not on file  . Transportation needs - non-medical: Not on file  Occupational History  . Not on file  Tobacco Use    . Smoking status: Passive Smoke Exposure - Never Smoker  . Smokeless tobacco: Never Used  Substance and Sexual Activity  . Alcohol use: No  . Drug use: No  . Sexual activity: Not on file  Other Topics Concern  . Not on file  Social History Narrative  . Not on file    No Known Allergies  Current Outpatient Medications  Medication Sig Dispense Refill  . albuterol (PROVENTIL HFA) 108 (90 BASE) MCG/ACT inhaler Inhale 2 puffs into the lungs every 6 (six) hours as needed for wheezing or shortness of breath.     . budesonide-formoterol (SYMBICORT) 160-4.5 MCG/ACT inhaler Inhale 2 puffs into the lungs 2 (two) times daily.     . COD LIVER OIL PO Take 5 mLs by mouth daily.    . Flaxseed, Linseed, (FLAX SEED OIL PO) Take 5 mLs by mouth 2 (two) times daily.    . fluticasone (FLONASE) 50 MCG/ACT nasal spray Place 1-2 sprays into both nostrils daily as needed for allergies or rhinitis.    . hydrochlorothiazide (HYDRODIURIL) 25 MG tablet Take 25 mg by mouth daily.    . hydroxychloroquine (PLAQUENIL) 200 MG tablet Take 200 mg by mouth 2 (two) times daily.    Marland Kitchen ibuprofen (ADVIL,MOTRIN) 800 MG tablet Take 800 mg by mouth  daily.    . magnesium gluconate (MAGONATE) 500 MG tablet Take 500 mg by mouth daily.    . montelukast (SINGULAIR) 10 MG tablet Take 10 mg by mouth at bedtime as needed (allergies).     . Multiple Vitamin (MULTIVITAMIN WITH MINERALS) TABS tablet Take 1 tablet by mouth daily.    Marland Kitchen omeprazole (PRILOSEC) 20 MG capsule Take 20 mg by mouth daily.     Marland Kitchen PARoxetine (PAXIL) 20 MG tablet Take 20 mg by mouth daily.    . tamsulosin (FLOMAX) 0.4 MG CAPS capsule Take 0.4 mg by mouth.    . traZODone (DESYREL) 100 MG tablet Take 100 mg by mouth at bedtime.    . TURMERIC CURCUMIN PO Take 900 mg by mouth daily.     Marland Kitchen acetaminophen (TYLENOL) 325 MG tablet Take 1-2 tablets (325-650 mg total) by mouth every 6 (six) hours as needed for fever, headache, mild pain or moderate pain. (Patient not taking:  Reported on 07/05/2017)    . Ascorbic Acid (VITAMIN C) 1000 MG tablet Take 1,000 mg by mouth daily.    Marland Kitchen loratadine (CLARITIN) 10 MG tablet Take 10 mg by mouth daily as needed for allergies.     Mable Fill (NAPHCON-A OP) Apply 1 drop to eye daily as needed (allergies).    Marland Kitchen oxyCODONE (OXY IR/ROXICODONE) 5 MG immediate release tablet Take 1 tablet (5 mg total) by mouth every 4 (four) hours as needed for moderate pain or severe pain. (Patient not taking: Reported on 07/05/2017) 20 tablet 0  . traMADol (ULTRAM) 50 MG tablet Take 1 tablet (50 mg total) by mouth every 6 (six) hours as needed. (Patient not taking: Reported on 07/05/2017) 15 tablet 0   No current facility-administered medications for this visit.     REVIEW OF SYSTEMS:  _0  denotes positive finding, _1  denotes negative finding Cardiac  Comments:  Chest pain or chest pressure:    Shortness of breath upon exertion:    Short of breath when lying flat:    Irregular heart rhythm:        Vascular    Pain in calf, thigh, or hip brought on by ambulation:    Pain in feet at night that wakes you up from your sleep:     Blood clot in your veins:    Leg swelling:         Pulmonary    Oxygen at home:    Productive cough:     Wheezing:  x       Neurologic    Sudden weakness in arms or legs:     Sudden numbness in arms or legs:     Sudden onset of difficulty speaking or slurred speech:    Temporary loss of vision in one eye:     Problems with dizziness:         Gastrointestinal    Blood in stool:     Vomited blood:         Genitourinary    Burning when urinating:     Blood in urine:        Psychiatric    Major depression:         Hematologic    Bleeding problems:    Problems with blood clotting too easily:        Skin    Rashes or ulcers:        Constitutional    Fever or chills:      PHYSICAL EXAM: Vitals:   07/05/17  1216  BP: 132/88  Pulse: 66  Resp: 16  Temp: 97.6 F (36.4 C)  SpO2: 97%    Weight: 208 lb (94.3 kg)  Height: _0  (1.778 m)    GENERAL: The patient is a well-nourished male, in no acute distress. The vital signs are documented above. CARDIOVASCULAR: Palpable dorsalis pedis pulses bilaterally.  He does have scattered telangiectasia over his medial knee and medial ankle left leg.  He has extensive varicosities throughout his medial thigh extending onto his calf on the right leg and also extensive telangiectasia over these areas and also extending is raised above the surface over his onto his foot. PULMONARY: There is good air exchange  ABDOMEN: Soft and non-tender  MUSCULOSKELETAL: There are no major deformities or cyanosis. NEUROLOGIC: No focal weakness or paresthesias are detected. SKIN: There are no ulcers or rashes noted. PSYCHIATRIC: The patient has a normal affect.  DATA:  Duplex shows reflux in his great saphenous vein on the right.  No DVT.  Does have reflux in the common femoral vein, femoral vein on the right as well.  MEDICAL ISSUES: Failed conservative treatment of venous hypertension.  I have recommended laser ablation of his saphenous vein for reduction of his venous hypertension.  Also explained that he may require stab phlebectomy and sclerotherapy for the tributary varicosities and extensive raised telangiectasia.  Explained that we would wait 3 months following the laser ablation of determine if this would be indicated.  He understands and wished to proceed as soon as possible.  He does have skin changes on his right leg associated with hemosiderin deposit and has had marked progression despite extremely compliant compression garments for years.  CEAP Class 4    Rosetta Posner, MD FACS [facility] Tel [phone] Pager [phone]

## 2017-07-21 ENCOUNTER — Encounter: Payer: Self-pay | Admitting: Vascular Surgery

## 2017-07-21 ENCOUNTER — Ambulatory Visit (INDEPENDENT_AMBULATORY_CARE_PROVIDER_SITE_OTHER): Payer: Medicare Other | Admitting: Vascular Surgery

## 2017-07-21 DIAGNOSIS — I83891 Varicose veins of right lower extremities with other complications: Secondary | ICD-10-CM | POA: Diagnosis not present

## 2017-07-21 NOTE — Progress Notes (Signed)
     Laser Ablation Procedure    Date: 07/21/2017   Austin Faulkner DOB:1956/01/29  Consent signed: Yes    Surgeon:  Dr. Tawanna Cooler Heavenleigh Petruzzi  Procedure: Laser Ablation: right Greater Saphenous Vein  There were no vitals taken for this visit.  Tumescent Anesthesia: 300 cc 0.9% NaCl with 50 cc Lidocaine HCL with 1% Epi and 15 cc 8.4% NaHCO3  Local Anesthesia: 2 cc Lidocaine HCL and NaHCO3 (ratio 2:1)  15 watts continuous mode        Total energy: 1330   Total time: 1:28    Patient tolerated procedure well  Notes:   Description of Procedure:  After marking the course of the secondary varicosities, the patient was placed on the operating table in the supine position, and the right leg was prepped and draped in sterile fashion.   Local anesthetic was administered and under ultrasound guidance the saphenous vein was accessed with a micro needle and guide wire; then the mirco puncture sheath was placed.  A guide wire was inserted saphenofemoral junction , followed by a 5 french sheath.  The position of the sheath and then the laser fiber below the junction was confirmed using the ultrasound.  Tumescent anesthesia was administered along the course of the saphenous vein using ultrasound guidance. The patient was placed in Trendelenburg position and protective laser glasses were placed on patient and staff, and the laser was fired at 15 watts continuous mode advancing 1-25mm/second for a total of 1330 joules.     Steri strips were applied to the stab wounds and ABD pads and thigh high compression stockings were applied.  Ace wrap bandages were applied over the phlebectomy sites and at the top of the saphenofemoral junction. Blood loss was less than 15 cc.  The patient ambulated out of the operating room having tolerated the procedure well.  Uneventful ablation from mid to distal thigh to just below the saphenofemoral junction.  His saphenous vein branched into multiple branches and mid to distal thigh  with no dominant saphenous vein below this point.  Had ablation from this point to just below the saphenofemoral junction.  Follow-up in 1 week.

## 2017-07-28 ENCOUNTER — Ambulatory Visit (HOSPITAL_COMMUNITY)
Admission: RE | Admit: 2017-07-28 | Discharge: 2017-07-28 | Disposition: A | Discharge status: Home / Self Care | Payer: Medicare Other | Source: Ambulatory Visit | Attending: Vascular Surgery | Admitting: Vascular Surgery

## 2017-07-28 ENCOUNTER — Encounter: Payer: Self-pay | Admitting: Vascular Surgery

## 2017-07-28 ENCOUNTER — Other Ambulatory Visit: Payer: Self-pay

## 2017-07-28 ENCOUNTER — Ambulatory Visit (INDEPENDENT_AMBULATORY_CARE_PROVIDER_SITE_OTHER): Payer: Medicare Other | Admitting: Vascular Surgery

## 2017-07-28 VITALS — BP 125/87 | HR 76 | Temp 99.0°F | Resp 18 | Ht 70.0 in | Wt 207.0 lb

## 2017-07-28 DIAGNOSIS — I83891 Varicose veins of right lower extremities with other complications: Secondary | ICD-10-CM

## 2017-07-28 DIAGNOSIS — I83811 Varicose veins of right lower extremities with pain: Secondary | ICD-10-CM | POA: Insufficient documentation

## 2017-07-28 NOTE — Progress Notes (Signed)
Vascular and Vein Specialist of [place]  Patient name: Austin Faulkner MRN: [id number] DOB: 29-Aug-1955 Sex: male  REASON FOR VISIT: One week follow-up laser ablation right great saphenous vein  HPI: Austin Faulkner is a 62 y.o. male here today for one-week follow-up.  He has had minimal discomfort and has been compliant with his compression and ibuprofen usage.  He had mild bruising associated with the procedure  Past Medical History:  Diagnosis Date  . Asthma   . COPD (chronic obstructive pulmonary disease) ([place])   . Depression   . Diverticulitis   . Dyspnea    secondary to seasonal allergies; or illness  . GERD (gastroesophageal reflux disease)   . Heart murmur    slight since birth  . Hypertension   . Pneumonia yrs ago  . Rheumatoid arthritis ([place])   . Seasonal allergies   . Shoulder dislocation     Family History  Problem Relation Age of Onset  . Lung cancer Mother   . Heart attack Father 26       "healthier than I am"  . Lupus Sister   . Stroke Paternal Grandmother   . Multiple myeloma Sister        BMT at [facility]: Social History   Tobacco Use  . Smoking status: Passive Smoke Exposure - Never Smoker  . Smokeless tobacco: Never Used  Substance Use Topics  . Alcohol use: No    No Known Allergies  Current Outpatient Medications  Medication Sig Dispense Refill  . acetaminophen (TYLENOL) 325 MG tablet Take 1-2 tablets (325-650 mg total) by mouth every 6 (six) hours as needed for fever, headache, mild pain or moderate pain.    Marland Kitchen albuterol (PROVENTIL HFA) 108 (90 BASE) MCG/ACT inhaler Inhale 2 puffs into the lungs every 6 (six) hours as needed for wheezing or shortness of breath.     . Ascorbic Acid (VITAMIN C) 1000 MG tablet Take 1,000 mg by mouth daily.    . budesonide-formoterol (SYMBICORT) 160-4.5 MCG/ACT inhaler Inhale 2 puffs into the lungs 2 (two) times daily.     . COD LIVER OIL PO Take 5 mLs by  mouth daily.    . Flaxseed, Linseed, (FLAX SEED OIL PO) Take 5 mLs by mouth 2 (two) times daily.    . fluticasone (FLONASE) 50 MCG/ACT nasal spray Place 1-2 sprays into both nostrils daily as needed for allergies or rhinitis.    . hydrochlorothiazide (HYDRODIURIL) 25 MG tablet Take 25 mg by mouth daily.    . hydroxychloroquine (PLAQUENIL) 200 MG tablet Take 200 mg by mouth 2 (two) times daily.    Marland Kitchen ibuprofen (ADVIL,MOTRIN) 800 MG tablet Take 800 mg by mouth daily.    Marland Kitchen loratadine (CLARITIN) 10 MG tablet Take 10 mg by mouth daily as needed for allergies.     . magnesium gluconate (MAGONATE) 500 MG tablet Take 500 mg by mouth daily.    . montelukast (SINGULAIR) 10 MG tablet Take 10 mg by mouth at bedtime as needed (allergies).     . Multiple Vitamin (MULTIVITAMIN WITH MINERALS) TABS tablet Take 1 tablet by mouth daily.    Mable Fill (NAPHCON-A OP) Apply 1 drop to eye daily as needed (allergies).    Marland Kitchen omeprazole (PRILOSEC) 20 MG capsule Take 20 mg by mouth daily.     Marland Kitchen PARoxetine (PAXIL) 20 MG tablet Take 20 mg by mouth daily.    . tadalafil (CIALIS) 20 MG tablet     .  tamsulosin (FLOMAX) 0.4 MG CAPS capsule Take 0.4 mg by mouth.    . traMADol (ULTRAM) 50 MG tablet Take 1 tablet (50 mg total) by mouth every 6 (six) hours as needed. 15 tablet 0  . traZODone (DESYREL) 100 MG tablet Take 100 mg by mouth at bedtime.    . TURMERIC CURCUMIN PO Take 900 mg by mouth daily.     Marland Kitchen oxyCODONE (OXY IR/ROXICODONE) 5 MG immediate release tablet Take 1 tablet (5 mg total) by mouth every 4 (four) hours as needed for moderate pain or severe pain. (Patient not taking: Reported on 07/28/2017) 20 tablet 0   No current facility-administered medications for this visit.     REVIEW OF SYSTEMS:  [X]  denotes positive finding, [ ]  denotes negative finding Cardiac  Comments:  Chest pain or chest pressure:    Shortness of breath upon exertion:    Short of breath when lying flat:    Irregular heart rhythm:         Vascular    Pain in calf, thigh, or hip brought on by ambulation:    Pain in feet at night that wakes you up from your sleep:     Blood clot in your veins:    Leg swelling:  x         PHYSICAL EXAM: Vitals:   07/28/17 0947  BP: 125/87  Pulse: 76  Resp: 18  Temp: 99 F (37.2 C)  TempSrc: Oral  SpO2: 98%  Weight: 207 lb (93.9 kg)  Height: 5' 10"  (1.778 m)    GENERAL: The patient is a well-nourished male, in no acute distress. The vital signs are documented above. CARDIOVASCULAR: Mild bruising on the medial thigh.  No skin irritation from the ablation PULMONARY: There is good air exchange  MUSCULOSKELETAL: There are no major deformities or cyanosis. NEUROLOGIC: No focal weakness or paresthesias are detected. SKIN: There are no ulcers or rashes noted. PSYCHIATRIC: The patient has a normal affect.  DATA:  Duplex shows closure of his great saphenous vein from the distal entry site to 4 mm from the saphenofemoral junction and no DVT  MEDICAL ISSUES: Stable follow-up.  Will be seen again in 3 months.  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  May require additional treatment and we will discuss this at his 9-monthvisit    TRosetta Posner MD FBrownsville Doctors HospitalVascular and Vein Specialists of GD[facility] LPTel [phone] [phone]

## 2017-11-01 ENCOUNTER — Encounter: Payer: Self-pay | Admitting: Vascular Surgery

## 2017-11-01 ENCOUNTER — Ambulatory Visit (INDEPENDENT_AMBULATORY_CARE_PROVIDER_SITE_OTHER): Payer: No Typology Code available for payment source | Admitting: Vascular Surgery

## 2017-11-01 VITALS — BP 137/83 | HR 75 | Temp 98.0°F | Resp 16 | Ht 70.0 in | Wt 213.0 lb

## 2017-11-01 DIAGNOSIS — I83891 Varicose veins of right lower extremities with other complications: Secondary | ICD-10-CM

## 2017-11-01 NOTE — Progress Notes (Signed)
Vascular and Vein Specialist of [place]  Patient name: Austin Faulkner MRN: [id number] DOB: 15-Sep-1955 Sex: male  REASON FOR VISIT: Low up right leg venous varicosities.  HPI: Austin Faulkner is a 62 y.o. male here today for follow-up.  He had undergone ablation of his great saphenous vein in February 2019.  He has been compliant with his compression.  He continues to have a mass of varicosities in his medial calf and ankle.  Also some varicosities in his medial right thigh.  These are somewhat decompressed since his initial procedure but are still causing him discomfort  Past Medical History:  Diagnosis Date  . Asthma   . COPD (chronic obstructive pulmonary disease) ([place])   . Depression   . Diverticulitis   . Dyspnea    secondary to seasonal allergies; or illness  . GERD (gastroesophageal reflux disease)   . Heart murmur    slight since birth  . Hypertension   . Pneumonia yrs ago  . Rheumatoid arthritis ([place])   . Seasonal allergies   . Shoulder dislocation     Family History  Problem Relation Age of Onset  . Lung cancer Mother   . Heart attack Father 37       "healthier than I am"  . Lupus Sister   . Stroke Paternal Grandmother   . Multiple myeloma Sister        BMT at [place]: Social History   Tobacco Use  . Smoking status: Passive Smoke Exposure - Never Smoker  . Smokeless tobacco: Never Used  Substance Use Topics  . Alcohol use: No    No Known Allergies  Current Outpatient Medications  Medication Sig Dispense Refill  . acetaminophen (TYLENOL) 325 MG tablet Take 1-2 tablets (325-650 mg total) by mouth every 6 (six) hours as needed for fever, headache, mild pain or moderate pain.    Marland Kitchen albuterol (PROVENTIL HFA) 108 (90 BASE) MCG/ACT inhaler Inhale 2 puffs into the lungs every 6 (six) hours as needed for wheezing or shortness of breath.     . Ascorbic Acid (VITAMIN C) 1000 MG tablet Take 1,000 mg by mouth  daily.    . budesonide-formoterol (SYMBICORT) 160-4.5 MCG/ACT inhaler Inhale 2 puffs into the lungs 2 (two) times daily.     . COD LIVER OIL PO Take 5 mLs by mouth daily.    . Flaxseed, Linseed, (FLAX SEED OIL PO) Take 5 mLs by mouth 2 (two) times daily.    . fluticasone (FLONASE) 50 MCG/ACT nasal spray Place 1-2 sprays into both nostrils daily as needed for allergies or rhinitis.    . hydrochlorothiazide (HYDRODIURIL) 25 MG tablet Take 25 mg by mouth daily.    . hydroxychloroquine (PLAQUENIL) 200 MG tablet Take 200 mg by mouth 2 (two) times daily.    Marland Kitchen ibuprofen (ADVIL,MOTRIN) 800 MG tablet Take 800 mg by mouth daily.    Marland Kitchen loratadine (CLARITIN) 10 MG tablet Take 10 mg by mouth daily as needed for allergies.     . magnesium gluconate (MAGONATE) 500 MG tablet Take 500 mg by mouth daily.    . montelukast (SINGULAIR) 10 MG tablet Take 10 mg by mouth at bedtime as needed (allergies).     . Multiple Vitamin (MULTIVITAMIN WITH MINERALS) TABS tablet Take 1 tablet by mouth daily.    Mable Fill (NAPHCON-A OP) Apply 1 drop to eye daily as needed (allergies).    Marland Kitchen omeprazole (PRILOSEC) 20 MG capsule Take 20 mg by  mouth daily.     Marland Kitchen oxyCODONE (OXY IR/ROXICODONE) 5 MG immediate release tablet Take 1 tablet (5 mg total) by mouth every 4 (four) hours as needed for moderate pain or severe pain. 20 tablet 0  . PARoxetine (PAXIL) 20 MG tablet Take 20 mg by mouth daily.    Marland Kitchen sulfaSALAzine (AZULFIDINE) 500 MG tablet Take 500 mg by mouth 2 (two) times daily.    . tadalafil (CIALIS) 20 MG tablet     . tamsulosin (FLOMAX) 0.4 MG CAPS capsule Take 0.4 mg by mouth.    . traMADol (ULTRAM) 50 MG tablet Take 1 tablet (50 mg total) by mouth every 6 (six) hours as needed. 15 tablet 0  . traZODone (DESYREL) 100 MG tablet Take 100 mg by mouth at bedtime.    . TURMERIC CURCUMIN PO Take 900 mg by mouth daily.      No current facility-administered medications for this visit.     REVIEW OF SYSTEMS:  _0   denotes positive finding, _1  denotes negative finding Cardiac  Comments:  Chest pain or chest pressure:    Shortness of breath upon exertion:    Short of breath when lying flat:    Irregular heart rhythm:        Vascular    Pain in calf, thigh, or hip brought on by ambulation:    Pain in feet at night that wakes you up from your sleep:     Blood clot in your veins:    Leg swelling:  x         PHYSICAL EXAM: Vitals:   11/01/17 1009  BP: 137/83  Pulse: 75  Resp: 16  Temp: 98 F (36.7 C)  SpO2: 98%  Weight: 213 lb (96.6 kg)  Height: _2  (1.778 m)    GENERAL: The patient is a well-nourished male, in no acute distress. The vital signs are documented above. CARDIOVASCULAR: Varicosities in the medial calf medial thigh and extensive telangiectasias around his ankle PULMONARY: There is good air exchange  MUSCULOSKELETAL: There are no major deformities or cyanosis. NEUROLOGIC: No focal weakness or paresthesias are detected. SKIN: There are no ulcers or rashes noted. PSYCHIATRIC: The patient has a normal affect.  DATA:  None  MEDICAL ISSUES: Improvement but persistent pain associated with varicosities.  Have recommended stab phlebectomy, 10-20 of his remaining tributary varicosities and sclerotherapy of the painful telangiectasia around his foot and ankle.  We will schedule this as an outpatient at his earliest [place]. Dexter Signor, MD [facility] Vascular and Vein Specialists of [facility] Tel [phone] Pager [phone]

## 2017-12-15 ENCOUNTER — Encounter: Payer: Self-pay | Admitting: Vascular Surgery

## 2017-12-15 ENCOUNTER — Ambulatory Visit (INDEPENDENT_AMBULATORY_CARE_PROVIDER_SITE_OTHER): Payer: Medicare Other | Admitting: Vascular Surgery

## 2017-12-15 DIAGNOSIS — I83891 Varicose veins of right lower extremities with other complications: Secondary | ICD-10-CM | POA: Diagnosis not present

## 2017-12-15 NOTE — Progress Notes (Signed)
    Stab Phlebectomy Procedure  Austin Faulkner DOB:May 07, 1956  12/15/2017  Consent signed: Yes  Surgeon:T.F. Jaxin Fulfer, MD  Procedure: stab phlebectomy: right leg  There were no vitals taken for this visit.  Start time: 10:40   End time: 12:30   Tumescent Anesthesia: 275 cc 0.9% NaCl with 50 cc Lidocaine HCL with 1% Epi and 15 cc 8.4% NaHCO3  Local Anesthesia: 6 cc Lidocaine HCL and NaHCO3 (ratio 2:1)  Sclerotherapy: >3 %Sotradecol. Patient received a total of 18 cc  Stab Phlebectomy: 10-20 Sites: Thigh and Calf  Patient tolerated procedure well: Yes  Notes:   Description of Procedure:  After marking the course of the secondary varicosities, the patient was placed on the operating table in the supine position, and the right leg was prepped and draped in sterile fashion.    The patient was then put into Trendelenburg position.  Local anesthetic was administered at the previously marked varicosities, and tumescent anesthesia was administered around the vessels.  Ten to 20 stab wounds were made using the tip of an 11 blade. And using the vein hook, the phlebectomies were performed using a hemostat to avulse the varicosities.  Adequate hemostasis was achieved, and steri strips were applied to the stab wound.    Sclerotherapy was performed to varicosities using 18  cc .3% Sotradecol foam via a 27g butterfly needle.  ABD pads and thigh high compression stockings were applied as well ace wraps where needed. Blood loss was less than 15 cc.  The patient ambulated out of the operating room having tolerated the procedure well.

## 2018-01-03 ENCOUNTER — Ambulatory Visit: Payer: No Typology Code available for payment source | Admitting: Vascular Surgery

## 2018-06-27 DIAGNOSIS — I10 Essential (primary) hypertension: Secondary | ICD-10-CM | POA: Diagnosis not present

## 2018-06-27 DIAGNOSIS — Z7722 Contact with and (suspected) exposure to environmental tobacco smoke (acute) (chronic): Secondary | ICD-10-CM | POA: Diagnosis not present

## 2018-06-27 DIAGNOSIS — M069 Rheumatoid arthritis, unspecified: Secondary | ICD-10-CM | POA: Diagnosis not present

## 2018-06-27 DIAGNOSIS — Z79899 Other long term (current) drug therapy: Secondary | ICD-10-CM | POA: Diagnosis not present

## 2018-06-27 DIAGNOSIS — R0602 Shortness of breath: Secondary | ICD-10-CM | POA: Diagnosis present

## 2018-06-27 DIAGNOSIS — J441 Chronic obstructive pulmonary disease with (acute) exacerbation: Secondary | ICD-10-CM | POA: Diagnosis not present

## 2018-06-28 ENCOUNTER — Emergency Department (HOSPITAL_BASED_OUTPATIENT_CLINIC_OR_DEPARTMENT_OTHER)
Admission: EM | Admit: 2018-06-28 | Discharge: 2018-06-28 | Disposition: A | Discharge status: Home / Self Care | Payer: Medicare Other | Attending: Emergency Medicine | Admitting: Emergency Medicine

## 2018-06-28 ENCOUNTER — Encounter (HOSPITAL_BASED_OUTPATIENT_CLINIC_OR_DEPARTMENT_OTHER): Payer: Self-pay | Admitting: Emergency Medicine

## 2018-06-28 ENCOUNTER — Emergency Department (HOSPITAL_BASED_OUTPATIENT_CLINIC_OR_DEPARTMENT_OTHER): Payer: Medicare Other

## 2018-06-28 ENCOUNTER — Other Ambulatory Visit: Payer: Self-pay

## 2018-06-28 DIAGNOSIS — J441 Chronic obstructive pulmonary disease with (acute) exacerbation: Secondary | ICD-10-CM

## 2018-06-28 MED ORDER — IPRATROPIUM-ALBUTEROL 0.5-2.5 (3) MG/3ML IN SOLN: 3.0000 mL | Freq: Once | RESPIRATORY_TRACT | Status: DC

## 2018-06-28 MED ORDER — IPRATROPIUM-ALBUTEROL 0.5-2.5 (3) MG/3ML IN SOLN
RESPIRATORY_TRACT | Status: AC
  Filled 2018-06-28: qty 3

## 2018-06-28 NOTE — ED Notes (Signed)
Pt states he has had difficulty breathing all day  States since he had a treatment he feels better and is ready to go

## 2018-06-28 NOTE — ED Provider Notes (Signed)
TIME SEEN: 1:15 AM  CHIEF COMPLAINT: Shortness of breath  HPI: Patient is a 63 year old male with history of asthma and COPD, hypertension, rheumatoid arthritis on immunosuppressants who presents to the emergency department shortness of breath and wheezing that started tonight.  States he is being treated for pneumonia with cefuroxime and methylprednisone.  Has inhalers at home that were not helping his symptoms.  Given breathing treatment in triage and states he is feeling much better and is completely asymptomatic now.  No current use of breath.  He did not have any chest pain or fever.  No productive cough.  ROS: See HPI Constitutional: no fever  Eyes: no drainage  ENT: no runny nose   Cardiovascular:  no chest pain  Resp: SOB  GI: no vomiting GU: no dysuria Integumentary: no rash  Allergy: no hives  Musculoskeletal: no leg swelling  Neurological: no slurred speech ROS otherwise negative  PAST MEDICAL HISTORY/PAST SURGICAL HISTORY:  Past Medical History:  Diagnosis Date  . Asthma   . COPD (chronic obstructive pulmonary disease) ([place])   . Depression   . Diverticulitis   . Dyspnea    secondary to seasonal allergies; or illness  . GERD (gastroesophageal reflux disease)   . Heart murmur    slight since birth  . Hypertension   . Pneumonia yrs ago  . Rheumatoid arthritis ([place])   . Seasonal allergies   . Shoulder dislocation     MEDICATIONS:  Prior to Admission medications   Medication Sig Start Date End Date Taking? Authorizing Provider  acetaminophen (TYLENOL) 325 MG tablet Take 1-2 tablets (325-650 mg total) by mouth every 6 (six) hours as needed for fever, headache, mild pain or moderate pain. 03/08/16   Nita Sells, MD  albuterol (PROVENTIL HFA) 108 (90 BASE) MCG/ACT inhaler Inhale 2 puffs into the lungs every 6 (six) hours as needed for wheezing or shortness of breath.     [provider]  Ascorbic Acid (VITAMIN C) 1000 MG tablet Take 1,000 mg by mouth  daily.    [provider]  budesonide-formoterol (SYMBICORT) 160-4.5 MCG/ACT inhaler Inhale 2 puffs into the lungs 2 (two) times daily.     [provider]  COD LIVER OIL PO Take 5 mLs by mouth daily.    [provider]  Flaxseed, Linseed, (FLAX SEED OIL PO) Take 5 mLs by mouth 2 (two) times daily.    [provider]  fluticasone (FLONASE) 50 MCG/ACT nasal spray Place 1-2 sprays into both nostrils daily as needed for allergies or rhinitis.    [provider]  hydrochlorothiazide (HYDRODIURIL) 25 MG tablet Take 25 mg by mouth daily.    [provider]  hydroxychloroquine (PLAQUENIL) 200 MG tablet Take 200 mg by mouth 2 (two) times daily.    [provider]  ibuprofen (ADVIL,MOTRIN) 800 MG tablet Take 800 mg by mouth daily.    [provider]  loratadine (CLARITIN) 10 MG tablet Take 10 mg by mouth daily as needed for allergies.     [provider]  magnesium gluconate (MAGONATE) 500 MG tablet Take 500 mg by mouth daily.    [provider]  montelukast (SINGULAIR) 10 MG tablet Take 10 mg by mouth at bedtime as needed (allergies).     [provider]  Multiple Vitamin (MULTIVITAMIN WITH MINERALS) TABS tablet Take 1 tablet by mouth daily.    [provider]  Naphazoline-Pheniramine (NAPHCON-A OP) Apply 1 drop to eye daily as needed (allergies).    [provider]  omeprazole (PRILOSEC) 20 MG capsule Take 20 mg by mouth daily.     [provider]  oxyCODONE (OXY IR/ROXICODONE) 5 MG immediate release tablet Take 1 tablet (5 mg total) by mouth every 4 (four) hours as needed for moderate pain or severe pain. 4/0/81   Leighton Ruff, MD  PARoxetine (PAXIL) 20 MG tablet Take 20 mg by mouth daily.    [provider]  sulfaSALAzine (AZULFIDINE) 500 MG tablet Take 500 mg by mouth 2 (two) times daily.    [provider]  tadalafil (CIALIS) 20 MG tablet  05/04/17    [provider]  tamsulosin (FLOMAX) 0.4 MG CAPS capsule Take 0.4 mg by mouth.    [provider]  traMADol (ULTRAM) 50 MG tablet Take 1 tablet (50 mg total) by mouth every 6 (six) hours as needed. 03/05/17   Margarita Mail, PA-C  traZODone (DESYREL) 100 MG tablet Take 100 mg by mouth at bedtime.    [provider]  TURMERIC CURCUMIN PO Take 900 mg by mouth daily.     [provider]    ALLERGIES:  No Known Allergies  SOCIAL HISTORY:  Social History   Tobacco Use  . Smoking status: Passive Smoke Exposure - Never Smoker  . Smokeless tobacco: Never Used  Substance Use Topics  . Alcohol use: No    FAMILY HISTORY: Family History  Problem Relation Age of Onset  . Lung cancer Mother   . Heart attack Father 73       "healthier than I am"  . Lupus Sister   . Stroke Paternal Grandmother   . Multiple myeloma Sister        BMT at [facility]    EXAM: BP (!) 146/91 (BP Location: Right Arm)   Pulse 84   Temp 98.1 F (36.7 C) (Oral)   Resp 19   Ht 5' 10" (1.778 m)   Wt 95.3 kg   SpO2 95%   BMI 30.13 kg/m  CONSTITUTIONAL: Alert and oriented and responds appropriately to questions. Well-appearing; well-nourished HEAD: Normocephalic EYES: Conjunctivae clear, pupils appear equal, EOMI ENT: normal nose; moist mucous membranes NECK: Supple, no meningismus, no nuchal rigidity, no LAD  CARD: RRR; S1 and S2 appreciated; no murmurs, no clicks, no rubs, no gallops RESP: Normal chest excursion without splinting or tachypnea; breath sounds clear and equal bilaterally; no wheezes, no rhonchi, no rales, no hypoxia or respiratory distress, speaking full sentences ABD/GI: Normal bowel sounds; non-distended; soft, non-tender, no rebound, no guarding, no peritoneal signs, no hepatosplenomegaly BACK:  The back appears normal and is non-tender to palpation, there is no CVA tenderness EXT: Normal ROM in all joints; non-tender to palpation; no edema; normal capillary  refill; no cyanosis, no calf tenderness or swelling    SKIN: Normal color for age and race; warm; no rash NEURO: Moves all extremities equally PSYCH: The patient's mood and manner are appropriate. Grooming and personal hygiene are appropriate.  MEDICAL DECISION MAKING: Patient here with mild asthma/COPD exacerbation.  He is already on oral steroids.  Has inhalers at home.  Lungs now completely clear and he is now asymptomatic after breathing treatment.  Chest x-ray obtained in triage shows no pneumonia, pneumothorax or edema.  Doubt ACS, PE or dissection.  Have advised him to continue his medications as prescribed.  I feel he is safe for discharge home.  Patient verbalized understanding.  At this time, I do not feel there is any life-threatening condition present. I have reviewed and  discussed all results (EKG, imaging, lab, urine as appropriate) and exam findings with patient/family. I have reviewed nursing notes and appropriate previous records.  I feel the patient is safe to be discharged home without further emergent workup and can continue workup as an outpatient as needed. Discussed usual and customary return precautions. Patient/family verbalize understanding and are comfortable with this plan.  Outpatient follow-up has been provided as needed. All questions have been answered.      Blayde Bacigalupi, Delice Bison, DO 06/28/18 [phone]

## 2018-06-28 NOTE — ED Triage Notes (Signed)
Pt states he is been having some SOB and wheezing for the past few days, states he had PNA x 3 since November.

## 2018-06-28 NOTE — Discharge Instructions (Addendum)
Please continue to take your antibiotics and steroids as prescribed.  Please use your inhalers as prescribed.  Your chest x-ray today was clear.

## 2019-04-16 LAB — HM COLONOSCOPY

## 2019-04-23 ENCOUNTER — Other Ambulatory Visit: Payer: Self-pay

## 2019-04-24 ENCOUNTER — Ambulatory Visit (INDEPENDENT_AMBULATORY_CARE_PROVIDER_SITE_OTHER): Payer: Medicare Other | Admitting: Family

## 2019-04-24 ENCOUNTER — Encounter: Payer: Self-pay | Admitting: Family

## 2019-04-24 ENCOUNTER — Other Ambulatory Visit: Payer: Self-pay

## 2019-04-24 VITALS — BP 120/84 | HR 88 | Temp 96.1°F | Resp 16 | Ht 70.0 in | Wt 211.0 lb

## 2019-04-24 DIAGNOSIS — I10 Essential (primary) hypertension: Secondary | ICD-10-CM

## 2019-04-24 DIAGNOSIS — J45909 Unspecified asthma, uncomplicated: Secondary | ICD-10-CM

## 2019-04-24 DIAGNOSIS — M069 Rheumatoid arthritis, unspecified: Secondary | ICD-10-CM

## 2019-04-24 DIAGNOSIS — F329 Major depressive disorder, single episode, unspecified: Secondary | ICD-10-CM

## 2019-04-24 DIAGNOSIS — D649 Anemia, unspecified: Secondary | ICD-10-CM

## 2019-04-24 DIAGNOSIS — M549 Dorsalgia, unspecified: Secondary | ICD-10-CM

## 2019-04-24 DIAGNOSIS — G8929 Other chronic pain: Secondary | ICD-10-CM

## 2019-04-24 DIAGNOSIS — F32A Depression, unspecified: Secondary | ICD-10-CM

## 2019-04-24 DIAGNOSIS — N400 Enlarged prostate without lower urinary tract symptoms: Secondary | ICD-10-CM

## 2019-04-24 DIAGNOSIS — K5790 Diverticulosis of intestine, part unspecified, without perforation or abscess without bleeding: Secondary | ICD-10-CM

## 2019-04-24 DIAGNOSIS — J449 Chronic obstructive pulmonary disease, unspecified: Secondary | ICD-10-CM

## 2019-04-24 LAB — COMPREHENSIVE METABOLIC PANEL
ALT: 25 U/L (ref 0–53)
AST: 18 U/L (ref 0–37)
Albumin: 4.2 g/dL (ref 3.5–5.2)
Alkaline Phosphatase: 58 U/L (ref 39–117)
BUN: 16 mg/dL (ref 6–23)
CO2: 30 mEq/L (ref 19–32)
Calcium: 9.3 mg/dL (ref 8.4–10.5)
Chloride: 101 mEq/L (ref 96–112)
Creatinine, Ser: 1.05 mg/dL (ref 0.40–1.50)
GFR: 86.87 mL/min (ref >60.00)
Glucose, Bld: 89 mg/dL (ref 70–99)
Potassium: 4.2 mEq/L (ref 3.5–5.1)
Sodium: 139 mEq/L (ref 135–145)
Total Bilirubin: 0.6 mg/dL (ref 0.2–1.2)
Total Protein: 6.7 g/dL (ref 6.0–8.3)

## 2019-04-24 LAB — CBC WITH DIFFERENTIAL/PLATELET
Basophils Absolute: 0.1 K/uL (ref 0.0–0.1)
Basophils Relative: 0.7 % (ref 0.0–3.0)
Eosinophils Absolute: 0 K/uL (ref 0.0–0.7)
Eosinophils Relative: 0 % (ref 0.0–5.0)
HCT: 40.7 % (ref 39.0–52.0)
Hemoglobin: 13.5 g/dL (ref 13.0–17.0)
Lymphocytes Relative: 31.5 % (ref 12.0–46.0)
Lymphs Abs: 2.2 K/uL (ref 0.7–4.0)
MCHC: 33.2 g/dL (ref 30.0–36.0)
MCV: 93.4 fl (ref 78.0–100.0)
Monocytes Absolute: 0.7 K/uL (ref 0.1–1.0)
Monocytes Relative: 10 % (ref 3.0–12.0)
Neutro Abs: 4.1 K/uL (ref 1.4–7.7)
Neutrophils Relative %: 57.8 % (ref 43.0–77.0)
Platelets: 327 K/uL (ref 150.0–400.0)
RBC: 4.36 Mil/uL (ref 4.22–5.81)
RDW: 13.8 % (ref 11.5–15.5)
WBC: 7.1 K/uL (ref 4.0–10.5)

## 2019-04-24 NOTE — Progress Notes (Signed)
Subjective:    Patient ID: Austin Faulkner, male    DOB: [birth year], 63 y.o.   MRN: [id number]  HPI  Patient is a 63 yr old male who presents today to re-establish care.  We last saw him in 2013.    Reports that he had a normal stress test at the [place].  Reports colo and EGD 3 weeks ago. Working with Dr. Earlean Shawl for GERD symptoms.    HTN- maintained on hctz BP Readings from Last 3 Encounters:  04/24/19 120/84  06/28/18 (!) 146/91  11/01/17 137/83   RA- taking tumeric, MVI, seeing rheumatologist at [facility]. Does have some chronic pain from this.   Depression- maintained on paxil. Reports that his depression is well controlled.  Has a psychiatrist at the [place]. Sees psychology as well re: PTSD.  COPD/Asthma- reports symptoms are stable.  Recently started symbicort.    Chronic back pain- sees Dr. Sherwood Gambler who is giving him L4/L5 ESI's which help his pain.    BPH- maintained on flomax.  Diverticulitis- had a flare back in 2017 with surgery.     Review of Systems    see HPI  Past Medical History:  Diagnosis Date  . Asthma   . COPD (chronic obstructive pulmonary disease) ([place])   . Depression   . Diverticulitis   . Dyspnea    secondary to seasonal allergies; or illness  . GERD (gastroesophageal reflux disease)   . Heart murmur    slight since birth  . Hypertension   . Pneumonia yrs ago  . Rheumatoid arthritis ([place])   . Seasonal allergies   . Shoulder dislocation      Social History   Socioeconomic History  . Marital status: Married    Spouse name: Silva Bandy  . Number of children: 3  . Years of education: Not on file  . Highest education level: Not on file  Occupational History  . Occupation: retired  Scientific laboratory technician  . Financial resource strain: Not hard at all  . Food insecurity    Worry: Never true    Inability: Never true  . Transportation needs    Medical: No    Non-medical: No  Tobacco Use  . Smoking status: Passive Smoke Exposure - Never Smoker  . Smokeless  tobacco: Never Used  Substance and Sexual Activity  . Alcohol use: No  . Drug use: No  . Sexual activity: Yes    Partners: Female  Lifestyle  . Physical activity    Days per week: 0 days    Minutes per session: Not on file  . Stress: Not on file  Relationships  . Social connections    Talks on phone: More than three times a week    Gets together: Never    Attends religious service: More than 4 times per year    Active member of club or organization: No    Attends meetings of clubs or organizations: Never    Relationship status: Married  . Intimate partner violence    Fear of current or ex partner: No    Emotionally abused: No    Physically abused: No    Forced sexual activity: No  Other Topics Concern  . Not on file  Social History Narrative  . Not on file    Past Surgical History:  Procedure Laterality Date  . knee arthroscopy and cartlidge repair Bilateral    5 on right and 5 on left  . SHOULDER ARTHROSCOPY Left 2008  . SMALL INTESTINE SURGERY  2017   small intestine resection  . TOTAL KNEE ARTHROPLASTY Bilateral     Family History  Problem Relation Age of Onset  . Lung cancer Mother   . Heart attack Father 16       "healthier than I am"  . Lupus Sister   . Stroke Paternal Grandmother   . Multiple myeloma Sister        BMT at [facility]    Allergies  Allergen Reactions  . Azithromycin     Current Outpatient Medications on File Prior to Visit  Medication Sig Dispense Refill  . acetaminophen (TYLENOL) 325 MG tablet Take 1-2 tablets (325-650 mg total) by mouth every 6 (six) hours as needed for fever, headache, mild pain or moderate pain.    Marland Kitchen albuterol (PROVENTIL HFA) 108 (90 BASE) MCG/ACT inhaler Inhale 2 puffs into the lungs every 6 (six) hours as needed for wheezing or shortness of breath.     . Ascorbic Acid (VITAMIN C) 1000 MG tablet Take 1,000 mg by mouth daily.    . budesonide-formoterol (SYMBICORT) 160-4.5 MCG/ACT inhaler Inhale 2 puffs into the lungs 2  (two) times daily.     . COD LIVER OIL PO Take 5 mLs by mouth daily.    . Flaxseed, Linseed, (FLAX SEED OIL PO) Take 5 mLs by mouth 2 (two) times daily.    . fluticasone (FLONASE) 50 MCG/ACT nasal spray Place 1-2 sprays into both nostrils daily as needed for allergies or rhinitis.    . hydrochlorothiazide (HYDRODIURIL) 25 MG tablet Take 25 mg by mouth daily.    . hydroxychloroquine (PLAQUENIL) 200 MG tablet Take 200 mg by mouth 2 (two) times daily.    Marland Kitchen ibuprofen (ADVIL,MOTRIN) 800 MG tablet Take 800 mg by mouth daily.    . Multiple Vitamin (MULTIVITAMIN WITH MINERALS) TABS tablet Take 1 tablet by mouth daily.    Mable Fill (NAPHCON-A OP) Apply 1 drop to eye daily as needed (allergies).    Marland Kitchen omeprazole (PRILOSEC) 20 MG capsule Take 20 mg by mouth daily.     Marland Kitchen PARoxetine (PAXIL) 20 MG tablet Take 20 mg by mouth daily.    Marland Kitchen sulfaSALAzine (AZULFIDINE) 500 MG tablet Take 500 mg by mouth 2 (two) times daily.    . tadalafil (CIALIS) 20 MG tablet     . tamsulosin (FLOMAX) 0.4 MG CAPS capsule Take 0.4 mg by mouth.    . traZODone (DESYREL) 100 MG tablet Take 100 mg by mouth at bedtime.    . TURMERIC CURCUMIN PO Take 900 mg by mouth daily.      No current facility-administered medications on file prior to visit.     BP 120/84 (BP Location: Right Arm, Patient Position: Sitting, Cuff Size: Large)   Pulse 88   Temp (!) 96.1 F (35.6 C) (Temporal)   Resp 16   Ht 5' 10"  (1.778 m)   Wt 211 lb (95.7 kg)   SpO2 99%   BMI 30.28 kg/m    Objective:   Physical Exam Constitutional:      General: He is not in acute distress.    Appearance: He is well-developed.  HENT:     Head: Normocephalic and atraumatic.  Cardiovascular:     Rate and Rhythm: Normal rate and regular rhythm.     Heart sounds: No murmur.  Pulmonary:     Effort: Pulmonary effort is normal. No respiratory distress.     Breath sounds: Normal breath sounds. No wheezing or rales.  Skin:    General:  Skin is warm and  dry.  Neurological:     Mental Status: He is alert and oriented to person, place, and time.  Psychiatric:        Behavior: Behavior normal.        Thought Content: Thought content normal.           Assessment & Plan:  HTN- bp stable, continue current regimen.  RA- stable, management per rheumatology.  Depression- stable on paxil, followed by psychiatry and psychology at the [place].  BPH- stable on flomax, continue same.   COPD/asthma- stable now on symbicort.  Continue same.  Chronic back pain- stable and managed by neurosurgeon with ESI's.  History of diverticulitis- stable. No current symptoms. Monitor.    A total of 45  minutes were spent face-to-face with the patient during this encounter and over half of that time was spent on counseling and coordination of care. The patient was counseled on medication management, and treatment of above conditions.  This visit occurred during the SARS-CoV-2 public health emergency.  Safety protocols were in place, including screening questions prior to the visit, additional usage of staff PPE, and extensive cleaning of exam room while observing appropriate contact time as indicated for disinfecting solutions.

## 2019-04-24 NOTE — Patient Instructions (Signed)
Please complete lab work prior to leaving.  Welcome back!

## 2019-04-25 ENCOUNTER — Encounter: Payer: Self-pay | Admitting: Family

## 2019-04-25 MED ORDER — FAMOTIDINE 20 MG PO TABS: 20.0000 mg | ORAL_TABLET | Freq: Two times a day (BID) | ORAL | Status: DC

## 2019-04-25 MED ORDER — MELOXICAM 15 MG PO TABS: 15.0000 mg | ORAL_TABLET | Freq: Every day | ORAL | 0 refills | Status: DC

## 2019-04-25 MED ORDER — IPRATROPIUM-ALBUTEROL 0.5-2.5 (3) MG/3ML IN SOLN: 3.0000 mL | Freq: Four times a day (QID) | RESPIRATORY_TRACT | Status: AC | PRN

## 2019-04-25 MED ORDER — SPIRIVA RESPIMAT 1.25 MCG/ACT IN AERS: 2.0000 | INHALATION_SPRAY | Freq: Two times a day (BID) | RESPIRATORY_TRACT | Status: AC

## 2019-04-25 MED ORDER — CETIRIZINE HCL 10 MG PO TABS: 10.0000 mg | ORAL_TABLET | Freq: Every day | ORAL | 11 refills | Status: DC

## 2019-04-26 ENCOUNTER — Encounter: Payer: Self-pay | Admitting: Family

## 2019-04-26 DIAGNOSIS — F32A Depression, unspecified: Secondary | ICD-10-CM | POA: Insufficient documentation

## 2019-04-26 DIAGNOSIS — N400 Enlarged prostate without lower urinary tract symptoms: Secondary | ICD-10-CM | POA: Insufficient documentation

## 2019-04-26 DIAGNOSIS — F329 Major depressive disorder, single episode, unspecified: Secondary | ICD-10-CM | POA: Insufficient documentation

## 2019-04-26 DIAGNOSIS — J449 Chronic obstructive pulmonary disease, unspecified: Secondary | ICD-10-CM | POA: Insufficient documentation

## 2019-06-04 ENCOUNTER — Telehealth: Payer: Self-pay | Admitting: Family

## 2019-06-04 NOTE — Telephone Encounter (Signed)
Copied from CRM [id number]. Topic: General - Inquiry >> Jun 04, 2019  2:13 PM Daphine Deutscher D wrote: Reason for CRM: Pt called saying he needs medical clearance Feb 2nd for total rt hip replacement.  He has the paperwork he just needs Ms. Peggyann Juba to complete and fax back to his surgeon/  CB# [phone]

## 2019-06-05 ENCOUNTER — Telehealth: Payer: Self-pay | Admitting: Family

## 2019-06-05 NOTE — Telephone Encounter (Signed)
Pt dropped off form to be filled out for Surgery 06/05/19 for [place]. Numbered and placed in tray.

## 2019-06-05 NOTE — Telephone Encounter (Signed)
Patient advised to bring form in to the office. He was seen in December and may not need another visit. Melissa will get for and let us know if he needs another appointment.

## 2019-06-18 ENCOUNTER — Telehealth: Payer: Self-pay | Admitting: Family

## 2019-06-18 NOTE — Telephone Encounter (Signed)
Please contact pt and schedule a surgical clearance appointment with me.

## 2019-06-20 NOTE — Telephone Encounter (Signed)
Patient advised appointment needed, he was scheduled for Friday 29th at 4 pm for medical clearance.

## 2019-06-22 ENCOUNTER — Ambulatory Visit (INDEPENDENT_AMBULATORY_CARE_PROVIDER_SITE_OTHER): Payer: Medicare Other | Admitting: Family

## 2019-06-22 ENCOUNTER — Other Ambulatory Visit: Payer: Self-pay

## 2019-06-22 ENCOUNTER — Ambulatory Visit (HOSPITAL_BASED_OUTPATIENT_CLINIC_OR_DEPARTMENT_OTHER)
Admission: RE | Admit: 2019-06-22 | Discharge: 2019-06-22 | Disposition: A | Discharge status: Home / Self Care | Payer: Medicare Other | Source: Ambulatory Visit | Attending: Family | Admitting: Family

## 2019-06-22 VITALS — BP 136/77 | HR 84 | Temp 97.5°F | Resp 16 | Ht 70.0 in | Wt 205.0 lb

## 2019-06-22 DIAGNOSIS — Z01811 Encounter for preprocedural respiratory examination: Secondary | ICD-10-CM | POA: Insufficient documentation

## 2019-06-22 DIAGNOSIS — I10 Essential (primary) hypertension: Secondary | ICD-10-CM | POA: Diagnosis not present

## 2019-06-22 DIAGNOSIS — Z01818 Encounter for other preprocedural examination: Secondary | ICD-10-CM | POA: Diagnosis not present

## 2019-06-22 NOTE — Progress Notes (Signed)
Subjective:    Patient ID: Austin Faulkner, male    DOB: 08-11-1955, 64 y.o.   MRN: [id number]  HPI  Patient presents today for pre-op clearance.  He is scheduled for a Total Hip arthroplasty on 06/26/19. He has no complaints today.    Review of Systems  Constitutional: Negative for unexpected weight change.  HENT: Negative for rhinorrhea.   Respiratory: Negative for cough and shortness of breath.   Cardiovascular: Negative for chest pain and leg swelling.  Gastrointestinal: Negative for blood in stool, constipation and diarrhea.  Genitourinary: Negative for dysuria, frequency and hematuria.  Musculoskeletal: Positive for arthralgias (right hip pain).  Skin: Negative for rash.  Neurological: Negative for headaches.  Hematological: Negative for adenopathy.  Psychiatric/Behavioral:       Denies depression   Past Medical History:  Diagnosis Date  . Asthma   . COPD (chronic obstructive pulmonary disease) ([place])   . Depression   . Diverticulitis   . Diverticulitis of colon with perforation 03/04/2016  . Dyspnea    secondary to seasonal allergies; or illness  . GERD (gastroesophageal reflux disease)   . Heart murmur    slight since birth  . Hypertension   . Pneumonia yrs ago  . Rheumatoid arthritis ([place])   . Seasonal allergies   . Shoulder dislocation      Social History   Socioeconomic History  . Marital status: Married    Spouse name: Silva Bandy  . Number of children: 3  . Years of education: Not on file  . Highest education level: Not on file  Occupational History  . Occupation: retired  Tobacco Use  . Smoking status: Passive Smoke Exposure - Never Smoker  . Smokeless tobacco: Never Used  Substance and Sexual Activity  . Alcohol use: No  . Drug use: No  . Sexual activity: Yes    Partners: Female  Other Topics Concern  . Not on file  Social History Narrative  . Not on file   Social Determinants of Health   Financial Resource Strain: Low Risk   . Difficulty  of Paying Living Expenses: Not hard at all  Food Insecurity: No Food Insecurity  . Worried About Charity fundraiser in the Last Year: Never true  . Ran Out of Food in the Last Year: Never true  Transportation Needs: No Transportation Needs  . Lack of Transportation (Medical): No  . Lack of Transportation (Non-Medical): No  Physical Activity: Unknown  . Days of Exercise per Week: 0 days  . Minutes of Exercise per Session: Not on file  Stress:   . Feeling of Stress : Not on file  Social Connections: Slightly Isolated  . Frequency of Communication with Friends and Family: More than three times a week  . Frequency of Social Gatherings with Friends and Family: Never  . Attends Religious Services: More than 4 times per year  . Active Member of Clubs or Organizations: No  . Attends Archivist Meetings: Never  . Marital Status: Married  Human resources officer Violence: Not At Risk  . Fear of Current or Ex-Partner: No  . Emotionally Abused: No  . Physically Abused: No  . Sexually Abused: No    Past Surgical History:  Procedure Laterality Date  . knee arthroscopy and cartlidge repair Bilateral    5 on right and 5 on left  . SHOULDER ARTHROSCOPY Left 2008  . SMALL INTESTINE SURGERY  2017   small intestine resection  . TOTAL KNEE ARTHROPLASTY Bilateral  Family History  Problem Relation Age of Onset  . Lung cancer Mother   . Heart attack Father 25       "healthier than I am"  . Lupus Sister   . Stroke Paternal Grandmother   . Multiple myeloma Sister        BMT at [facility]    Allergies  Allergen Reactions  . Azithromycin     Current Outpatient Medications on File Prior to Visit  Medication Sig Dispense Refill  . acetaminophen (TYLENOL) 325 MG tablet Take 1-2 tablets (325-650 mg total) by mouth every 6 (six) hours as needed for fever, headache, mild pain or moderate pain.    Marland Kitchen albuterol (PROVENTIL HFA) 108 (90 BASE) MCG/ACT inhaler Inhale 2 puffs into the lungs every 6  (six) hours as needed for wheezing or shortness of breath.     . Ascorbic Acid (VITAMIN C) 1000 MG tablet Take 1,000 mg by mouth daily.    . budesonide-formoterol (SYMBICORT) 160-4.5 MCG/ACT inhaler Inhale 2 puffs into the lungs 2 (two) times daily.     . cetirizine (ZYRTEC) 10 MG tablet Take 1 tablet (10 mg total) by mouth daily. 30 tablet 11  . COD LIVER OIL PO Take 5 mLs by mouth daily.    . famotidine (PEPCID) 20 MG tablet Take 1 tablet (20 mg total) by mouth 2 (two) times daily.    . Flaxseed, Linseed, (FLAX SEED OIL PO) Take 5 mLs by mouth 2 (two) times daily.    . fluticasone (FLONASE) 50 MCG/ACT nasal spray Place 1-2 sprays into both nostrils daily as needed for allergies or rhinitis.    . hydrochlorothiazide (HYDRODIURIL) 25 MG tablet Take 25 mg by mouth daily.    . hydroxychloroquine (PLAQUENIL) 200 MG tablet Take 200 mg by mouth 2 (two) times daily.    Marland Kitchen ibuprofen (ADVIL,MOTRIN) 800 MG tablet Take 800 mg by mouth daily.    Marland Kitchen ipratropium-albuterol (DUONEB) 0.5-2.5 (3) MG/3ML SOLN Take 3 mLs by nebulization every 6 (six) hours as needed. 360 mL   . meloxicam (MOBIC) 15 MG tablet Take 1 tablet (15 mg total) by mouth daily. 30 tablet 0  . Multiple Vitamin (MULTIVITAMIN WITH MINERALS) TABS tablet Take 1 tablet by mouth daily.    Mable Fill (NAPHCON-A OP) Apply 1 drop to eye daily as needed (allergies).    Marland Kitchen omeprazole (PRILOSEC) 20 MG capsule Take 20 mg by mouth daily.     Marland Kitchen PARoxetine (PAXIL) 20 MG tablet Take 20 mg by mouth daily.    Marland Kitchen sulfaSALAzine (AZULFIDINE) 500 MG tablet Take 500 mg by mouth 2 (two) times daily.    . tadalafil (CIALIS) 20 MG tablet     . tamsulosin (FLOMAX) 0.4 MG CAPS capsule Take 0.4 mg by mouth.    . Tiotropium Bromide Monohydrate (SPIRIVA RESPIMAT) 1.25 MCG/ACT AERS Inhale 2 puffs into the lungs 2 (two) times daily.    . traZODone (DESYREL) 100 MG tablet Take 100 mg by mouth at bedtime.    . TURMERIC CURCUMIN PO Take 900 mg by mouth daily.       No current facility-administered medications on file prior to visit.    There were no vitals taken for this visit.      Objective:   Physical Exam        Assessment & Plan:  Pre-op evaluation- reviewed EKG results from [facility]:   61    BPM          Sinus rhythm with Premature atrial  complexes Possible LVH Otherwise normal  CXR is clear. BMET notes acute renal insufficiency and hypernatremia- see 2/1 phone note. Will postpone surgery- orthopedic office has been updated.  Pt has been updated.    HTN- BP stable.  BP Readings from Last 3 Encounters:  06/22/19 136/77  04/24/19 120/84  06/28/18 (!) 146/91   30 minutes spent on today's visit.  This visit occurred during the SARS-CoV-2 public health emergency.  Safety protocols were in place, including screening questions prior to the visit, additional usage of staff PPE, and extensive cleaning of exam room while observing appropriate contact time as indicated for disinfecting solutions.

## 2019-06-22 NOTE — Patient Instructions (Signed)
Please complete x-ray on the first floor.  

## 2019-06-23 LAB — URINE CULTURE
MICRO NUMBER:: 10096380
Result:: NO GROWTH
SPECIMEN QUALITY:: ADEQUATE

## 2019-06-23 LAB — COMPREHENSIVE METABOLIC PANEL
AG Ratio: 1.9 (calc) (ref 1.0–2.5)
ALT: 23 U/L (ref 9–46)
AST: 22 U/L (ref 10–35)
Albumin: 4.7 g/dL (ref 3.6–5.1)
Alkaline phosphatase (APISO): 59 U/L (ref 35–144)
BUN/Creatinine Ratio: 16 (calc) (ref 6–22)
BUN: 21 mg/dL (ref 7–25)
CO2: 18 mmol/L — ABNORMAL LOW (ref 20–32)
Calcium: 9.6 mg/dL (ref 8.6–10.3)
Chloride: 114 mmol/L — ABNORMAL HIGH (ref 98–110)
Creat: 1.35 mg/dL — ABNORMAL HIGH (ref 0.70–1.25)
Globulin: 2.5 g/dL (calc) (ref 1.9–3.7)
Glucose, Bld: 120 mg/dL — ABNORMAL HIGH (ref 65–99)
Potassium: 4.2 mmol/L (ref 3.5–5.3)
Sodium: 155 mmol/L — ABNORMAL HIGH (ref 135–146)
Total Bilirubin: 0.4 mg/dL (ref 0.2–1.2)
Total Protein: 7.2 g/dL (ref 6.1–8.1)

## 2019-06-23 LAB — PROTIME-INR
INR: 1
Prothrombin Time: 10.2 s (ref 9.0–11.5)

## 2019-06-25 ENCOUNTER — Telehealth: Payer: Self-pay | Admitting: Family

## 2019-06-25 DIAGNOSIS — E870 Hyperosmolality and hypernatremia: Secondary | ICD-10-CM

## 2019-06-25 NOTE — Telephone Encounter (Signed)
Left message for Selena Batten on voicemail.

## 2019-06-25 NOTE — Telephone Encounter (Signed)
Per patient, surgery coordinator at [facility] will like to speak to pcp sometime today. Her number is [phone].

## 2019-06-25 NOTE — Telephone Encounter (Signed)
Advised pt that lab work shows high sodium, dehydration and I don't feel that he is safe to proceed with surgery at this time.  I have advised him to stop HCTZ for now, work on hydration, avoid added salt and return to the lab on Thursday for follow up bmet.  Pt verbalizes understanding.   Austin Faulkner- Please call his surgeon's office this AM and advise them that his surgery for tomorrow needs to be postponed  Until we can get his lab work stabilized.

## 2019-06-25 NOTE — Telephone Encounter (Signed)
Message left for Patient's surgeon to be aware procedure will need to be postponed. Front staff at [facility] at [employer], [place], took detail message as to why pcp was not feel it is safe to precede at this time.

## 2019-06-25 NOTE — Telephone Encounter (Signed)
Spoke with Selena Batten, advised her we would let them know when his labs are stable for surgery.

## 2019-06-26 ENCOUNTER — Telehealth: Payer: Self-pay | Admitting: Family

## 2019-06-26 ENCOUNTER — Encounter: Payer: Self-pay | Admitting: Family

## 2019-06-26 NOTE — Telephone Encounter (Signed)
Please contact pt and let him know that I was reviewing his medications and I would like him to stop meloxicam and start tylenol as needed for pain. Meloxicam can worsen his kidney function.

## 2019-06-27 NOTE — Telephone Encounter (Signed)
Patient reports he has been off the Meloxicam for about 3 weeks now and taking extra strength tylenol.  He wanted to report his surgery has been rescheduled for 07-23-2019

## 2019-06-28 ENCOUNTER — Telehealth: Payer: Self-pay | Admitting: Family

## 2019-06-28 ENCOUNTER — Other Ambulatory Visit (INDEPENDENT_AMBULATORY_CARE_PROVIDER_SITE_OTHER): Payer: Medicare Other

## 2019-06-28 ENCOUNTER — Other Ambulatory Visit: Payer: Self-pay

## 2019-06-28 DIAGNOSIS — Z01818 Encounter for other preprocedural examination: Secondary | ICD-10-CM

## 2019-06-28 DIAGNOSIS — E870 Hyperosmolality and hypernatremia: Secondary | ICD-10-CM | POA: Diagnosis not present

## 2019-06-28 LAB — CBC WITH DIFFERENTIAL/PLATELET
Basophils Absolute: 0 10*3/uL (ref 0.0–0.1)
Basophils Relative: 0.5 % (ref 0.0–3.0)
Eosinophils Absolute: 0 10*3/uL (ref 0.0–0.7)
Eosinophils Relative: 0.3 % (ref 0.0–5.0)
HCT: 39.9 % (ref 39.0–52.0)
Hemoglobin: 13.1 g/dL (ref 13.0–17.0)
Lymphocytes Relative: 34.4 % (ref 12.0–46.0)
Lymphs Abs: 2.9 10*3/uL (ref 0.7–4.0)
MCHC: 32.8 g/dL (ref 30.0–36.0)
MCV: 92.1 fl (ref 78.0–100.0)
Monocytes Absolute: 0.7 10*3/uL (ref 0.1–1.0)
Monocytes Relative: 8.4 % (ref 3.0–12.0)
Neutro Abs: 4.7 10*3/uL (ref 1.4–7.7)
Neutrophils Relative %: 56.4 % (ref 43.0–77.0)
Platelets: 366 10*3/uL (ref 150.0–400.0)
RBC: 4.33 Mil/uL (ref 4.22–5.81)
RDW: 13.5 % (ref 11.5–15.5)
WBC: 8.4 10*3/uL (ref 4.0–10.5)

## 2019-06-28 LAB — BASIC METABOLIC PANEL
BUN: 17 mg/dL (ref 6–23)
CO2: 28 mEq/L (ref 19–32)
Calcium: 9.1 mg/dL (ref 8.4–10.5)
Chloride: 106 mEq/L (ref 96–112)
Creatinine, Ser: 1.08 mg/dL (ref 0.40–1.50)
GFR: 84.04 mL/min (ref >60.00)
Glucose, Bld: 104 mg/dL — ABNORMAL HIGH (ref 70–99)
Potassium: 4.1 mEq/L (ref 3.5–5.1)
Sodium: 140 mEq/L (ref 135–145)

## 2019-06-28 NOTE — Telephone Encounter (Signed)
Patient states he needs lab results from 06/22/2019, 06/28/2019 faxed to [facility] @ [place]  [phone].  Patient would also like lab results added to his mychart.

## 2019-06-29 ENCOUNTER — Other Ambulatory Visit: Payer: Self-pay | Admitting: Family

## 2019-06-29 ENCOUNTER — Encounter: Payer: Self-pay | Admitting: Family

## 2019-06-29 NOTE — Telephone Encounter (Signed)
Patient advised will fax results with medical clearance if ok per provider.

## 2019-07-24 ENCOUNTER — Encounter: Payer: Medicare Other | Admitting: Family

## 2019-07-24 MED ORDER — LACTATED RINGERS IV SOLN
INTRAVENOUS | Status: DC
Start: ? — End: 2019-07-24

## 2019-07-24 MED ORDER — SENNOSIDES-DOCUSATE SODIUM 8.6-50 MG PO TABS
2.00 | ORAL_TABLET | ORAL | Status: DC
Start: 2019-07-26 — End: 2019-07-24

## 2019-07-24 MED ORDER — BISACODYL 10 MG RE SUPP
10.00 | RECTAL | Status: DC
Start: ? — End: 2019-07-24

## 2019-07-24 MED ORDER — TRAZODONE HCL 100 MG PO TABS
100.00 | ORAL_TABLET | ORAL | Status: DC
Start: 2019-07-26 — End: 2019-07-24

## 2019-07-24 MED ORDER — CEFAZOLIN SODIUM-DEXTROSE 2-4 GM/100ML-% IV SOLN
2.00 | INTRAVENOUS | Status: DC
Start: 2019-07-25 — End: 2019-07-24

## 2019-07-24 MED ORDER — ONDANSETRON HCL 4 MG/2ML IJ SOLN
4.00 | INTRAMUSCULAR | Status: DC
Start: ? — End: 2019-07-24

## 2019-07-24 MED ORDER — SULFASALAZINE 500 MG PO TABS
500.00 | ORAL_TABLET | ORAL | Status: DC
Start: 2019-07-26 — End: 2019-07-24

## 2019-07-24 MED ORDER — CETIRIZINE HCL 10 MG PO TABS
10.00 | ORAL_TABLET | ORAL | Status: DC
Start: 2019-07-26 — End: 2019-07-24

## 2019-07-24 MED ORDER — ASPIRIN 325 MG PO TBEC
325.00 | DELAYED_RELEASE_TABLET | ORAL | Status: DC
Start: 2019-07-26 — End: 2019-07-24

## 2019-07-24 MED ORDER — HYDROCHLOROTHIAZIDE 25 MG PO TABS
25.00 | ORAL_TABLET | ORAL | Status: DC
Start: 2019-07-27 — End: 2019-07-24

## 2019-07-24 MED ORDER — HYDROCODONE-ACETAMINOPHEN 5-325 MG PO TABS
1.00 | ORAL_TABLET | ORAL | Status: DC
Start: ? — End: 2019-07-24

## 2019-07-24 MED ORDER — TAMSULOSIN HCL 0.4 MG PO CAPS
0.40 | ORAL_CAPSULE | ORAL | Status: DC
Start: 2019-07-26 — End: 2019-07-24

## 2019-07-24 MED ORDER — ENEMA 7-19 GM/118ML RE ENEM
1.00 | ENEMA | RECTAL | Status: DC
Start: ? — End: 2019-07-24

## 2019-07-24 MED ORDER — PANTOPRAZOLE SODIUM 40 MG PO TBEC
40.00 | DELAYED_RELEASE_TABLET | ORAL | Status: DC
Start: 2019-07-27 — End: 2019-07-24

## 2019-07-24 MED ORDER — HYDROXYCHLOROQUINE SULFATE 200 MG PO TABS
200.00 | ORAL_TABLET | ORAL | Status: DC
Start: 2019-07-26 — End: 2019-07-24

## 2019-07-24 MED ORDER — OXYCODONE HCL 5 MG PO TABS
5.00 | ORAL_TABLET | ORAL | Status: DC
Start: ? — End: 2019-07-24

## 2019-07-26 MED ORDER — OXYCODONE HCL 5 MG PO TABS
10.00 | ORAL_TABLET | ORAL | Status: DC
Start: ? — End: 2019-07-26

## 2019-12-12 ENCOUNTER — Telehealth: Payer: Self-pay | Admitting: Family

## 2019-12-12 NOTE — Telephone Encounter (Signed)
Patient dropped off  dmv form for disability . Patient would like to be notified once completed . Form dropped off in basket

## 2019-12-13 NOTE — Telephone Encounter (Signed)
DMV form placed in providers folder.

## 2020-01-09 ENCOUNTER — Encounter: Payer: Self-pay | Admitting: Family

## 2020-01-09 ENCOUNTER — Ambulatory Visit (INDEPENDENT_AMBULATORY_CARE_PROVIDER_SITE_OTHER): Payer: Medicare Other | Admitting: Family

## 2020-01-09 ENCOUNTER — Other Ambulatory Visit: Payer: Self-pay

## 2020-01-09 VITALS — BP 120/75 | HR 76 | Temp 98.8°F | Resp 16 | Ht 70.0 in | Wt 192.0 lb

## 2020-01-09 DIAGNOSIS — Z23 Encounter for immunization: Secondary | ICD-10-CM

## 2020-01-09 DIAGNOSIS — Z0000 Encounter for general adult medical examination without abnormal findings: Secondary | ICD-10-CM

## 2020-01-09 NOTE — Progress Notes (Signed)
Subjective:    Austin Faulkner is a 64 y.o. male who presents for Medicare Annual/Subsequent preventive examination.   Preventive Screening-Counseling & Management  Tobacco Social History   Tobacco Use  Smoking Status Passive Smoke Exposure - Never Smoker  Smokeless Tobacco Never Used    Problems Prior to Visit  1. gerd-  Maintained on prilosec.  2.  RA- maintained on plaquenil  3.  COPD- reports breathing is stable with his current regimen.    4.  Depression- maintained on paxil. Reports fair control- seeing psychiatrist.  Sister died yesterday which is a struggle for him.      Current Problems (verified) Patient Active Problem List   Diagnosis Date Noted   Depression 04/26/2019   Chronic obstructive pulmonary disease ([place]) 04/26/2019   Benign prostatic hyperplasia 04/26/2019   Diverticular disease 06/23/2016   Essential hypertension 03/05/2016   Rheumatoid arthritis of multiple sites without rheumatoid factor ([place]) 03/05/2016   External hemorrhoid, bleeding 02/14/2012   GERD (gastroesophageal reflux disease) 02/14/2012   Chronic back pain 07/03/2010   TESTICULAR MASS, LEFT 02/25/2010   DEGENERATIVE JOINT DISEASE 44/81/8563   SYSTOLIC MURMUR 14/97/0263   Moderate persistent asthma, uncomplicated 78/58/8502    Medications Prior to Visit Current Outpatient Medications on File Prior to Visit  Medication Sig Dispense Refill   acetaminophen (TYLENOL) 325 MG tablet Take 1-2 tablets (325-650 mg total) by mouth every 6 (six) hours as needed for fever, headache, mild pain or moderate pain.     albuterol (PROVENTIL HFA) 108 (90 BASE) MCG/ACT inhaler Inhale 2 puffs into the lungs every 6 (six) hours as needed for wheezing or shortness of breath.      Ascorbic Acid (VITAMIN C) 1000 MG tablet Take 1,000 mg by mouth daily.     budesonide-formoterol (SYMBICORT) 160-4.5 MCG/ACT inhaler Inhale 2 puffs into the lungs 2 (two) times daily.      cetirizine (ZYRTEC)  10 MG tablet Take 1 tablet (10 mg total) by mouth daily. 30 tablet 11   famotidine (PEPCID) 20 MG tablet Take 1 tablet (20 mg total) by mouth 2 (two) times daily.     fluticasone (FLONASE) 50 MCG/ACT nasal spray Place 1-2 sprays into both nostrils daily as needed for allergies or rhinitis.     hydroxychloroquine (PLAQUENIL) 200 MG tablet Take 200 mg by mouth 2 (two) times daily.     ipratropium-albuterol (DUONEB) 0.5-2.5 (3) MG/3ML SOLN Take 3 mLs by nebulization every 6 (six) hours as needed. 360 mL    Multiple Vitamin (MULTIVITAMIN WITH MINERALS) TABS tablet Take 1 tablet by mouth daily.     Naphazoline-Pheniramine (NAPHCON-A OP) Apply 1 drop to eye daily as needed (allergies).     omeprazole (PRILOSEC) 20 MG capsule Take 20 mg by mouth daily.      PARoxetine (PAXIL) 20 MG tablet Take 20 mg by mouth daily.     sulfaSALAzine (AZULFIDINE) 500 MG tablet Take 500 mg by mouth 2 (two) times daily.     tadalafil (CIALIS) 20 MG tablet      tamsulosin (FLOMAX) 0.4 MG CAPS capsule Take 0.4 mg by mouth.     Tiotropium Bromide Monohydrate (SPIRIVA RESPIMAT) 1.25 MCG/ACT AERS Inhale 2 puffs into the lungs 2 (two) times daily.     traZODone (DESYREL) 100 MG tablet Take 100 mg by mouth at bedtime.     TURMERIC CURCUMIN PO Take 900 mg by mouth daily.      No current facility-administered medications on file prior to visit.  Current Medications (verified) Current Outpatient Medications  Medication Sig Dispense Refill   acetaminophen (TYLENOL) 325 MG tablet Take 1-2 tablets (325-650 mg total) by mouth every 6 (six) hours as needed for fever, headache, mild pain or moderate pain.     albuterol (PROVENTIL HFA) 108 (90 BASE) MCG/ACT inhaler Inhale 2 puffs into the lungs every 6 (six) hours as needed for wheezing or shortness of breath.      Ascorbic Acid (VITAMIN C) 1000 MG tablet Take 1,000 mg by mouth daily.     budesonide-formoterol (SYMBICORT) 160-4.5 MCG/ACT inhaler Inhale 2 puffs into  the lungs 2 (two) times daily.      cetirizine (ZYRTEC) 10 MG tablet Take 1 tablet (10 mg total) by mouth daily. 30 tablet 11   famotidine (PEPCID) 20 MG tablet Take 1 tablet (20 mg total) by mouth 2 (two) times daily.     fluticasone (FLONASE) 50 MCG/ACT nasal spray Place 1-2 sprays into both nostrils daily as needed for allergies or rhinitis.     hydroxychloroquine (PLAQUENIL) 200 MG tablet Take 200 mg by mouth 2 (two) times daily.     ipratropium-albuterol (DUONEB) 0.5-2.5 (3) MG/3ML SOLN Take 3 mLs by nebulization every 6 (six) hours as needed. 360 mL    Multiple Vitamin (MULTIVITAMIN WITH MINERALS) TABS tablet Take 1 tablet by mouth daily.     Naphazoline-Pheniramine (NAPHCON-A OP) Apply 1 drop to eye daily as needed (allergies).     omeprazole (PRILOSEC) 20 MG capsule Take 20 mg by mouth daily.      PARoxetine (PAXIL) 20 MG tablet Take 20 mg by mouth daily.     sulfaSALAzine (AZULFIDINE) 500 MG tablet Take 500 mg by mouth 2 (two) times daily.     tadalafil (CIALIS) 20 MG tablet      tamsulosin (FLOMAX) 0.4 MG CAPS capsule Take 0.4 mg by mouth.     Tiotropium Bromide Monohydrate (SPIRIVA RESPIMAT) 1.25 MCG/ACT AERS Inhale 2 puffs into the lungs 2 (two) times daily.     traZODone (DESYREL) 100 MG tablet Take 100 mg by mouth at bedtime.     TURMERIC CURCUMIN PO Take 900 mg by mouth daily.      No current facility-administered medications for this visit.     Allergies (verified) Azithromycin   PAST HISTORY  Family History Family History  Problem Relation Age of Onset   Lung cancer Mother    Heart attack Father 74       "healthier than I am"   Lupus Sister    Stroke Paternal Grandmother    Multiple myeloma Sister        BMT at [employer]    Social History Social History   Tobacco Use   Smoking status: Passive Smoke Exposure - Never Smoker   Smokeless tobacco: Never Used  Substance Use Topics   Alcohol use: No    Are there smokers in your home (other  than you)?  No  Risk Factors Current exercise habits: active in yard dietary issues discussed: healthy  Cardiac risk factors: age, HTN  Depression Screen (Note: if answer to either of the following is "Yes", a more complete depression screening is indicated)   Q1: Over the past two weeks, have you felt down, depressed or hopeless? Yes, due to loss of his sister  Q2: Over the past two weeks, have you felt little interest or pleasure in doing things? No   Have you lost interest or pleasure in daily life? no  Do you often feel hopeless?  no  Do you cry easily over simple problems? no  Activities of Daily Living In your present state of health, do you have any difficulty performing the following activities?:  Driving? no Managing money? no Feeding yourself? no Getting from bed to chair? no Climbing a flight of stairs?no Preparing food and eating?:no Bathing or showering? no Getting dressed: no Getting to the toilet? no Using the toilet: no Moving around from place to place: no   Are you sexually active?  sometimes  Do you have more than one partner? no  Hearing Difficulties:  Do you often ask people to speak up or repeat themselves? Yes, has not had a hearing test Do you experience ringing or noises in your ears? No Do you have difficulty understanding soft or whispered voices? yes   Do you feel that you have a problem with memory? no  Do you often misplace items? no  Do you feel safe at home?  yes  Cognitive Testing  Alert?yes Normal Appearance?yes  Oriented to person?2021 Place?yes   Time?yes  Recall of three objects?yes  Can perform simple calculations? yes  Displays appropriate judgment?yes  Can read the correct time from a watch face?yes   Advanced Directives have been discussed with the patient?yes, does not have advanced directives, but gave pt a copy of the  advanced directive.   List the Names of Other Physician/Practitioners you currently  use:  Multiple providers at the [facility] any recent Medical Services you may have received from other than [facility] providers in the past year (date may be approximate).  Immunization History  Administered Date(s) Administered   Influenza Split 02/14/2012   Influenza Whole 04/07/2007   PFIZER SARS-COV-2 Vaccination 08/06/2019, 09/03/2019   Pneumococcal Polysaccharide-23 04/07/2007   Td 05/24/1996   Tdap 01/22/2010    Screening Tests Health Maintenance  Topic Date Due   Hepatitis C Screening  Never done   HIV Screening  Never done   COLONOSCOPY  Never done   INFLUENZA VACCINE  12/23/2019   TETANUS/TDAP  01/23/2020   COVID-19 Vaccine  Completed    All answers were reviewed with the patient and necessary referrals were made:  Nance Pear, NP   01/09/2020   Pmhx, Pshx,allergies, family hx all reviewed  Review of Systems See above  Objective:      Hearing Screening   125Hz  250Hz  500Hz  1000Hz  2000Hz  3000Hz  4000Hz  6000Hz  8000Hz   Right ear:           Left ear:             Visual Acuity Screening   Right eye Left eye Both eyes  Without correction:     With correction: 20/20 20/20 20/20     Blood pressure 120/75, pulse 76, temperature 98.8 F (37.1 C), temperature source Oral, resp. rate 16, height 5' 10"  (1.778 m), weight 192 lb (87.1 kg), SpO2 99 %. Body mass index is 27.55 kg/m.     Physical Exam  Constitutional: He is oriented to person, place, and time. He appears well-developed and well-nourished. No distress.  HENT:  Head: Normocephalic and atraumatic.  Right Ear: Tympanic membrane and ear canal normal.  Left Ear: Tympanic membrane and ear canal normal.  Mouth/Throat: Oropharynx is clear and moist.  Eyes: Pupils are equal, round, and reactive to light. No scleral icterus.  Neck: Normal range of motion. No thyromegaly present.  Cardiovascular: Normal rate and regular rhythm.   No murmur heard. Pulmonary/Chest: Effort normal and breath  sounds normal. No  respiratory distress. He has no wheezes. He has no rales. He exhibits no tenderness.  Abdominal: Soft. Bowel sounds are normal. He exhibits no distension and no mass. There is no tenderness. There is no rebound and no guarding.  Musculoskeletal: He exhibits no edema.  Lymphadenopathy:    He has no cervical adenopathy.  Neurological: He is alert and oriented to person, place, and time. He has normal patellar reflexes. He exhibits normal muscle tone. Coordination normal.  Skin: Skin is warm and dry.  Psychiatric: He has a normal mood and affect. His behavior is normal. Judgment and thought content normal.           Assessment & Plan:    Assessment:     Plan:     During the course of the visit the patient was educated and counseled about appropriate screening and preventive services including:    Pneumovax 23 booster today.   Diet review for nutrition referral? Yes ____  Not Indicated _x___   Patient Instructions (the written plan) was given to the patient.  Medicare Attestation I have personally reviewed: The patient's medical and social history Their use of alcohol, tobacco or illicit drugs Their current medications and supplements The patient's functional ability including ADLs,fall risks, home safety risks, cognitive, and hearing and visual impairment Diet and physical activities Evidence for depression or mood disorders  The patient's weight, height, BMI, and visual acuity have been recorded in the chart.  I have made referrals, counseling, and provided education to the patient based on review of the above and I have provided the patient with a written personalized care plan for preventive services.     Nance Pear, NP   01/09/2020

## 2020-01-09 NOTE — Patient Instructions (Addendum)
Please obtain Shingrix and Tetanus shot at the [place].  If they cannot give it to you- you can go to your local pharmacy. Please request a hearing test at the [facility].

## 2020-01-10 ENCOUNTER — Telehealth: Payer: Self-pay | Admitting: Family

## 2020-01-10 ENCOUNTER — Encounter: Payer: Self-pay | Admitting: Family

## 2020-01-12 NOTE — Telephone Encounter (Signed)
Opened in error

## 2020-01-14 ENCOUNTER — Telehealth: Payer: Self-pay | Admitting: Family

## 2020-01-14 NOTE — Telephone Encounter (Signed)
Opened in error

## 2020-01-18 ENCOUNTER — Telehealth: Payer: Self-pay | Admitting: Family

## 2020-01-18 NOTE — Telephone Encounter (Signed)
Could you please send a records request to [facility] for colo results, PSA and most recent blood work.  Hopefully they won't require him to sign a records release. Let me know if they do.  tks

## 2020-01-21 NOTE — Telephone Encounter (Signed)
FYI Per patient he had colonoscopy with Dr. Candelaria Stagers in [place]. Records release faxed to Dr. Candelaria Stagers at [facility].  Patient brought in the last set of labs done at the [place] to his last office visit. This records should be going through scanning.

## 2020-01-26 ENCOUNTER — Telehealth: Payer: Self-pay | Admitting: Family

## 2020-01-26 NOTE — Telephone Encounter (Signed)
Opened in error

## 2020-05-24 HISTORY — PX: NECK SURGERY: SHX720

## 2021-01-29 ENCOUNTER — Encounter: Payer: Self-pay | Admitting: Family

## 2021-04-09 ENCOUNTER — Ambulatory Visit
Admission: RE | Admit: 2021-04-09 | Discharge: 2021-04-09 | Disposition: A | Discharge status: Home / Self Care | Payer: Self-pay | Source: Ambulatory Visit | Attending: Internal Medicine | Admitting: Internal Medicine

## 2021-04-09 ENCOUNTER — Other Ambulatory Visit: Payer: Self-pay

## 2021-04-09 ENCOUNTER — Ambulatory Visit (INDEPENDENT_AMBULATORY_CARE_PROVIDER_SITE_OTHER): Payer: Medicare Other | Admitting: Internal Medicine

## 2021-04-09 ENCOUNTER — Encounter: Payer: Self-pay | Admitting: Internal Medicine

## 2021-04-09 VITALS — BP 116/78 | HR 91 | Temp 98.7°F | Ht 70.0 in | Wt 186.0 lb

## 2021-04-09 DIAGNOSIS — R911 Solitary pulmonary nodule: Secondary | ICD-10-CM

## 2021-04-09 DIAGNOSIS — J4520 Mild intermittent asthma, uncomplicated: Secondary | ICD-10-CM | POA: Diagnosis not present

## 2021-04-09 DIAGNOSIS — J302 Other seasonal allergic rhinitis: Secondary | ICD-10-CM | POA: Diagnosis not present

## 2021-04-09 NOTE — Patient Instructions (Addendum)
Please schedule follow up scheduled with myself in 6 months.  If my schedule is not open yet, we will contact you with a reminder closer to that time. If you haven't heard anything - call our office!  Before your next visit I would like you to have: CT scan of your chest in May 2023. - we will call you to schedule this. I will see you afterwards.   Continue your albuterol for asthma.    By learning about asthma and how it can be controlled, you take an important step toward managing this disease. Work closely with your asthma care team to learn all you can about your asthma, how to avoid triggers, what your medications do, and how to take them correctly. With proper care, you can live free of asthma symptoms and maintain a normal, healthy lifestyle.   What is asthma? Asthma is a chronic disease that affects the airways of the lungs. During normal breathing, the bands of muscle that surround the airways are relaxed and air moves freely. During an asthma episode or "attack," there are three main changes that stop air from moving easily through the airways: The bands of muscle that surround the airways tighten and make the airways narrow. This tightening is called bronchospasm.  The lining of the airways becomes swollen or inflamed.  The cells that line the airways produce more mucus, which is thicker than normal and clogs the airways.  These three factors - bronchospasm, inflammation, and mucus production - cause symptoms such as difficulty breathing, wheezing, and coughing.  What are the most common symptoms of asthma? Asthma symptoms are not the same for everyone. They can even change from episode to episode in the same person. Also, you may have only one symptom of asthma, such as cough, but another person may have all the symptoms of asthma. It is important to know all the symptoms of asthma and to be aware that your asthma can present in any of these ways at any time. The most common symptoms  include: Coughing, especially at night  Shortness of breath  Wheezing  Chest tightness, pain, or pressure   Who is affected by asthma? Asthma affects 22 million Americans; about 6 million of these are children under age 66. People who have a family history of asthma have an increased risk of developing the disease. Asthma is also more common in people who have allergies or who are exposed to tobacco smoke. However, anyone can develop asthma at any time. Some people may have asthma all of their lives, while others may develop it as adults.  What causes asthma? The airways in a person with asthma are very sensitive and react to many things, or "triggers." Contact with these triggers causes asthma symptoms. One of the most important parts of asthma control is to identify your triggers and then avoid them when possible. The only trigger you do not want to avoid is exercise. Pre-treatment with medicines before exercise can allow you to stay active yet avoid asthma symptoms. Common asthma triggers include: Infections (colds, viruses, flu, sinus infections)  Exercise  Weather (changes in temperature and/or humidity, cold air)  Tobacco smoke  Allergens (dust mites, pollens, pets, mold spores, cockroaches, and sometimes foods)  Irritants (strong odors from cleaning products, perfume, wood smoke, air pollution)  Strong emotions such as crying or laughing hard  Some medications   How is asthma diagnosed? To diagnose asthma, your doctor will first review your medical history, family history, and symptoms.  Your doctor will want to know any past history of breathing problems you may have had, as well as a family history of asthma, allergies, eczema (a bumpy, itchy skin rash caused by allergies), or other lung disease. It is important that you describe your symptoms in detail (cough, wheeze, shortness of breath, chest tightness), including when and how often they occur. The doctor will perform a physical  examination and listen to your heart and lungs. He or she may also order breathing tests, allergy tests, blood tests, and chest and sinus X-rays. The tests will find out if you do have asthma and if there are any other conditions that are contributing factors.  How is asthma treated? Asthma can be controlled, but not cured. It is not normal to have frequent symptoms, trouble sleeping, or trouble completing tasks. Appropriate asthma care will prevent symptoms and visits to the emergency room and hospital. Asthma medicines are one of the mainstays of asthma treatment. The drugs used to treat asthma are explained below.  Anti-inflammatories: These are the most important drugs for most people with asthma. Anti-inflammatory drugs reduce swelling and mucus production in the airways. As a result, airways are less sensitive and less likely to react to triggers. These medications need to be taken daily and may need to be taken for several weeks before they begin to control asthma. Anti-inflammatory medicines lead to fewer symptoms, better airflow, less sensitive airways, less airway damage, and fewer asthma attacks. If taken every day, they CONTROL or prevent asthma symptoms.   Bronchodilators: These drugs relax the muscle bands that tighten around the airways. This action opens the airways, letting more air in and out of the lungs and improving breathing. Bronchodilators also help clear mucus from the lungs. As the airways open, the mucus moves more freely and can be coughed out more easily. In short-acting forms, bronchodilators RELIEVE or stop asthma symptoms by quickly opening the airways and are very helpful during an asthma episode. In long-acting forms, bronchodilators provide CONTROL of asthma symptoms and prevent asthma episodes.  Asthma drugs can be taken in a variety of ways. Inhaling the medications by using a metered dose inhaler, dry powder inhaler, or nebulizer is one way of taking asthma medicines.  Oral medicines (pills or liquids you swallow) may also be prescribed.  Asthma severity Asthma is classified as either "intermittent" (comes and goes) or "persistent" (lasting). Persistent asthma is further described as being mild, moderate, or severe. The severity of asthma is based on how often you have symptoms both during the day and night, as well as by the results of lung function tests and by how well you can perform activities. The "severity" of asthma refers to how "intense" or "strong" your asthma is.  Asthma control Asthma control is the goal of asthma treatment. Regardless of your asthma severity, it may or may not be controlled. Asthma control means: You are able to do everything you want to do at work and home  You have no (or minimal) asthma symptoms  You do not wake up from your sleep or earlier than usual in the morning due to asthma  You rarely need to use your reliever medicine (inhaler)  Another major part of your treatment is that you are happy with your asthma care and believe your asthma is controlled.  Monitoring symptoms A key part of treatment is keeping track of how well your lungs are working. Monitoring your symptoms  what they are, how and when they happen, and  how severe they are  is an important part of being able to control your asthma.  Sometimes asthma is monitored using a peak flow meter. A peak flow (PF) meter measures how fast the air comes out of your lungs. It can help you know when your asthma is getting worse, sometimes even before you have symptoms. By taking daily peak flow readings, you can learn when to adjust medications to keep asthma under good control. It is also used to create your asthma action plan (see below). Your doctor can use your peak flow readings to adjust your treatment plan in some cases.  Asthma Action Plan Based on your history and asthma severity, you and your doctor will develop a care plan called an "asthma action plan." The  asthma action plan describes when and how to use your medicines, actions to take when asthma worsens, and when to seek emergency care. Make sure you understand this plan. If you do not, ask your asthma care provider any questions you may have. Your asthma action plan is one of the keys to controlling asthma. Keep it readily available to remind you of what you need to do every day to control asthma and what you need to do when symptoms occur.  Goals of asthma therapy These are the goals of asthma treatment: Live an active, normal life  Prevent chronic and troublesome symptoms  Attend work or school every day  Perform daily activities without difficulty  Stop urgent visits to the doctor, emergency department, or hospital  Use and adjust medications to control asthma with few or no side effects

## 2021-04-09 NOTE — Progress Notes (Addendum)
Austin Faulkner    [id number]    05-03-1956  Primary Care Physician:O'Sullivan, Austin Sciara, NP  Referring Physician: Apolonio Faulkner, Austin Faulkner,  [place] [address] Reason for Consultation: abnormal ct chest Date of Consultation: 04/09/2021  Chief complaint:   Chief Complaint  Patient presents with   Consult    Patient reports that he has been taking his allergy shots and makes him feel that he has an Asthma attack.      HPI:  Austin Faulkner is a 65 y.o. man who presents for new patient evaluation of an abnormal CT Chest. He does have a history of allergies on SCIT and asthma. He has seasonal allergies and has intermittent asthma and has symptoms of shortness of breath relieved by albuterol nebulizer.  He has symptoms provoked by URIs, symptoms after working outside.  Over the last couple of years he has been given steroids for an exacerbation 1-2 times/year No rugs, has an air purifier.   No childhood asthma or respiratory disease.  He has brought records from the [place] with him.  Had a CT scan done in August and June of 2022 which showed some tree in bud changes in the LLL and a 74m nodule in the RLL. These changes are now resolved but there are new changes noted with a 1.7cm GGO in the LUL.   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  Exposures: lives at home with wife, [place] no pets.  Smoking history: never smoker, had passive smoke exposure in childhood  Social History   Occupational History   Occupation: retired  Tobacco Use   Smoking status: Never    Passive exposure: Yes   Smokeless tobacco: Never  Vaping Use   Vaping Use: Never used  Substance and Sexual Activity   Alcohol use: No   Drug use: No   Sexual activity: Yes    Partners: Female    Relevant family  history:  Family History  Problem Relation Age of Onset   Lung cancer Mother    Heart attack Father 723      "healthier than I am"   Lupus Sister    Multiple myeloma Sister        Austin Faulkner   Stroke Paternal Grandmother    Alpha-1 antitrypsin deficiency Neg Hx     Past Medical History:  Diagnosis Date   Asthma    COPD (chronic obstructive pulmonary disease) ([facility]    Depression    Diverticulitis    Diverticulitis of colon with perforation 03/04/2016   Dyspnea    secondary to seasonal allergies; or illness   GERD (gastroesophageal reflux disease)    Heart murmur    slight since birth   Hypertension    Pneumonia yrs ago   [facility] [place]    Seasonal allergies    Shoulder dislocation     Past Surgical History:  Procedure Laterality Date   knee arthroscopy and cartlidge repair Bilateral    5 on right and 5 on left   SHOULDER ARTHROSCOPY Left 2008   SMALL INTESTINE SURGERY  2017   small intestine resection   TOTAL KNEE ARTHROPLASTY Bilateral      Physical Exam: Blood pressure 116/78, pulse 91, temperature 98.7 F (37.1 C), temperature source Oral,  height '5\' 10"'$  (1.778 m), weight 186 lb (84.4 kg), SpO2 98 %. Gen:      No acute distress ENT:  no nasal polyps, mucus membranes moist Lungs:    No increased respiratory effort, symmetric chest wall excursion, clear to auscultation bilaterally, no wheezes or crackles CV:         Regular rate and rhythm; no murmurs, rubs, or gallops.  No pedal edema Abd:      + bowel sounds; soft, non-tender; no distension MSK: no acute synovitis of DIP or PIP joints, no mechanics hands.  Skin:      Warm and dry; no rashes Neuro: normal speech, no focal facial asymmetry Psych: alert and oriented x3, normal mood and affect   Data Reviewed/Medical Decision Making:  Independent interpretation of tests: Imaging:  Review of patient's chest xray Jan 2021 images revealed no acute cardiopulmonary process. The patient's images have  been independently reviewed by me.    PFTs: None on file - has had them at the [place] before.   No flowsheet data found.  Labs:   Immunization status:  Immunization History  Administered Date(s) Administered   Influenza Split 02/14/2012   Influenza Whole 04/07/2007   Influenza, Seasonal, Injecte, Preservative Fre 04/06/2010, 03/30/2011   Influenza,inj,Quad PF,6+ Mos 03/09/2018, 02/23/2019   Influenza-Unspecified 03/14/2002, 06/17/2003, 03/16/2004, 04/07/2007, 01/23/2012, 05/29/2014, 03/31/2015   Moderna Covid-19 Vaccine Bivalent Booster 31yrs & up 03/31/2021   Moderna Sars-Covid-2 Vaccination 08/06/2019, 09/03/2019, 09/19/2020   PFIZER(Purple Top)SARS-COV-2 Vaccination 08/06/2019, 09/03/2019   Pneumococcal Polysaccharide-23 04/07/2007, 01/09/2020   Td 05/24/1996   Tdap 01/22/2010, 03/30/2011     I reviewed prior external note(s) from [place]  I reviewed the result(s) of the labs and imaging as noted above.   I have ordered repeat CT Chest   Assessment:  Abnormal CT Chest Asthma, mild intermittent  Plan/Recommendations:  CT scan shows resolved tree in bud opacities when compared to previous but a new LUL GGO about 1.7 cm in size. This is likely inflammatory and less likely to benign. He is a non smoker and thus a low risk patient with no personal history of cancer. Based on criteria below he can have a CT scan in 6 months which I have ordered today.   I have scanned the report from his October 2022 CT scan at the [facility] as well as submitted the CD for upload.   Continue prn albuterol for asthma   Fleischner Society Guidelines 2017 MacMahon H, [place], [place], et al. Guidelines for management of incidental pulmonary nodules detected on CT Images: From the Fleischner Society. Radiology 2017; [id number]. Copyright  2017 Radiological Society of [place].  Evaluation of the incidental solid pulmonary nodule in adults Nodule size (mm) Low (<5%) cancer risk High (>65%) or moderate (5  to 65%) cancer risk  Solitary  <6 No routine follow-up Optional CT at 12 months  6 to 8 CT at 6 to 12 months, then consider CT at 18 to 24 months CT at 6 to 12 months, then CT at 18 to 24 months  >8 CT at 3 months, then at 9 and 24 months FDG PET/CT, biopsy or resection  Multiple (evaluation based on largest nodule)  <6 No routine follow-up Optional CT at 12 months  ?6 CT at 3 to 6 months, then consider CT at 18 to 24 months CT at 3 to 6 months, then CT at 18 to 24 months   Not applicable to patients age <35 years, in lung cancer screening,  with immunosuppression, known pulmonary disease or symptoms or active primary cancer. Chest CT performed without contrast as contiguous 1 mm sections using low dose technique. Growing or FDG-avid nodules should undergo biopsy or resection. Growth is defined as >1.5 mm increase. Nodules unchanged for >2 years are benign.   CT: computed tomography; FDG: 18-fluorodeoxyglucose; PET: positron emission tomography.  Return to Care: Return in about 6 months (around 10/07/2021).  Lenice Llamas, MD Pulmonary and [place]  CC: Austin Schneiders, MD

## 2021-04-09 NOTE — Addendum Note (Signed)
Addended by: Arvilla Market on: 04/09/2021 05:37 PM   Modules accepted: Orders

## 2021-04-21 ENCOUNTER — Other Ambulatory Visit: Payer: Self-pay

## 2021-04-21 ENCOUNTER — Ambulatory Visit (INDEPENDENT_AMBULATORY_CARE_PROVIDER_SITE_OTHER): Payer: Medicare Other | Admitting: Family

## 2021-04-21 DIAGNOSIS — K4090 Unilateral inguinal hernia, without obstruction or gangrene, not specified as recurrent: Secondary | ICD-10-CM | POA: Diagnosis not present

## 2021-04-21 DIAGNOSIS — R634 Abnormal weight loss: Secondary | ICD-10-CM | POA: Insufficient documentation

## 2021-04-21 DIAGNOSIS — K4020 Bilateral inguinal hernia, without obstruction or gangrene, not specified as recurrent: Secondary | ICD-10-CM | POA: Insufficient documentation

## 2021-04-21 DIAGNOSIS — M0609 Rheumatoid arthritis without rheumatoid factor, multiple sites: Secondary | ICD-10-CM | POA: Diagnosis not present

## 2021-04-21 DIAGNOSIS — M5412 Radiculopathy, cervical region: Secondary | ICD-10-CM | POA: Insufficient documentation

## 2021-04-21 DIAGNOSIS — Z23 Encounter for immunization: Secondary | ICD-10-CM | POA: Diagnosis not present

## 2021-04-21 DIAGNOSIS — I10 Essential (primary) hypertension: Secondary | ICD-10-CM

## 2021-04-21 DIAGNOSIS — D649 Anemia, unspecified: Secondary | ICD-10-CM

## 2021-04-21 LAB — CBC WITH DIFFERENTIAL/PLATELET
Basophils Absolute: 0 10*3/uL (ref 0.0–0.1)
Basophils Relative: 0.8 % (ref 0.0–3.0)
Eosinophils Absolute: 0 10*3/uL (ref 0.0–0.7)
Eosinophils Relative: 0.3 % (ref 0.0–5.0)
HCT: 41.2 % (ref 39.0–52.0)
Hemoglobin: 13.5 g/dL (ref 13.0–17.0)
Lymphocytes Relative: 33.1 % (ref 12.0–46.0)
Lymphs Abs: 1.4 10*3/uL (ref 0.7–4.0)
MCHC: 32.7 g/dL (ref 30.0–36.0)
MCV: 94 fl (ref 78.0–100.0)
Monocytes Absolute: 0.6 10*3/uL (ref 0.1–1.0)
Monocytes Relative: 14.4 % — ABNORMAL HIGH (ref 3.0–12.0)
Neutro Abs: 2.1 10*3/uL (ref 1.4–7.7)
Neutrophils Relative %: 51.4 % (ref 43.0–77.0)
Platelets: 306 10*3/uL (ref 150.0–400.0)
RBC: 4.38 Mil/uL (ref 4.22–5.81)
RDW: 13.1 % (ref 11.5–15.5)
WBC: 4.2 10*3/uL (ref 4.0–10.5)

## 2021-04-21 LAB — COMPREHENSIVE METABOLIC PANEL
ALT: 18 U/L (ref 0–53)
AST: 17 U/L (ref 0–37)
Albumin: 4.3 g/dL (ref 3.5–5.2)
Alkaline Phosphatase: 41 U/L (ref 39–117)
BUN: 17 mg/dL (ref 6–23)
CO2: 30 mEq/L (ref 19–32)
Calcium: 9 mg/dL (ref 8.4–10.5)
Chloride: 101 mEq/L (ref 96–112)
Creatinine, Ser: 1.04 mg/dL (ref 0.40–1.50)
GFR: 76.66 mL/min (ref >60.00)
Glucose, Bld: 87 mg/dL (ref 70–99)
Potassium: 3.9 mEq/L (ref 3.5–5.1)
Sodium: 138 mEq/L (ref 135–145)
Total Bilirubin: 0.5 mg/dL (ref 0.2–1.2)
Total Protein: 6.7 g/dL (ref 6.0–8.3)

## 2021-04-21 NOTE — Assessment & Plan Note (Signed)
Will refer to surgery for evaluation. Discussed red flags that should prompt him to go to the ED.

## 2021-04-21 NOTE — Assessment & Plan Note (Signed)
States that he has an upcoming ESI scheduled as well as follow up with neurosurgery.

## 2021-04-21 NOTE — Assessment & Plan Note (Signed)
Stable. Management per rheumatology.  

## 2021-04-21 NOTE — Assessment & Plan Note (Addendum)
BP Readings from Last 3 Encounters:  04/09/21 116/78  01/09/20 120/75  06/22/19 136/77   Blood pressure stable on hctz 25mg  once daily. Continue same.

## 2021-04-21 NOTE — Patient Instructions (Signed)
Please visit the lab before leaving today.  

## 2021-04-21 NOTE — Assessment & Plan Note (Signed)
Wt Readings from Last 3 Encounters:  04/09/21 186 lb (84.4 kg)  01/09/20 192 lb (87.1 kg)  06/22/19 205 lb (93 kg)   Weight has stabilized following discontinuation of arava.  Monitor.

## 2021-04-21 NOTE — Progress Notes (Addendum)
Subjective:   By signing my name below, I, Austin Faulkner, attest that this documentation has been prepared under the direction and in the presence of Austin Alar, NP, 04/21/2021   Patient ID: Austin Faulkner, male    DOB: February 05, 1956, 65 y.o.   MRN: [id number]  Chief Complaint  Patient presents with   Groin Pain    Was advised he has a inguinal hernia on right side, seen on previous pelvis CT 2017.    HPI Patient is in today for an office visit. He mentions he has been regularly going to the [place] for medical care.   Pulmonologist: He has been seeing a pulmonologist who has been taking care of the concerns on his previous CT scans. Allergies: He notes that he has been seeing an allergist for his increasingly worsening allergies. He notes that he has been having 2 allergy shots every week and will continue to do this for the next 5 years.  Hernia: He notes that in 2017 another provider discovered that he had an anginal hernia. He reports that about 2 weeks ago he was in the shower he discovered a lump in his stomach. He denied any pain at that time but he does note that he felt it about 3 days ago and this time it was more swollen and was tender. He denies any interruptions to his bowel movements.   Rheumatoid arthritis: He has been dealing with rheumatoid arthritis for many years. He has been seeing a rheumatologist who put him on 20 mg arava which helped with his pain but made him lose a lot of weight rapidly. He mentions that it completely took away his pain but he stopped taking it because he was losing weight too fast and unexpectedly.  Chronic pain: He has been experiencing numbness and tingling in his fingers and hands. He is being treated by another provider for this concern.  Immunizations: He is interested in getting his flu shot in the office today.   Health Maintenance Due  Topic Date Due   HIV Screening  Never done   Hepatitis C Screening  Never done   Zoster  Vaccines- Shingrix (1 of 2) Never done   INFLUENZA VACCINE  12/22/2020   Pneumococcal Vaccine 40-35 Years old (3 - PCV) 01/08/2021   TETANUS/TDAP  03/29/2021    Past Medical History:  Diagnosis Date   Asthma    COPD (chronic obstructive pulmonary disease) (HCC)    Depression    Diverticulitis    Diverticulitis of colon with perforation 03/04/2016   Dyspnea    secondary to seasonal allergies; or illness   GERD (gastroesophageal reflux disease)    Heart murmur    slight since birth   Hypertension    Pneumonia yrs ago   Rheumatoid arthritis ([place])    Seasonal allergies    Shoulder dislocation     Past Surgical History:  Procedure Laterality Date   knee arthroscopy and cartlidge repair Bilateral    5 on right and 5 on left   SHOULDER ARTHROSCOPY Left 2008   SMALL INTESTINE SURGERY  2017   small intestine resection   TOTAL KNEE ARTHROPLASTY Bilateral     Family History  Problem Relation Age of Onset   Lung cancer Mother    Heart attack Father 49       "healthier than I am"   Lupus Sister    Multiple myeloma Sister        BMT at [place]   Stroke Paternal Grandmother  Alpha-1 antitrypsin deficiency Neg Hx     Social History   Socioeconomic History   Marital status: Married    Spouse name: Austin Faulkner   Number of children: 3   Years of education: Not on file   Highest education level: Not on file  Occupational History   Occupation: retired  Tobacco Use   Smoking status: Never    Passive exposure: Yes   Smokeless tobacco: Never  Vaping Use   Vaping Use: Never used  Substance and Sexual Activity   Alcohol use: No   Drug use: No   Sexual activity: Yes    Partners: Female  Other Topics Concern   Not on file  Social History Narrative   Not on file   Social Determinants of Health   Financial Resource Strain: Not on file  Food Insecurity: Not on file  Transportation Needs: Not on file  Physical Activity: Not on file  Stress: Not on file  Social Connections:  Not on file  Intimate Partner Violence: Not on file    Outpatient Medications Prior to Visit  Medication Sig Dispense Refill   acetaminophen (TYLENOL) 325 MG tablet Take 1-2 tablets (325-650 mg total) by mouth every 6 (six) hours as needed for fever, headache, mild pain or moderate pain.     albuterol (VENTOLIN HFA) 108 (90 Base) MCG/ACT inhaler Inhale 2 puffs into the lungs every 6 (six) hours as needed for wheezing or shortness of breath.      Ascorbic Acid (VITAMIN C) 1000 MG tablet Take 1,000 mg by mouth daily.     budesonide-formoterol (SYMBICORT) 160-4.5 MCG/ACT inhaler Inhale 2 puffs into the lungs 2 (two) times daily.      cetirizine (ZYRTEC) 10 MG tablet Take 1 tablet (10 mg total) by mouth daily. 30 tablet 11   EPINEPHrine 0.3 mg/0.3 mL IJ SOAJ injection Inject into the muscle.     famotidine (PEPCID) 20 MG tablet Take 1 tablet (20 mg total) by mouth 2 (two) times daily.     fluticasone (FLONASE) 50 MCG/ACT nasal spray Place 1-2 sprays into both nostrils daily as needed for allergies or rhinitis.     hydrochlorothiazide (HYDRODIURIL) 25 MG tablet Take 25 mg by mouth daily.     hydroxychloroquine (PLAQUENIL) 200 MG tablet Take 200 mg by mouth 2 (two) times daily.     ipratropium-albuterol (DUONEB) 0.5-2.5 (3) MG/3ML SOLN Take 3 mLs by nebulization every 6 (six) hours as needed. 360 mL    Multiple Vitamin (MULTIVITAMIN WITH MINERALS) TABS tablet Take 1 tablet by mouth daily.     Naphazoline-Pheniramine (NAPHCON-A OP) Apply 1 drop to eye daily as needed (allergies).     omeprazole (PRILOSEC) 20 MG capsule Take 20 mg by mouth daily.      PARoxetine (PAXIL) 20 MG tablet Take 20 mg by mouth daily.     tadalafil (CIALIS) 20 MG tablet      tamsulosin (FLOMAX) 0.4 MG CAPS capsule Take 0.4 mg by mouth.     Tiotropium Bromide Monohydrate (SPIRIVA RESPIMAT) 1.25 MCG/ACT AERS Inhale 2 puffs into the lungs 2 (two) times daily.     traZODone (DESYREL) 100 MG tablet Take 100 mg by mouth at bedtime.      TURMERIC CURCUMIN PO Take 900 mg by mouth daily.      leflunomide (ARAVA) 20 MG tablet Take 20 mg by mouth daily.     No facility-administered medications prior to visit.    Allergies  Allergen Reactions   Azithromycin Other (See Comments)  Patient reports does not help with infections.    Sildenafil Other (See Comments)    Other reaction(s): Headache, Visual disturbance, Other (See Comments) Viagra, doesn't help     Review of Systems  HENT:         (+) seasonal allergies   Gastrointestinal:        (+) abdominal hernia  Musculoskeletal:  Positive for joint pain (rheumatoid arthritis).  Psychiatric/Behavioral:  Positive for depression. The patient is nervous/anxious.        (+) post traumatic stress disorder      Objective:    Physical Exam Constitutional:      General: He is not in acute distress.    Appearance: Normal appearance. He is not ill-appearing.  HENT:     Head: Normocephalic and atraumatic.     Right Ear: External ear normal.     Left Ear: External ear normal.  Eyes:     Extraocular Movements: Extraocular movements intact.     Pupils: Pupils are equal, round, and reactive to light.  Cardiovascular:     Rate and Rhythm: Normal rate and regular rhythm.     Heart sounds: Normal heart sounds. No murmur heard.   No gallop.  Pulmonary:     Effort: Pulmonary effort is normal. No respiratory distress.     Breath sounds: Normal breath sounds. No wheezing or rales.  Abdominal:     Hernia: A hernia (right inguinal hernia partially reduceable) is present.  Skin:    General: Skin is warm and dry.  Neurological:     Mental Status: He is alert and oriented to person, place, and time.  Psychiatric:        Behavior: Behavior normal.        Judgment: Judgment normal.    There were no vitals taken for this visit. Wt Readings from Last 3 Encounters:  04/09/21 186 lb (84.4 kg)  01/09/20 192 lb (87.1 kg)  06/22/19 205 lb (93 kg)       Assessment & Plan:    Problem List Items Addressed This Visit       Unprioritized   Weight loss    Wt Readings from Last 3 Encounters:  04/09/21 186 lb (84.4 kg)  01/09/20 192 lb (87.1 kg)  06/22/19 205 lb (93 kg)  Weight has stabilized following discontinuation of arava.  Monitor.       Right inguinal hernia - Primary    Will refer to surgery for evaluation. Discussed red flags that should prompt him to go to the ED.       Relevant Orders   Ambulatory referral to General Surgery   Rheumatoid arthritis of multiple sites without rheumatoid factor (HCC)    Stable. Management per rheumatology.       Essential hypertension    BP Readings from Last 3 Encounters:  04/09/21 116/78  01/09/20 120/75  06/22/19 136/77  Blood pressure stable on hctz 90m once daily. Continue same.       Relevant Orders   Comp Met (CMET)   Cervical radiculopathy    States that he has an upcoming ESI scheduled as well as follow up with neurosurgery.       Other Visit Diagnoses     Needs flu shot       Relevant Orders   Flu Vaccine QUAD 6+ mos PF IM (Fluarix Quad PF)   Anemia, unspecified type       Relevant Orders   CBC with Differential/Platelet      No  orders of the defined types were placed in this encounter.   I, Austin Alar, NP, personally preformed the services described in this documentation.  All medical record entries made by the scribe were at my direction and in my presence.  I have reviewed the chart and discharge instructions (if applicable) and agree that the record reflects my personal performance and is accurate and complete. 04/21/2021  I,Austin Faulkner,acting as a Education administrator for Nance Pear, NP.,have documented all relevant documentation on the behalf of Nance Pear, NP,as directed by  Nance Pear, NP while in the presence of Nance Pear, NP.  Nance Pear, NP  41 minutes spent on today's visit. The majority of this time was spent  reviewing his chart and interviewing patient.   Flu shot today.

## 2021-05-06 ENCOUNTER — Telehealth: Payer: Self-pay | Admitting: Internal Medicine

## 2021-05-06 NOTE — Telephone Encounter (Signed)
No extension was give for [facility]. I have printed the last OV and will fax to the number provided.   Nothing further needed.

## 2021-06-04 ENCOUNTER — Telehealth: Payer: Self-pay | Admitting: Internal Medicine

## 2021-06-04 NOTE — Telephone Encounter (Signed)
ATC LVM for Adriane to call office back x1

## 2021-06-11 NOTE — Telephone Encounter (Signed)
Ov note was faxed to the number provided by the [facility]  Will close encounter

## 2021-06-23 ENCOUNTER — Ambulatory Visit: Payer: Self-pay | Admitting: General Surgery

## 2021-07-09 NOTE — Patient Instructions (Signed)
DUE TO COVID-19 ONLY ONE VISITOR IS ALLOWED TO COME WITH YOU AND STAY IN THE WAITING ROOM ONLY DURING PRE OP AND PROCEDURE DAY OF SURGERY IF YOU ARE GOING HOME AFTER SURGERY. IF YOU ARE SPENDING THE NIGHT 2 PEOPLE MAY VISIT WITH YOU IN YOUR PRIVATE ROOM AFTER SURGERY UNTIL VISITING  HOURS ARE OVER AT 800 PM AND 1  VISITOR  MAY  SPEND THE NIGHT.                 Austin Faulkner     Your procedure is scheduled on: 07/23/21   Report to [facility] Main  Entrance   Report to admitting at   8:45 AM     Call this number if you have problems the morning of surgery [phone]    No food after midnight.    You may have clear liquid until 8:00 AM.    At 7:30 AM drink pre surgery drink.   Nothing by mouth after 8:00 AM.   CLEAR LIQUID DIET   Foods Allowed                                                                     Foods Excluded  Coffee and tea, regular and decaf                             liquids that you cannot  Plain Jell-O any favor except red or purple                                           see through such as: Fruit ices (not with fruit pulp)                                     milk, soups, orange juice  Iced Popsicles                                    All solid food Carbonated beverages, regular and diet                                    Cranberry, grape and apple juices Sports drinks like Gatorade Lightly seasoned clear broth or consume(fat free) Sugar     BRUSH YOUR TEETH MORNING OF SURGERY AND RINSE YOUR MOUTH OUT, NO CHEWING GUM CANDY OR MINTS.     Take these medicines the morning of surgery with A SIP OF WATER: Paxil, Tamsulosin, Omeprazole                    Use your inhalers and bring them with you                                You may not have any metal on your body including  piercings  Do not wear jewelry, lotions, powders or  deodorant             Men may shave face and neck.   Do not bring valuables to the hospital. [place].  Contacts, dentures or bridgework may not be worn into surgery.     Patients discharged the day of surgery will not be allowed to drive home.  IF YOU ARE HAVING SURGERY AND GOING HOME THE SAME DAY, YOU MUST HAVE AN ADULT TO DRIVE YOU HOME AND BE WITH YOU FOR 24 HOURS. YOU MAY GO HOME BY TAXI OR UBER OR ORTHERWISE, BUT AN ADULT MUST ACCOMPANY YOU HOME AND STAY WITH YOU FOR 24 HOURS.  Name and phone number of your driver:  Special Instructions: N/A              Please read over the following fact sheets you were given: _____________________________________________________________________             [facility] - Preparing for Surgery Before surgery, you can play an important role.  Because skin is not sterile, your skin needs to be as free of germs as possible.  You can reduce the number of germs on your skin by washing with CHG (chlorahexidine gluconate) soap before surgery.  CHG is an antiseptic cleaner which kills germs and bonds with the skin to continue killing germs even after washing. Please DO NOT use if you have an allergy to CHG or antibacterial soaps.  If your skin becomes reddened/irritated stop using the CHG and inform your nurse when you arrive at Short Stay.  You may shave your face/neck. Please follow these instructions carefully:  1.  Shower with CHG Soap the night before surgery and the  morning of Surgery.  2.  If you choose to wash your hair, wash your hair first as usual with your  normal  shampoo.  3.  After you shampoo, rinse your hair and body thoroughly to remove the  shampoo.                            4.  Use CHG as you would any other liquid soap.  You can apply chg directly  to the skin and wash                       Gently with a scrungie or clean washcloth.  5.  Apply the CHG Soap to your body ONLY FROM THE NECK DOWN.   Do not use on face/ open                           Wound or open sores. Avoid contact  with eyes, ears mouth and genitals (private parts).                       Wash face,  Genitals (private parts) with your normal soap.             6.  Wash thoroughly, paying special attention to the area where your surgery  will be performed.  7.  Thoroughly rinse your body with warm water from the neck down.  8.  DO NOT shower/wash with your normal soap after using and rinsing off  the CHG Soap.  9.  Pat yourself dry with a clean towel.            10.  Wear clean pajamas.            11.  Place clean sheets on your bed the night of your first shower and do not  sleep with pets. Day of Surgery : Do not apply any lotions/deodorants the morning of surgery.  Please wear clean clothes to the hospital/surgery center.  FAILURE TO FOLLOW THESE INSTRUCTIONS MAY RESULT IN THE CANCELLATION OF YOUR SURGERY PATIENT SIGNATURE_________________________________  NURSE SIGNATURE__________________________________  ________________________________________________________________________

## 2021-07-13 ENCOUNTER — Other Ambulatory Visit: Payer: Self-pay

## 2021-07-13 ENCOUNTER — Encounter (HOSPITAL_COMMUNITY)
Admission: RE | Admit: 2021-07-13 | Discharge: 2021-07-13 | Disposition: A | Discharge status: Home / Self Care | Payer: Medicare Other | Source: Ambulatory Visit | Attending: General Surgery | Admitting: General Surgery

## 2021-07-13 ENCOUNTER — Encounter (HOSPITAL_COMMUNITY): Payer: Self-pay

## 2021-07-13 VITALS — BP 162/92 | HR 65 | Temp 98.1°F | Resp 18 | Ht 70.0 in | Wt 184.0 lb

## 2021-07-13 DIAGNOSIS — Z01818 Encounter for other preprocedural examination: Secondary | ICD-10-CM | POA: Diagnosis not present

## 2021-07-13 DIAGNOSIS — I10 Essential (primary) hypertension: Secondary | ICD-10-CM | POA: Diagnosis not present

## 2021-07-13 LAB — BASIC METABOLIC PANEL
Anion gap: 5 (ref 5–15)
BUN: 16 mg/dL (ref 8–23)
CO2: 29 mmol/L (ref 22–32)
Calcium: 8.9 mg/dL (ref 8.9–10.3)
Chloride: 105 mmol/L (ref 98–111)
Creatinine, Ser: 0.93 mg/dL (ref 0.61–1.24)
GFR, Estimated: >60 mL/min (ref >60)
Glucose, Bld: 89 mg/dL (ref 70–99)
Potassium: 4 mmol/L (ref 3.5–5.1)
Sodium: 139 mmol/L (ref 135–145)

## 2021-07-13 LAB — CBC
HCT: 39.8 % (ref 39.0–52.0)
Hemoglobin: 12.8 g/dL — ABNORMAL LOW (ref 13.0–17.0)
MCH: 30.9 pg (ref 26.0–34.0)
MCHC: 32.2 g/dL (ref 30.0–36.0)
MCV: 96.1 fL (ref 80.0–100.0)
Platelets: 338 10*3/uL (ref 150–400)
RBC: 4.14 MIL/uL — ABNORMAL LOW (ref 4.22–5.81)
RDW: 13.5 % (ref 11.5–15.5)
WBC: 5.5 10*3/uL (ref 4.0–10.5)
nRBC: 0 % (ref 0.0–0.2)

## 2021-07-13 NOTE — Progress Notes (Signed)
COVID test- NA  Bowel prep reminder:NA  PCP - Sandford Craze Cardiologist [facility]  Chest x-ray - no EKG - 07/13/21-chart Stress Test - no ECHO - at the [place] years ago Cardiac Cath - no Pacemaker/ICD device last checked:NA  Sleep Study - no CPAP -   Fasting Blood Sugar - NA Checks Blood Sugar _____ times a day  Blood Thinner Instructions:NA Aspirin Instructions: Last Dose:  Anesthesia review: yes  Patient denies shortness of breath, fever, cough and chest pain at PAT appointment Pt has no SOB with any activities. He has RA and a note on the chart to continue all of his RA medications. I told him to verify with the surgeon and that it is fine with anesthesia. He reports a very hard to hear heart murmur that has never given him a problem with any of his surgeries. He had an ECHO done at the [place] years ago.  Patient verbalized understanding of instructions that were given to them at the PAT appointment. Patient was also instructed that they will need to review over the PAT instructions again at home before surgery. yes

## 2021-07-15 ENCOUNTER — Ambulatory Visit (HOSPITAL_BASED_OUTPATIENT_CLINIC_OR_DEPARTMENT_OTHER)
Admission: RE | Admit: 2021-07-15 | Discharge: 2021-07-15 | Disposition: A | Discharge status: Home / Self Care | Payer: Medicare Other | Source: Ambulatory Visit | Attending: Family | Admitting: Family

## 2021-07-15 ENCOUNTER — Ambulatory Visit (INDEPENDENT_AMBULATORY_CARE_PROVIDER_SITE_OTHER): Payer: Medicare Other | Admitting: Family

## 2021-07-15 ENCOUNTER — Other Ambulatory Visit: Payer: Self-pay

## 2021-07-15 VITALS — BP 140/82 | HR 62 | Temp 98.3°F | Resp 16 | Ht 70.0 in | Wt 183.0 lb

## 2021-07-15 DIAGNOSIS — D649 Anemia, unspecified: Secondary | ICD-10-CM

## 2021-07-15 DIAGNOSIS — M1990 Unspecified osteoarthritis, unspecified site: Secondary | ICD-10-CM

## 2021-07-15 DIAGNOSIS — K4020 Bilateral inguinal hernia, without obstruction or gangrene, not specified as recurrent: Secondary | ICD-10-CM | POA: Diagnosis not present

## 2021-07-15 DIAGNOSIS — Z01818 Encounter for other preprocedural examination: Secondary | ICD-10-CM | POA: Insufficient documentation

## 2021-07-15 DIAGNOSIS — M1610 Unilateral primary osteoarthritis, unspecified hip: Secondary | ICD-10-CM

## 2021-07-15 LAB — HEPATIC FUNCTION PANEL
ALT: 17 U/L (ref 0–53)
AST: 18 U/L (ref 0–37)
Albumin: 4.4 g/dL (ref 3.5–5.2)
Alkaline Phosphatase: 46 U/L (ref 39–117)
Bilirubin, Direct: 0.1 mg/dL (ref 0.0–0.3)
Total Bilirubin: 0.6 mg/dL (ref 0.2–1.2)
Total Protein: 6.7 g/dL (ref 6.0–8.3)

## 2021-07-15 LAB — PROTIME-INR
INR: 1 ratio (ref 0.8–1.0)
Prothrombin Time: 11.1 s (ref 9.6–13.1)

## 2021-07-15 NOTE — Progress Notes (Addendum)
Subjective:   By signing my name below, I, Shehryar Baig, attest that this documentation has been prepared under the direction and in the presence of [name], NP 07/15/2021     Patient ID: Austin Faulkner, male    DOB: 02/13/1956, 66 y.o.   MRN: [id number]  Chief Complaint  Patient presents with   Pre-op Exam    Here for evaluation prior to hip replacement    HPI Patient is in today for a Pre-op.   Hernia repair- He has an upcomming hernia repair scheduled in March, 2023. He has seen his  He is not on any aspirin or blood thinner. He is planning on stopping meloxicam prior to his procedure. He denies cough or cold symptoms, chest pain, leg swelling, stomach concerns, N/V/D, burning, frequency. Hip replacement- He is having a left hip replacement procedure in early April.  He has a pre-op appointment for this procedure on 08/07/2021.  Pulmonology- He has an upcomming CT scan with his pulmonologist in May, 2023.  Weight- He has lost weight since his last visit. He attribute to loss of taste. His is actively trying to eat 3 meals a day.  Wt Readings from Last 3 Encounters:  07/15/21 183 lb (83 kg)  07/13/21 184 lb (83.5 kg)  04/09/21 186 lb (84.4 kg)    Health Maintenance Due  Topic Date Due   HIV Screening  Never done   Hepatitis C Screening  Never done   Zoster Vaccines- Shingrix (1 of 2) Never done   TETANUS/TDAP  03/29/2021    Past Medical History:  Diagnosis Date   Asthma    COPD (chronic obstructive pulmonary disease) ([place])    Depression    PTSD   Diverticulitis    Diverticulitis of colon with perforation 03/04/2016   Dyspnea    secondary to seasonal allergies; or illness   GERD (gastroesophageal reflux disease)    Heart murmur    slight since birth   Hypertension    Pneumonia yrs ago   Rheumatoid arthritis ([place])    Seasonal allergies    Shoulder dislocation     Past Surgical History:  Procedure Laterality Date   knee arthroscopy and  cartlidge repair Bilateral    5 on right and 5 on left   SHOULDER ARTHROSCOPY Left 2008   SMALL INTESTINE SURGERY  2017   small intestine resection   TOTAL KNEE ARTHROPLASTY Bilateral 2002   2010    Family History  Problem Relation Age of Onset   Lung cancer Mother    Heart attack Father 18       "healthier than I am"   Lupus Sister    Multiple myeloma Sister        BMT at [facility]   Stroke Paternal Grandmother    Alpha-1 antitrypsin deficiency Neg Hx     Social History   Socioeconomic History   Marital status: Married    Spouse name: Silva Bandy   Number of children: 3   Years of education: Not on file   Highest education level: Not on file  Occupational History   Occupation: retired  Tobacco Use   Smoking status: Former    Types: Cigarettes, Cigars    Quit date: 2019    Years since quitting: 4.1    Passive exposure: Yes   Smokeless tobacco: Never  Vaping Use   Vaping Use: Never used  Substance and Sexual Activity   Alcohol use: No   Drug use: No   Sexual  activity: Yes    Partners: Female  Other Topics Concern   Not on file  Social History Narrative   Not on file   Social Determinants of Health   Financial Resource Strain: Not on file  Food Insecurity: Not on file  Transportation Needs: Not on file  Physical Activity: Not on file  Stress: Not on file  Social Connections: Not on file  Intimate Partner Violence: Not on file    Outpatient Medications Prior to Visit  Medication Sig Dispense Refill   acetaminophen (TYLENOL) 650 MG CR tablet Take 1,300 mg by mouth every 8 (eight) hours as needed for pain.     albuterol (VENTOLIN HFA) 108 (90 Base) MCG/ACT inhaler Inhale 2 puffs into the lungs every 6 (six) hours as needed for wheezing or shortness of breath.      Ascorbic Acid (VITAMIN C) 1000 MG tablet Take 1,000 mg by mouth daily.     cefUROXime (CEFTIN) 250 MG tablet Take 250 mg by mouth daily as needed (Take 250 mg twice a day as need after breathing  treatment).     cetirizine (ZYRTEC) 10 MG tablet Take 1 tablet (10 mg total) by mouth daily. 30 tablet 11   EPINEPHrine 0.3 mg/0.3 mL IJ SOAJ injection Inject 0.3 mg into the muscle as needed for anaphylaxis.     fluocinonide ointment (LIDEX) 6.16 % Apply 1 application topically daily as needed (Eczema).     fluticasone (FLONASE) 50 MCG/ACT nasal spray Place 1-2 sprays into both nostrils daily as needed for allergies or rhinitis.     fluticasone-salmeterol (ADVAIR HFA) 115-21 MCG/ACT inhaler Inhale 2 puffs into the lungs 2 (two) times daily.     hydrochlorothiazide (HYDRODIURIL) 25 MG tablet Take 25 mg by mouth daily.     hydroxychloroquine (PLAQUENIL) 200 MG tablet Take 200 mg by mouth 2 (two) times daily.     ipratropium-albuterol (DUONEB) 0.5-2.5 (3) MG/3ML SOLN Take 3 mLs by nebulization every 6 (six) hours as needed. 360 mL    ketotifen (ZADITOR) 0.025 % ophthalmic solution Place 1 drop into both eyes daily as needed for allergies.     leflunomide (ARAVA) 20 MG tablet Take 20 mg by mouth at bedtime.     meloxicam (MOBIC) 15 MG tablet Take 15 mg by mouth daily.     methylPREDNISolone (MEDROL DOSEPAK) 4 MG TBPK tablet Take 4 mg by mouth as needed (Breathing flair-up).     Multiple Vitamin (MULTIVITAMIN WITH MINERALS) TABS tablet Take 1 tablet by mouth daily.     omeprazole (PRILOSEC) 20 MG capsule Take 20 mg by mouth daily before breakfast.     PARoxetine (PAXIL) 40 MG tablet Take 20 mg by mouth at bedtime.     PRESCRIPTION MEDICATION 2 (two) times a week. Allergy injection     Probiotic Product (PROBIOTIC DAILY PO) Take 1 capsule by mouth daily before breakfast. 100 billion     sulfaSALAzine (AZULFIDINE) 500 MG tablet Take 500 mg by mouth 2 (two) times daily.     tadalafil (CIALIS) 20 MG tablet Take 20 mg by mouth daily as needed for erectile dysfunction.     tamsulosin (FLOMAX) 0.4 MG CAPS capsule Take 0.4 mg by mouth at bedtime.     Tiotropium Bromide Monohydrate (SPIRIVA RESPIMAT) 1.25  MCG/ACT AERS Inhale 2 puffs into the lungs 2 (two) times daily.     traZODone (DESYREL) 100 MG tablet Take 100 mg by mouth at bedtime.     TURMERIC CURCUMIN PO Take 900 mg by  mouth daily before breakfast.     No facility-administered medications prior to visit.    Allergies  Allergen Reactions   Tape Other (See Comments)    Must use paper tape   Azithromycin Other (See Comments)    Patient reports does not help with infections.    Sildenafil Other (See Comments)    Other reaction(s): Headache, Visual disturbance, Other (See Comments) Viagra, doesn't help  Other reaction(s): Headache, Pain in eye    Review of Systems  HENT:  Negative for congestion.   Respiratory:  Negative for cough and shortness of breath.   Cardiovascular:  Negative for chest pain and leg swelling.  Gastrointestinal:  Negative for abdominal pain, diarrhea, nausea and vomiting.  Genitourinary:  Negative for dysuria and frequency.      Objective:    Physical Exam Constitutional:      General: He is not in acute distress.    Appearance: Normal appearance. He is not ill-appearing.  HENT:     Head: Normocephalic and atraumatic.     Right Ear: External ear normal.     Left Ear: External ear normal.  Eyes:     Extraocular Movements: Extraocular movements intact.     Pupils: Pupils are equal, round, and reactive to light.  Cardiovascular:     Rate and Rhythm: Normal rate and regular rhythm.     Heart sounds: Normal heart sounds. No murmur heard.   No gallop.  Pulmonary:     Effort: Pulmonary effort is normal. No respiratory distress.     Breath sounds: Normal breath sounds. No wheezing or rales.  Abdominal:     General: There is no distension.     Palpations: Abdomen is soft.     Tenderness: There is no abdominal tenderness. There is no guarding.  Lymphadenopathy:     Cervical: No cervical adenopathy.  Skin:    General: Skin is warm and dry.  Neurological:     Mental Status: He is alert and  oriented to person, place, and time.  Psychiatric:        Behavior: Behavior normal.    BP 140/82 (BP Location: Right Arm, Patient Position: Sitting, Cuff Size: Small)    Pulse 62    Temp 98.3 F (36.8 C) (Oral)    Resp 16    Ht 5' 10" (1.778 m)    Wt 183 lb (83 kg)    SpO2 100%    BMI 26.26 kg/m  Wt Readings from Last 3 Encounters:  07/15/21 183 lb (83 kg)  07/13/21 184 lb (83.5 kg)  04/09/21 186 lb (84.4 kg)       Assessment & Plan:   Problem List Items Addressed This Visit       Unprioritized   Preoperative examination - Primary    EKG tracing is personally reviewed.  EKG notes NSR.  No acute changes. CXR is clear.  Lab work reviewed and stable.  Pt is acceptable risk to proceed with planned bilateral hernia repair. He will follow up with me post-operatively for re-evaluation prior to his planned hip replacement surgery in April.       Relevant Orders   Hepatic function panel (Completed)   DG Chest 2 View (Completed)   Bilateral inguinal hernia without obstruction or gangrene    Reports that these hernias cause him pain and he is looking forward to having them repaired.       Anemia    Lab Results  Component Value Date   WBC 5.5 07/13/2021  HGB 12.8 (L) 07/13/2021   HCT 39.8 07/13/2021   MCV 96.1 07/13/2021   PLT 338 07/13/2021  Mild. Obtain PT/INR.      Relevant Orders   Protime-INR (Completed)     No orders of the defined types were placed in this encounter.   Cordella Register, NP, personally preformed the services described in this documentation.  All medical record entries made by the scribe were at my direction and in my presence.  I have reviewed the chart and discharge instructions (if applicable) and agree that the record reflects my personal performance and is accurate and complete. 07/15/2021   I,Shehryar Baig,acting as a scribe for Nance Pear, NP.,have documented all relevant documentation on the behalf of Nance Pear,  NP,as directed by  Nance Pear, NP while in the presence of Nance Pear, NP.   Nance Pear, NP

## 2021-07-17 DIAGNOSIS — Z01818 Encounter for other preprocedural examination: Secondary | ICD-10-CM | POA: Insufficient documentation

## 2021-07-17 DIAGNOSIS — D649 Anemia, unspecified: Secondary | ICD-10-CM | POA: Insufficient documentation

## 2021-07-17 NOTE — Assessment & Plan Note (Signed)
EKG tracing is personally reviewed.  EKG notes NSR.  No acute changes. CXR is clear.  Lab work reviewed and stable.  Pt is acceptable risk to proceed with planned bilateral hernia repair. He will follow up with me post-operatively for re-evaluation prior to his planned hip replacement surgery in April.

## 2021-07-17 NOTE — Assessment & Plan Note (Signed)
Lab Results  Component Value Date   WBC 5.5 07/13/2021   HGB 12.8 (L) 07/13/2021   HCT 39.8 07/13/2021   MCV 96.1 07/13/2021   PLT 338 07/13/2021   Mild. Obtain PT/INR.

## 2021-07-17 NOTE — Assessment & Plan Note (Signed)
Reports that these hernias cause him pain and he is looking forward to having them repaired.

## 2021-07-21 ENCOUNTER — Ambulatory Visit: Payer: No Typology Code available for payment source | Admitting: Family

## 2021-07-23 ENCOUNTER — Ambulatory Visit (HOSPITAL_COMMUNITY): Payer: Medicare Other | Admitting: Physician Assistant

## 2021-07-23 ENCOUNTER — Ambulatory Visit (HOSPITAL_COMMUNITY)
Admission: RE | Admit: 2021-07-23 | Discharge: 2021-07-23 | Disposition: A | Discharge status: Home / Self Care | Payer: Medicare Other | Attending: General Surgery | Admitting: General Surgery

## 2021-07-23 ENCOUNTER — Other Ambulatory Visit: Payer: Self-pay

## 2021-07-23 ENCOUNTER — Encounter (HOSPITAL_COMMUNITY): Payer: Self-pay | Admitting: General Surgery

## 2021-07-23 ENCOUNTER — Encounter (HOSPITAL_COMMUNITY)
Admission: RE | Disposition: A | Discharge status: Home / Self Care | Payer: Self-pay | Source: Home / Self Care | Attending: General Surgery

## 2021-07-23 ENCOUNTER — Ambulatory Visit (HOSPITAL_BASED_OUTPATIENT_CLINIC_OR_DEPARTMENT_OTHER): Payer: Medicare Other | Admitting: Certified Registered Nurse Anesthetist

## 2021-07-23 DIAGNOSIS — K4020 Bilateral inguinal hernia, without obstruction or gangrene, not specified as recurrent: Secondary | ICD-10-CM | POA: Insufficient documentation

## 2021-07-23 DIAGNOSIS — D649 Anemia, unspecified: Secondary | ICD-10-CM | POA: Diagnosis not present

## 2021-07-23 DIAGNOSIS — J449 Chronic obstructive pulmonary disease, unspecified: Secondary | ICD-10-CM

## 2021-07-23 DIAGNOSIS — F4310 Post-traumatic stress disorder, unspecified: Secondary | ICD-10-CM

## 2021-07-23 DIAGNOSIS — I10 Essential (primary) hypertension: Secondary | ICD-10-CM | POA: Diagnosis not present

## 2021-07-23 DIAGNOSIS — F32A Depression, unspecified: Secondary | ICD-10-CM | POA: Diagnosis not present

## 2021-07-23 DIAGNOSIS — D759 Disease of blood and blood-forming organs, unspecified: Secondary | ICD-10-CM | POA: Diagnosis not present

## 2021-07-23 HISTORY — PX: INGUINAL HERNIA REPAIR: SHX194

## 2021-07-23 SURGERY — REPAIR, HERNIA, INGUINAL, BILATERAL, LAPAROSCOPIC
Anesthesia: General | Laterality: Bilateral

## 2021-07-23 MED ORDER — BUPIVACAINE LIPOSOME 1.3 % IJ SUSP: 20.0000 mL | Freq: Once | INTRAMUSCULAR | Status: DC

## 2021-07-23 MED ORDER — CHLORHEXIDINE GLUCONATE 0.12 % MT SOLN
15.0000 mL | Freq: Once | OROMUCOSAL | Status: AC
  Administered 2021-07-23: 15 mL via OROMUCOSAL

## 2021-07-23 MED ORDER — PROPOFOL 10 MG/ML IV BOLUS
INTRAVENOUS | Status: DC | PRN
  Administered 2021-07-23: 170 mg via INTRAVENOUS

## 2021-07-23 MED ORDER — OXYCODONE HCL 5 MG PO TABS
5.0000 mg | ORAL_TABLET | Freq: Once | ORAL | Status: AC | PRN
  Administered 2021-07-23: 5 mg via ORAL

## 2021-07-23 MED ORDER — BUPIVACAINE-EPINEPHRINE (PF) 0.25% -1:200000 IJ SOLN
INTRAMUSCULAR | Status: AC
  Filled 2021-07-23: qty 30

## 2021-07-23 MED ORDER — OXYCODONE HCL 5 MG/5ML PO SOLN: 5.0000 mg | Freq: Once | ORAL | Status: AC | PRN

## 2021-07-23 MED ORDER — HYDROMORPHONE HCL 1 MG/ML IJ SOLN
INTRAMUSCULAR | Status: AC
  Filled 2021-07-23: qty 1

## 2021-07-23 MED ORDER — OXYCODONE HCL 5 MG PO TABS: 5.0000 mg | ORAL_TABLET | Freq: Four times a day (QID) | ORAL | 0 refills | Status: DC | PRN

## 2021-07-23 MED ORDER — HYDROMORPHONE HCL 1 MG/ML IJ SOLN
0.2500 mg | INTRAMUSCULAR | Status: DC | PRN
  Administered 2021-07-23 (×3): 0.5 mg via INTRAVENOUS

## 2021-07-23 MED ORDER — PROPOFOL 10 MG/ML IV BOLUS
INTRAVENOUS | Status: AC
Start: 2021-07-23 — End: ?
  Filled 2021-07-23: qty 20

## 2021-07-23 MED ORDER — BUPIVACAINE-EPINEPHRINE (PF) 0.25% -1:200000 IJ SOLN
INTRAMUSCULAR | Status: DC | PRN
  Administered 2021-07-23: 20 mL

## 2021-07-23 MED ORDER — DEXAMETHASONE SODIUM PHOSPHATE 10 MG/ML IJ SOLN
INTRAMUSCULAR | Status: DC | PRN
  Administered 2021-07-23: 10 mg via INTRAVENOUS

## 2021-07-23 MED ORDER — SUGAMMADEX SODIUM 200 MG/2ML IV SOLN
INTRAVENOUS | Status: DC | PRN
  Administered 2021-07-23: 200 mg via INTRAVENOUS

## 2021-07-23 MED ORDER — ROCURONIUM BROMIDE 10 MG/ML (PF) SYRINGE
PREFILLED_SYRINGE | INTRAVENOUS | Status: AC
  Filled 2021-07-23: qty 30

## 2021-07-23 MED ORDER — ROCURONIUM BROMIDE 100 MG/10ML IV SOLN
INTRAVENOUS | Status: DC | PRN
  Administered 2021-07-23: 60 mg via INTRAVENOUS
  Administered 2021-07-23 (×2): 20 mg via INTRAVENOUS

## 2021-07-23 MED ORDER — ONDANSETRON HCL 4 MG/2ML IJ SOLN
INTRAMUSCULAR | Status: AC
  Filled 2021-07-23: qty 2

## 2021-07-23 MED ORDER — CEFAZOLIN SODIUM-DEXTROSE 2-4 GM/100ML-% IV SOLN
2.0000 g | INTRAVENOUS | Status: AC
Start: 2021-07-23 — End: 2021-07-23
  Administered 2021-07-23: 2 g via INTRAVENOUS
  Filled 2021-07-23: qty 100

## 2021-07-23 MED ORDER — BUPIVACAINE LIPOSOME 1.3 % IJ SUSP
INTRAMUSCULAR | Status: AC
  Filled 2021-07-23: qty 20

## 2021-07-23 MED ORDER — CELECOXIB 200 MG PO CAPS
400.0000 mg | ORAL_CAPSULE | ORAL | Status: AC
  Administered 2021-07-23: 400 mg via ORAL
  Filled 2021-07-23: qty 2

## 2021-07-23 MED ORDER — LIDOCAINE HCL (CARDIAC) PF 100 MG/5ML IV SOSY
PREFILLED_SYRINGE | INTRAVENOUS | Status: DC | PRN
  Administered 2021-07-23: 60 mg via INTRAVENOUS

## 2021-07-23 MED ORDER — CHLORHEXIDINE GLUCONATE CLOTH 2 % EX PADS: 6.0000 | MEDICATED_PAD | Freq: Once | CUTANEOUS | Status: DC

## 2021-07-23 MED ORDER — FENTANYL CITRATE (PF) 100 MCG/2ML IJ SOLN
INTRAMUSCULAR | Status: AC
  Filled 2021-07-23: qty 2

## 2021-07-23 MED ORDER — ONDANSETRON HCL 4 MG/2ML IJ SOLN
INTRAMUSCULAR | Status: DC | PRN
  Administered 2021-07-23: 4 mg via INTRAVENOUS

## 2021-07-23 MED ORDER — ORAL CARE MOUTH RINSE: 15.0000 mL | Freq: Once | OROMUCOSAL | Status: AC

## 2021-07-23 MED ORDER — ONDANSETRON HCL 4 MG/2ML IJ SOLN: 4.0000 mg | Freq: Once | INTRAMUSCULAR | Status: DC | PRN

## 2021-07-23 MED ORDER — LACTATED RINGERS IV SOLN: INTRAVENOUS | Status: DC

## 2021-07-23 MED ORDER — ENSURE PRE-SURGERY PO LIQD
296.0000 mL | Freq: Once | ORAL | Status: DC
Start: 2021-07-24 — End: 2021-07-23
  Filled 2021-07-23: qty 296

## 2021-07-23 MED ORDER — MIDAZOLAM HCL 2 MG/2ML IJ SOLN
INTRAMUSCULAR | Status: AC
  Filled 2021-07-23: qty 2

## 2021-07-23 MED ORDER — FENTANYL CITRATE (PF) 100 MCG/2ML IJ SOLN
INTRAMUSCULAR | Status: DC | PRN
  Administered 2021-07-23: 100 ug via INTRAVENOUS
  Administered 2021-07-23 (×2): 50 ug via INTRAVENOUS

## 2021-07-23 MED ORDER — ACETAMINOPHEN 500 MG PO TABS
1000.0000 mg | ORAL_TABLET | ORAL | Status: AC
  Administered 2021-07-23: 1000 mg via ORAL
  Filled 2021-07-23: qty 2

## 2021-07-23 MED ORDER — DEXAMETHASONE SODIUM PHOSPHATE 10 MG/ML IJ SOLN
INTRAMUSCULAR | Status: AC
  Filled 2021-07-23: qty 1

## 2021-07-23 MED ORDER — IBUPROFEN 800 MG PO TABS: 800.0000 mg | ORAL_TABLET | Freq: Three times a day (TID) | ORAL | 0 refills | Status: DC | PRN

## 2021-07-23 MED ORDER — OXYCODONE HCL 5 MG PO TABS
ORAL_TABLET | ORAL | Status: AC
  Filled 2021-07-23: qty 1

## 2021-07-23 MED ORDER — BUPIVACAINE LIPOSOME 1.3 % IJ SUSP
INTRAMUSCULAR | Status: DC | PRN
  Administered 2021-07-23: 20 mL

## 2021-07-23 MED ORDER — EPHEDRINE 5 MG/ML INJ
INTRAVENOUS | Status: AC
  Filled 2021-07-23: qty 15

## 2021-07-23 MED ORDER — MIDAZOLAM HCL 5 MG/5ML IJ SOLN
INTRAMUSCULAR | Status: DC | PRN
  Administered 2021-07-23: 2 mg via INTRAVENOUS

## 2021-07-23 MED ORDER — LIDOCAINE HCL (PF) 2 % IJ SOLN
INTRAMUSCULAR | Status: AC
  Filled 2021-07-23: qty 5

## 2021-07-23 SURGICAL SUPPLY — 34 items
ADH SKN CLS APL DERMABOND .7 (GAUZE/BANDAGES/DRESSINGS) ×1
APL PRP STRL LF DISP 70% ISPRP (MISCELLANEOUS) ×1
BRR ADH 5X3 SEPRAFILM 6 SHT (MISCELLANEOUS) ×1
CABLE HIGH FREQUENCY MONO STRZ (ELECTRODE) ×2 IMPLANT
CHLORAPREP W/TINT 26 (MISCELLANEOUS) ×2 IMPLANT
COVER SURGICAL LIGHT HANDLE (MISCELLANEOUS) ×2 IMPLANT
DERMABOND ADVANCED (GAUZE/BANDAGES/DRESSINGS) ×1
DERMABOND ADVANCED .7 DNX12 (GAUZE/BANDAGES/DRESSINGS) IMPLANT
DEVICE SECURE STRAP 25 ABSORB (INSTRUMENTS) ×2 IMPLANT
DEVICE TROCAR PUNCTURE CLOSURE (ENDOMECHANICALS) ×2 IMPLANT
ELECT REM PT RETURN 15FT ADLT (MISCELLANEOUS) ×2 IMPLANT
ENDOLOOP SUT PDS II  0 18 (SUTURE) ×2
ENDOLOOP SUT PDS II 0 18 (SUTURE) IMPLANT
GLOVE SURG POLYISO LF SZ7 (GLOVE) ×2 IMPLANT
GLOVE SURG UNDER POLY LF SZ7 (GLOVE) ×2 IMPLANT
GOWN STRL REUS W/TWL LRG LVL3 (GOWN DISPOSABLE) ×3 IMPLANT
GOWN STRL REUS W/TWL XL LVL3 (GOWN DISPOSABLE) ×5 IMPLANT
HEMOSTAT SURGICEL 4X8 (HEMOSTASIS) ×1 IMPLANT
KIT BASIN OR (CUSTOM PROCEDURE TRAY) ×2 IMPLANT
KIT TURNOVER KIT A (KITS) ×1 IMPLANT
MARKER SKIN DUAL TIP RULER LAB (MISCELLANEOUS) ×2 IMPLANT
MESH 3DMAX 4X6 LT LRG (Mesh General) ×1 IMPLANT
MESH 3DMAX 4X6 RT LRG (Mesh General) ×1 IMPLANT
SEPRAFILM PROCEDURAL PACK 3X5 (MISCELLANEOUS) ×1 IMPLANT
SET TUBE SMOKE EVAC HIGH FLOW (TUBING) ×2 IMPLANT
SHEARS HARMONIC ACE PLUS 36CM (ENDOMECHANICALS) ×1 IMPLANT
SLEEVE XCEL OPT CAN 5 100 (ENDOMECHANICALS) ×2 IMPLANT
SUT MNCRL AB 4-0 PS2 18 (SUTURE) ×2 IMPLANT
SUT VICRYL 0 UR6 27IN ABS (SUTURE) ×2 IMPLANT
SUT VLOC 180 2-0 6IN GS21 (SUTURE) ×2 IMPLANT
TOWEL OR 17X26 10 PK STRL BLUE (TOWEL DISPOSABLE) ×2 IMPLANT
TRAY LAPAROSCOPIC (CUSTOM PROCEDURE TRAY) ×2 IMPLANT
TROCAR BLADELESS OPT 5 100 (ENDOMECHANICALS) ×2 IMPLANT
TROCAR XCEL 12X100 BLDLESS (ENDOMECHANICALS) ×2 IMPLANT

## 2021-07-23 NOTE — Anesthesia Postprocedure Evaluation (Signed)
Anesthesia Post Note ? ?Patient: Austin Faulkner ? ?Procedure(s) Performed: LAPAROSCOPIC BILATERAL INGUINAL HERNIA REPAIR WITH MESH (Bilateral) ? ?  ? ?Patient location during evaluation: PACU ?Anesthesia Type: General ?Level of consciousness: awake and alert and oriented ?Pain management: pain level controlled ?Vital Signs Assessment: post-procedure vital signs reviewed and stable ?Respiratory status: spontaneous breathing, nonlabored ventilation and respiratory function stable ?Cardiovascular status: blood pressure returned to baseline and stable ?Postop Assessment: no apparent nausea or vomiting ?Anesthetic complications: no ? ? ?No notable events documented. ? ?Last Vitals:  ?Vitals:  ? 07/23/21 1315 07/23/21 1330  ?BP: (!) 138/93 (!) 155/88  ?Pulse: 85 80  ?Resp: 18 14  ?Temp:    ?SpO2: 100% 100%  ?  ?Last Pain:  ?Vitals:  ? 07/23/21 1330  ?TempSrc:   ?PainSc: Asleep  ? ? ?  ?  ?  ?  ?  ?  ? ?Oseph Imburgia A. ? ? ? ? ?

## 2021-07-23 NOTE — Anesthesia Procedure Notes (Signed)
Procedure Name: Intubation ?Date/Time: 07/23/2021 10:23 AM ?Performed by: [place] Blossom Crume C, CRNA ?Pre-anesthesia Checklist: Patient identified, Emergency Drugs available, Suction available and Patient being monitored ?Patient Re-evaluated:Patient Re-evaluated prior to induction ?Oxygen Delivery Method: Circle system utilized ?Preoxygenation: Pre-oxygenation with 100% oxygen ?Induction Type: IV induction ?Ventilation: Mask ventilation without difficulty ?Laryngoscope Size: Mac and 4 ?Grade View: Grade II ?Tube type: Oral ?Tube size: 7.5 mm ?Number of attempts: 1 ?Airway Equipment and Method: Stylet and Oral airway ?Placement Confirmation: ETT inserted through vocal cords under direct vision, positive ETCO2 and breath sounds checked- equal and bilateral ?Secured at: 22 cm ?Tube secured with: Tape ?Dental Injury: Teeth and Oropharynx as per pre-operative assessment  ? ? ? ? ?

## 2021-07-23 NOTE — Discharge Instructions (Signed)
[facility] _______[facility]  UMBILICAL OR INGUINAL HERNIA REPAIR: POST OP INSTRUCTIONS  Always review your discharge instruction sheet given to you by the facility where your surgery was performed. IF YOU HAVE DISABILITY OR FAMILY LEAVE FORMS, YOU MUST BRING THEM TO THE OFFICE FOR PROCESSING.   DO NOT GIVE THEM TO YOUR DOCTOR.  1. A  prescription for pain medication may be given to you upon discharge.  Take your pain medication as prescribed, if needed.  If narcotic pain medicine is not needed, then you may take acetaminophen (Tylenol) or ibuprofen (Advil) as needed. 2. Take your usually prescribed medications unless otherwise directed. If you need a refill on your pain medication, please contact your pharmacy.  They will contact our office to request authorization. Prescriptions will not be filled after 5 pm or on week-ends. 3. You should follow a light diet the first 24 hours after arrival home, such as soup and crackers, etc.  Be sure to include lots of fluids daily.  Resume your normal diet the day after surgery. 4.Most patients will experience some swelling and bruising around the umbilicus or in the groin and scrotum.  Ice packs and reclining will help.  Swelling and bruising can take several days to resolve.  6. It is common to experience some constipation if taking pain medication after surgery.  Increasing fluid intake and taking a stool softener (such as Colace) will usually help or prevent this problem from occurring.  A mild laxative (Milk of Magnesia or Miralax) should be taken according to package directions if there are no bowel movements after 48 hours. 7. Unless discharge instructions indicate otherwise, you may remove your bandages 24-48 hours after surgery, and you may shower at that time.  You may have steri-strips (small skin tapes) in place directly over the incision.  These strips should be left on the skin for 7-10 days.  If your surgeon used skin glue on the  incision, you may shower in 24 hours.  The glue will flake off over the next 2-3 weeks.  Any sutures or staples will be removed at the office during your follow-up visit. 8. ACTIVITIES:  You may resume regular (light) daily activities beginning the next day--such as daily self-care, walking, climbing stairs--gradually increasing activities as tolerated.  You may have sexual intercourse when it is comfortable.  Refrain from any heavy lifting or straining until approved by your doctor.  a.You may drive when you are no longer taking prescription pain medication, you can comfortably wear a seatbelt, and you can safely maneuver your car and apply brakes. b.RETURN TO WORK:   _____________________________________________  9.You should see your doctor in the office for a follow-up appointment approximately 2-3 weeks after your surgery.  Make sure that you call for this appointment within a day or two after you arrive home to insure a convenient appointment time. 10.OTHER INSTRUCTIONS: _________________________    _____________________________________  WHEN TO CALL YOUR DOCTOR: Fever over 101.0 Inability to urinate Nausea and/or vomiting Extreme swelling or bruising Continued bleeding from incision. Increased pain, redness, or drainage from the incision  The clinic staff is available to answer your questions during regular business hours.  Please don't hesitate to call and ask to speak to one of the nurses for clinical concerns.  If you have a medical emergency, go to the nearest emergency room or call 911.  A surgeon from [facility] is always on call at the hospital   [address], [address],   [place], [place]  [address] ?  [address] [address], [place], [place]   [address] [phone] ? [phone] ? FAX [phone] Web site: [email or web address]  

## 2021-07-23 NOTE — Transfer of Care (Signed)
Immediate Anesthesia Transfer of Care Note ? ?Patient: Austin Faulkner ? ?Procedure(s) Performed: LAPAROSCOPIC BILATERAL INGUINAL HERNIA REPAIR WITH MESH (Bilateral) ? ?Patient Location: PACU ? ?Anesthesia Type:General ? ?Level of Consciousness: awake, alert  and oriented ? ?Airway & Oxygen Therapy: Patient Spontanous Breathing and Patient connected to face mask oxygen ? ?Post-op Assessment: Report given to RN and Post -op Vital signs reviewed and stable ? ?Post vital signs: Reviewed and stable ? ?Last Vitals:  ?Vitals Value Taken Time  ?BP 146/90 07/23/21 1230  ?Temp    ?Pulse 92 07/23/21 1231  ?Resp 13 07/23/21 1231  ?SpO2 100 % 07/23/21 1231  ?Vitals shown include unvalidated device data. ? ?Last Pain:  ?Vitals:  ? 07/23/21 0914  ?TempSrc:   ?PainSc: 4   ?   ? ?Patients Stated Pain Goal: 3 (07/23/21 0914) ? ?Complications: No notable events documented. ?

## 2021-07-23 NOTE — Anesthesia Preprocedure Evaluation (Addendum)
Anesthesia Evaluation  ?Patient identified by MRN, date of birth, ID band ?Patient awake ? ? ? ?Reviewed: ?Allergy & Precautions, NPO status , Patient's Chart, lab work & pertinent test results, reviewed documented beta blocker date and time  ? ?Airway ?Mallampati: I ? ?TM Distance: >3 FB ?Neck ROM: Full ? ? ? Dental ?no notable dental hx. ?(+) Teeth Intact, Dental Advisory Given ?  ?Pulmonary ?shortness of breath, asthma , pneumonia, resolved, COPD,  COPD inhaler, former smoker,  ?  ?Pulmonary exam normal ?breath sounds clear to auscultation ? ? ? ? ? ? Cardiovascular ?hypertension, Pt. on medications ?Normal cardiovascular exam+ Valvular Problems/Murmurs  ?Rhythm:Regular Rate:Normal ? ? ?  ?Neuro/Psych ?PSYCHIATRIC DISORDERS Depression PTSD Neuromuscular disease   ? GI/Hepatic ?Neg liver ROS, GERD  Medicated and Controlled,Hx/o diverticulitis ?  ?Endo/Other  ? ? Renal/GU ?negative Renal ROS  ?negative genitourinary ?  ?Musculoskeletal ? ?(+) Arthritis , Rheumatoid disorders,  Bilateral Inguinal Hernias  ? Abdominal ?  ?Peds ? Hematology ? ?(+) Blood dyscrasia, anemia ,   ?Anesthesia Other Findings ? ? Reproductive/Obstetrics ? ?  ? ? ? ? ? ? ? ? ? ? ? ? ? ?  ?  ? ? ? ? ? ? ? ?Anesthesia Physical ?Anesthesia Plan ? ?ASA: 3 ? ?Anesthesia Plan: General  ? ?Post-op Pain Management:   ? ?Induction: Intravenous ? ?PONV Risk Score and Plan: 4 or greater and Treatment may vary due to age or medical condition, Ondansetron and Dexamethasone ? ?Airway Management Planned: Oral ETT ? ?Additional Equipment:  ? ?Intra-op Plan:  ? ?Post-operative Plan: Extubation in OR ? ?Informed Consent: I have reviewed the patients History and Physical, chart, labs and discussed the procedure including the risks, benefits and alternatives for the proposed anesthesia with the patient or authorized representative who has indicated his/her understanding and acceptance.  ? ? ? ?Dental advisory given ? ?Plan  Discussed with: CRNA and Anesthesiologist ? ?Anesthesia Plan Comments:   ? ? ? ? ? ? ?Anesthesia Quick Evaluation ? ?

## 2021-07-23 NOTE — Op Note (Signed)
Preop diagnosis: bilateral inguinal hernia ? ?Postop diagnosis: bilateral inguinal hernia ? ?Procedure: laparoscopic Bilateral inguinal hernia repair with mesh ? ?Surgeon: Austin Faulkner, M.D. ? ?Asst: Dimitrius Moris ? ?Anesthesia: Gen.  ? ?Indications for procedure: Austin Faulkner is a 66 y.o. male with symptoms of pain and enlarging Bilateral inguinal hernia(s). After discussing risks, alternatives and benefits he decided on laparoscopic repair and was brought to day surgery for repair. ? ?Description of procedure: The patient was brought into the operative suite, placed supine. Anesthesia was administered with endotracheal tube. Patient was strapped in place. The patient was prepped and draped in the usual sterile fashion. ? ?Next Exparel:Marcaine mix was injected to the left of the umbilicus, and a transverse 2 cm incision was made. Dissection was used to visualize the anterior rectus sheath. Anterior rectus sheath was sharply incised, next to the 12 mm trocar was inserted into the preperitoneal space. Laparoscope was inserted and found that the peritoneum had a small hole. Therefore, decision was made to convert to a TAP type repair. 1 34mm trocar was placed in the left lower quadrant and 1 28mm trocar was placed in the right lower quadrant. Next the peritoneum was incised with harmonic scalpel and blunt dissection was used to create a peritoneal flap by freeing it away from the rectus muscles. ? ?On initial visualization , there was a large right inguinal direct hernia, a small right indirect inguinal hernia, and a small left indirect inguinal hernia. Next I began our dissection on the right identifying the ASIS laterally and then working back medially removing the filmy tissue and adhesions of the peritoneum to the abdominal wall. Hernia sac was completely dissected out of the canal. Vas deference and contents of the cord were safely dissected away of the hernia sac. The Exparel mix was infused along the  transversus abdominis planes laterally and near the lacunar ligament medially. ? ?Next I began our dissection on the left identifying the ASIS laterally and then working back medially removing the filmy tissue and adhesions of the peritoneum to the abdominal wall. Hernia sac was completely dissected out of the canal. Vas deference and contents of the cord were safely dissected away of the hernia sac. The Exparel mix was infused along the transversus abdominis planes laterally and near the lacunar ligament medially. ? ?There was a peritoneal tear on the left repaired with a 2-0 v loc. There was a tear on the right repaired with a 0 PDS endoloop. ? ?A large left 3D max mesh was inserted and tacked medially to the lacunar ligament. A large right 3D max was inserted and tacked medially to the lacunar ligament. The mesh was positioned flat and directly up against the direct and indirect areas. Tacks were used to appose the peritoneum to the abdominal wall. On the left there was difficulty for apposition and a v loc was used to appose the peritoneum better. Additionally there was a final central peritoneal defect that was covered with surgicel. The periumbilical area was closed with 0 vicryl by suture passer.  The CO2 was evacuated while watching to ensure the mesh did not migrate.. The anterior rectus fascia was closed with 0 vicryl in interrupted sutures and all skin incisions were closed with 4-0 monocryl subcu stitch. The patient awoke from anesthesia and was brought to PACU in stable condition. ? ?Findings: large direct right and small right indirect inguinal hernia, small left indirect inguinal hernia ? ?Specimen: none ? ?Blood loss: 20 ml ? ?Local anesthesia: 50  ml Exparel:Marcaine mix ? ?Complications: none ? ?Implant: large right and large left Bard 3D max mesh ? ?Austin RossettiLuke Hans Faulkner, M.D. ?General, Bariatric, & Minimally Invasive Surgery ?[facility] ?12:18 PM ?07/23/2021 ? ?

## 2021-07-23 NOTE — H&P (Signed)
Chief Complaint: Right Inguinal Hernia ? ?History of Present Illness: ?Austin Faulkner is a 66 y.o. male who is seen today as an office consultation at the request of Dr. Peggyann Juba for evaluation of Right Inguinal Hernia ? ?He first noticed the hernia 1 month ago. Symptoms are dull ache in the area, especially with activity. He denies nausea or vomiting or bowel habit change. ? ?He does not smoke ?He does not have diabetes ?He has no history of hernias ? ?Review of Systems: ?A complete review of systems was obtained from the patient. I have reviewed this information and discussed as appropriate with the patient. See HPI as well for other ROS. ? ?Review of Systems  ?Constitutional: Negative.  ?HENT: Negative.  ?Eyes: Negative.  ?Respiratory: Negative.  ?Cardiovascular: Negative.  ?Gastrointestinal: Negative.  ?Genitourinary: Negative.  ?Musculoskeletal: Negative.  ?Skin: Negative.  ?Neurological: Negative.  ?Endo/Heme/Allergies: Negative.  ?Psychiatric/Behavioral: Negative.  ? ? ?Medical History: ?Past Medical History:  ?Diagnosis Date  ? Anxiety  ? Arthritis  ? Asthma, unspecified asthma severity, unspecified whether complicated, unspecified whether persistent  ? COPD (chronic obstructive pulmonary disease) (CMS-HCC)  ? GERD (gastroesophageal reflux disease)  ? ?There is no problem list on file for this patient. ? ?Past Surgical History:  ?Procedure Laterality Date  ? Knee Replacement x2 2001  ? Shoulder Right Cuff Repair 2010  ? XI Robotic Assisted Laparoscopic Sigmoidectomy 06/23/2016  ?Dr. Maisie Fus  ? REPLACEMENT TOTAL HIP W/ RESURFACING IMPLANTS 07/24/2019  ? ? ?Allergies  ?Allergen Reactions  ? Azithromycin Other (See Comments) and Palpitations  ?Patient reports does not help with infections. ? ? Sildenafil Other (See Comments), Unknown and Headache  ?Viagra, doesn't help ?Other reaction(s): Headache, Visual disturbance, Other (See Comments) ?Viagra, doesn't help ? ? ?Current Outpatient Medications on File  Prior to Visit  ?Medication Sig Dispense Refill  ? albuterol 90 mcg/actuation inhaler INHALE 2 PUFFS BY MOUTH FOUR TIMES A DAY AS NEEDED  ? cefUROXime (CEFTIN) 250 MG tablet TAKE ONE TABLET BY MOUTH TWICE A DAY - TAKE AS NEEDED YELLOW MUCUS THICK DISCOLORED SPUTUM AND FLARES OF WHEEZING  ? cetirizine (ZYRTEC) 10 MG tablet Take 1 tablet by mouth once daily  ? EPINEPHrine (EPIPEN) 0.3 mg/0.3 mL auto-injector INJECT 0.3 ML INTRAMUSCULARLY AS NEEDED  ? fluocinonide (LIDEX) 0.05 % ointment APPLY THIN LAYER TO AFFECTED AREA TWICE A DAY  ? fluticasone propion-salmeteroL (ADVAIR HFA) 115-21 mcg/actuation inhaler INHALE 2 PUFFS BY MOUTH TWICE A DAY (RINSE MOUTH WELL WITH WATER AFTER EACH USE)  ? fluticasone propionate (FLONASE) 50 mcg/actuation nasal spray INSTILL 1 SPRAY IN EACH NOSTRIL TWICE A DAY  ? hydroCHLOROthiazide (HYDRODIURIL) 25 MG tablet TAKE ONE TABLET BY MOUTH DAILY IN THE MORNING (WATER PILL)  ? hydrOXYchloroQUINE (PLAQUENIL) 200 mg tablet Take 1 tablet by mouth 2 (two) times daily  ? ketotifen (ZADITOR) 0.025 % (0.035 %) ophthalmic solution INSTILL 1 DROP IN BOTH EYES TWICE A DAY AS NEEDED  ? meloxicam (MOBIC) 15 MG tablet Take 1 tablet by mouth once daily  ? multivitamin tablet Take 1 tablet by mouth once daily  ? PARoxetine (PAXIL) 40 MG tablet TAKE ONE-HALF TABLET BY MOUTH AT BEDTIME FOR MENTAL HEALTH/ DEPRESSION  ? sulfaSALAzine (AZULFIDINE) 500 mg tablet TAKE ONE TABLET BY MOUTH TWICE A DAY - IF ANY RASH OR ANY ALARMING SIDE-EFFECT, STOP AND CALL 911/GO TO ER.  ? tadalafiL (CIALIS) 20 MG tablet TAKE ONE TABLET BY MOUTH AS INSTRUCTED (TAKE 1 HOUR PRIOR TO SEXUAL ACTIVITY *DO NOT EXCEED 1 DOSE PER  24 HOUR PERIOD*)  ? tamsulosin (FLOMAX) 0.4 mg capsule Take 1 capsule by mouth at bedtime  ? tiotropium bromide (SPIRIVA RESPIMAT) 2.5 mcg/actuation inhalation spray INHALE 2 PUFFS BY MOUTH ONCE A DAY -- USE THIS 10 MINUTES AFTER SYMBICORT 2 SPRAYS EVERY MORNING NOT AT NIGHT  ? traZODone (DESYREL) 100 MG tablet  TAKE ONE TABLET BY MOUTH AT BEDTIME FOR SLEEP  ? omeprazole (PRILOSEC) 20 MG DR capsule  ? ?No current facility-administered medications on file prior to visit.  ? ?Family History  ?Problem Relation Age of Onset  ? High blood pressure (Hypertension) Father  ? Coronary Artery Disease (Blocked arteries around heart) Father  ? Obesity Sister  ? ? ?Social History  ? ?Tobacco Use  ?Smoking Status Never  ?Smokeless Tobacco Never  ? ? ?Social History  ? ?Socioeconomic History  ? Marital status: Married  ?Tobacco Use  ? Smoking status: Never  ? Smokeless tobacco: Never  ?Substance and Sexual Activity  ? Alcohol use: Never  ? Drug use: Never  ? ?Objective:  ? ?Vitals:  ?05/15/21 0949  ?BP: (!) 140/78  ?Pulse: 94  ?Temp: 36.6 ?C (97.8 ?F)  ?SpO2: 99%  ?Weight: 84.4 kg (186 lb)  ?Height: 177.8 cm (5\' 10" )  ? ?Body mass index is 26.69 kg/m?. ? ?Physical Exam ?Constitutional:  ?Appearance: Normal appearance.  ?HENT:  ?Head: Normocephalic and atraumatic.  ?Pulmonary:  ?Effort: Pulmonary effort is normal.  ?Abdominal:  ?Comments: Moderate right inguinal hernia, small left inguinal hernia  ?Musculoskeletal:  ?General: Normal range of motion.  ?Cervical back: Normal range of motion.  ?Neurological:  ?General: No focal deficit present.  ?Mental Status: He is alert and oriented to person, place, and time. Mental status is at baseline.  ?Psychiatric:  ?Mood and Affect: Mood normal.  ?Behavior: Behavior normal.  ?Thought Content: Thought content normal.  ? ?Labs, Imaging and Diagnostic Testing: ?I reviewed CT from 2017 showing small developing hernias. I reviewed notes from [facility] ? ?Assessment and Plan:  ?Diagnoses and all orders for this visit: ? ?Non-recurrent bilateral inguinal hernia without obstruction or gangrene ? ? ? ?We discussed etiology of hernias and how they can cause pain. We discussed options for inguinal hernia repair vs observation. We discussed details of the surgery of general anesthesia, surgical  approach and incisions, dissecting the sack away from vas deference, testicular vessels and nerves and placement of mesh. We discussed risks of bleeding, infection, recurrence, injury to vas deference, testicular vessels, nerve injury, and chronic pain. He showed good understanding and wanted proceed with laparoscopic bilateral inguinal hernia repair as outpatient.  ? ?

## 2021-07-24 ENCOUNTER — Encounter (HOSPITAL_COMMUNITY): Payer: Self-pay | Admitting: General Surgery

## 2021-08-03 ENCOUNTER — Ambulatory Visit (INDEPENDENT_AMBULATORY_CARE_PROVIDER_SITE_OTHER): Payer: Medicare Other | Admitting: Family

## 2021-08-03 VITALS — BP 129/79 | HR 82 | Temp 98.3°F | Resp 16 | Wt 183.0 lb

## 2021-08-03 DIAGNOSIS — R350 Frequency of micturition: Secondary | ICD-10-CM

## 2021-08-03 DIAGNOSIS — D649 Anemia, unspecified: Secondary | ICD-10-CM | POA: Diagnosis not present

## 2021-08-03 DIAGNOSIS — Z01818 Encounter for other preprocedural examination: Secondary | ICD-10-CM

## 2021-08-03 LAB — COMPREHENSIVE METABOLIC PANEL
ALT: 13 U/L (ref 0–53)
AST: 13 U/L (ref 0–37)
Albumin: 4.2 g/dL (ref 3.5–5.2)
Alkaline Phosphatase: 52 U/L (ref 39–117)
BUN: 14 mg/dL (ref 6–23)
CO2: 31 mEq/L (ref 19–32)
Calcium: 9.3 mg/dL (ref 8.4–10.5)
Chloride: 100 mEq/L (ref 96–112)
Creatinine, Ser: 1.01 mg/dL (ref 0.40–1.50)
GFR: 79.24 mL/min (ref >60.00)
Glucose, Bld: 86 mg/dL (ref 70–99)
Potassium: 4 mEq/L (ref 3.5–5.1)
Sodium: 139 mEq/L (ref 135–145)
Total Bilirubin: 0.5 mg/dL (ref 0.2–1.2)
Total Protein: 6.3 g/dL (ref 6.0–8.3)

## 2021-08-03 LAB — CBC WITH DIFFERENTIAL/PLATELET
Basophils Absolute: 0.1 10*3/uL (ref 0.0–0.1)
Basophils Relative: 1.3 % (ref 0.0–3.0)
Eosinophils Absolute: 0.2 10*3/uL (ref 0.0–0.7)
Eosinophils Relative: 3.1 % (ref 0.0–5.0)
HCT: 39 % (ref 39.0–52.0)
Hemoglobin: 12.9 g/dL — ABNORMAL LOW (ref 13.0–17.0)
Lymphocytes Relative: 37 % (ref 12.0–46.0)
Lymphs Abs: 2.4 10*3/uL (ref 0.7–4.0)
MCHC: 33 g/dL (ref 30.0–36.0)
MCV: 94.3 fl (ref 78.0–100.0)
Monocytes Absolute: 0.8 10*3/uL (ref 0.1–1.0)
Monocytes Relative: 12.4 % — ABNORMAL HIGH (ref 3.0–12.0)
Neutro Abs: 3 10*3/uL (ref 1.4–7.7)
Neutrophils Relative %: 46.2 % (ref 43.0–77.0)
Platelets: 355 10*3/uL (ref 150.0–400.0)
RBC: 4.14 Mil/uL — ABNORMAL LOW (ref 4.22–5.81)
RDW: 13.5 % (ref 11.5–15.5)
WBC: 6.5 10*3/uL (ref 4.0–10.5)

## 2021-08-03 NOTE — Assessment & Plan Note (Signed)
EKG tracing is personally reviewed.  EKG notes NSR.  No acute changes.  ?Will obtain labs as ordered.  Had recent CXR, WNL.  Normal lung exam today.  Further recommendations pending review of labs.  ?

## 2021-08-03 NOTE — Progress Notes (Addendum)
Subjective:   By signing my name below, I, Austin Faulkner, attest that this documentation has been prepared under the direction and in the presence of Austin Alar, NP 08/03/2021       Patient ID: Austin Faulkner, male    DOB: 08/13/1955, 66 y.o.   MRN: [id number]  Chief Complaint  Patient presents with   Follow-up    Follow up after hernia surgery and prior to hip surgery    HPI Patient is in today for a pre-operative evaluation/clearance for upcoming Left Total Hip Arthroplasty scheduled for 08/27/21.  He recently underwent bilateral hernia repair. He reports he is feeling well after his bilateral hernia repair. No post-op complications. Adds he is still sore because he is hesitant about using oxycodone. He has an upcoming hip replacement on 04/06. He has been experiencing constipation but is using miralax to manage.  Past Medical History:  Diagnosis Date   Asthma    COPD (chronic obstructive pulmonary disease) (HCC)    Depression    PTSD   Diverticulitis    Diverticulitis of colon with perforation 03/04/2016   Dyspnea    secondary to seasonal allergies; or illness   GERD (gastroesophageal reflux disease)    Heart murmur    slight since birth   Hypertension    Pneumonia yrs ago   Rheumatoid arthritis ([place])    Seasonal allergies    Shoulder dislocation     Past Surgical History:  Procedure Laterality Date   INGUINAL HERNIA REPAIR Bilateral 07/23/2021   Procedure: LAPAROSCOPIC BILATERAL INGUINAL HERNIA REPAIR WITH MESH;  Surgeon: Kinsinger, Arta Bruce, MD;  Location: [facility];  Service: General;  Laterality: Bilateral;   knee arthroscopy and cartlidge repair Bilateral    5 on right and 5 on left   SHOULDER ARTHROSCOPY Left 2008   SMALL INTESTINE SURGERY  2017   small intestine resection   TOTAL KNEE ARTHROPLASTY Bilateral 2002   2010    Family History  Problem Relation Age of Onset   Lung cancer Mother    Heart attack Father 61       "healthier than I am"    Lupus Sister    Multiple myeloma Sister        BMT at 35   Stroke Paternal Grandmother    Alpha-1 antitrypsin deficiency Neg Hx     Social History   Socioeconomic History   Marital status: Married    Spouse name: Austin Faulkner   Number of children: 3   Years of education: Not on file   Highest education level: Not on file  Occupational History   Occupation: retired  Tobacco Use   Smoking status: Former    Types: Cigarettes, Cigars    Quit date: 2019    Years since quitting: 4.1    Passive exposure: Yes   Smokeless tobacco: Never  Vaping Use   Vaping Use: Never used  Substance and Sexual Activity   Alcohol use: No   Drug use: No   Sexual activity: Yes    Partners: Female  Other Topics Concern   Not on file  Social History Narrative   Not on file   Social Determinants of Health   Financial Resource Strain: Not on file  Food Insecurity: Not on file  Transportation Needs: Not on file  Physical Activity: Not on file  Stress: Not on file  Social Connections: Not on file  Intimate Partner Violence: Not on file    Outpatient Medications Prior to Visit  Medication Sig  Dispense Refill   acetaminophen (TYLENOL) 650 MG CR tablet Take 1,300 mg by mouth every 8 (eight) hours as needed for pain.     albuterol (VENTOLIN HFA) 108 (90 Base) MCG/ACT inhaler Inhale 2 puffs into the lungs every 6 (six) hours as needed for wheezing or shortness of breath.      Ascorbic Acid (VITAMIN C) 1000 MG tablet Take 1,000 mg by mouth daily.     cefUROXime (CEFTIN) 250 MG tablet Take 250 mg by mouth daily as needed (Take 250 mg twice a day as need after breathing treatment).     cetirizine (ZYRTEC) 10 MG tablet Take 1 tablet (10 mg total) by mouth daily. 30 tablet 11   EPINEPHrine 0.3 mg/0.3 mL IJ SOAJ injection Inject 0.3 mg into the muscle as needed for anaphylaxis.     fluocinonide ointment (LIDEX) 8.56 % Apply 1 application topically daily as needed (Eczema).     fluticasone (FLONASE) 50  MCG/ACT nasal spray Place 1-2 sprays into both nostrils daily as needed for allergies or rhinitis.     fluticasone-salmeterol (ADVAIR HFA) 115-21 MCG/ACT inhaler Inhale 2 puffs into the lungs 2 (two) times daily.     hydrochlorothiazide (HYDRODIURIL) 25 MG tablet Take 25 mg by mouth daily.     hydroxychloroquine (PLAQUENIL) 200 MG tablet Take 200 mg by mouth 2 (two) times daily.     ibuprofen (ADVIL) 800 MG tablet Take 1 tablet (800 mg total) by mouth every 8 (eight) hours as needed. 30 tablet 0   ipratropium-albuterol (DUONEB) 0.5-2.5 (3) MG/3ML SOLN Take 3 mLs by nebulization every 6 (six) hours as needed. 360 mL    ketotifen (ZADITOR) 0.025 % ophthalmic solution Place 1 drop into both eyes daily as needed for allergies.     leflunomide (ARAVA) 20 MG tablet Take 20 mg by mouth at bedtime.     meloxicam (MOBIC) 15 MG tablet Take 15 mg by mouth daily.     methylPREDNISolone (MEDROL DOSEPAK) 4 MG TBPK tablet Take 4 mg by mouth as needed (Breathing flair-up).     Multiple Vitamin (MULTIVITAMIN WITH MINERALS) TABS tablet Take 1 tablet by mouth daily.     omeprazole (PRILOSEC) 20 MG capsule Take 20 mg by mouth daily before breakfast.     PARoxetine (PAXIL) 40 MG tablet Take 20 mg by mouth at bedtime.     PRESCRIPTION MEDICATION 2 (two) times a week. Allergy injection     Probiotic Product (PROBIOTIC DAILY PO) Take 1 capsule by mouth daily before breakfast. 100 billion     sulfaSALAzine (AZULFIDINE) 500 MG tablet Take 500 mg by mouth 2 (two) times daily.     tadalafil (CIALIS) 20 MG tablet Take 20 mg by mouth daily as needed for erectile dysfunction.     tamsulosin (FLOMAX) 0.4 MG CAPS capsule Take 0.4 mg by mouth at bedtime.     Tiotropium Bromide Monohydrate (SPIRIVA RESPIMAT) 1.25 MCG/ACT AERS Inhale 2 puffs into the lungs 2 (two) times daily.     traZODone (DESYREL) 100 MG tablet Take 100 mg by mouth at bedtime.     TURMERIC CURCUMIN PO Take 900 mg by mouth daily before breakfast.     oxyCODONE  (OXY IR/ROXICODONE) 5 MG immediate release tablet Take 1 tablet (5 mg total) by mouth every 6 (six) hours as needed for severe pain. 8 tablet 0   No facility-administered medications prior to visit.    Allergies  Allergen Reactions   Tape Other (See Comments)    Must use  paper tape   Azithromycin Other (See Comments)    Patient reports does not help with infections.    Sildenafil Other (See Comments)    Other reaction(s): Headache, Visual disturbance, Other (See Comments) Viagra, doesn't help  Other reaction(s): Headache, Pain in eye    Review of Systems  Constitutional:  Negative for fever.  HENT:  Negative for ear pain and hearing loss.        (-)nystagmus (-)adenopathy  Eyes:  Negative for blurred vision.  Respiratory:  Negative for cough, shortness of breath and wheezing.   Cardiovascular:  Negative for chest pain and leg swelling.  Gastrointestinal:  Positive for constipation. Negative for blood in stool, diarrhea, nausea and vomiting.  Genitourinary:  Negative for dysuria and frequency.  Musculoskeletal:  Negative for joint pain and myalgias.  Skin:  Negative for rash.  Neurological:  Negative for headaches.  Psychiatric/Behavioral:  Negative for depression. The patient is not nervous/anxious.       Objective:    Physical Exam Constitutional:      General: He is not in acute distress.    Appearance: Normal appearance.  HENT:     Head: Normocephalic and atraumatic.  Cardiovascular:     Rate and Rhythm: Normal rate and regular rhythm.     Heart sounds: No murmur heard. Pulmonary:     Effort: No respiratory distress.     Breath sounds: Normal breath sounds. No wheezing or rales.  Skin:    General: Skin is warm and dry.     Comments: Laparoscopic incisions clean/dry and intact. No erythema  Neurological:     Mental Status: He is alert and oriented to person, place, and time.  Psychiatric:        Behavior: Behavior normal.        Thought Content: Thought  content normal.    BP 129/79 (BP Location: Right Arm, Patient Position: Sitting, Cuff Size: Small)    Pulse 82    Temp 98.3 F (36.8 C) (Oral)    Resp 16    Wt 183 lb (83 kg)    SpO2 100%    BMI 26.26 kg/m  Wt Readings from Last 3 Encounters:  08/03/21 183 lb (83 kg)  07/23/21 182 lb 15.7 oz (83 kg)  07/15/21 183 lb (83 kg)    Diabetic Foot Exam - Simple   No data filed    Lab Results  Component Value Date   WBC 5.5 07/13/2021   HGB 12.8 (L) 07/13/2021   HCT 39.8 07/13/2021   PLT 338 07/13/2021   GLUCOSE 89 07/13/2021   ALT 17 07/15/2021   AST 18 07/15/2021   NA 139 07/13/2021   K 4.0 07/13/2021   CL 105 07/13/2021   CREATININE 0.93 07/13/2021   BUN 16 07/13/2021   CO2 29 07/13/2021   INR 1.0 07/15/2021   HGBA1C 5.5 06/22/2016    No results found for: TSH Lab Results  Component Value Date   WBC 5.5 07/13/2021   HGB 12.8 (L) 07/13/2021   HCT 39.8 07/13/2021   MCV 96.1 07/13/2021   PLT 338 07/13/2021   Lab Results  Component Value Date   NA 139 07/13/2021   K 4.0 07/13/2021   CO2 29 07/13/2021   GLUCOSE 89 07/13/2021   BUN 16 07/13/2021   CREATININE 0.93 07/13/2021   BILITOT 0.6 07/15/2021   ALKPHOS 46 07/15/2021   AST 18 07/15/2021   ALT 17 07/15/2021   PROT 6.7 07/15/2021   ALBUMIN 4.4 07/15/2021  CALCIUM 8.9 07/13/2021   ANIONGAP 5 07/13/2021   GFR 76.66 04/21/2021   No results found for: CHOL No results found for: HDL No results found for: LDLCALC No results found for: TRIG No results found for: CHOLHDL Lab Results  Component Value Date   HGBA1C 5.5 06/22/2016       Assessment & Plan:   Problem List Items Addressed This Visit       Unprioritized   Preoperative examination    EKG tracing is personally reviewed.  EKG notes NSR.  No acute changes.  Will obtain labs as ordered.  Had recent CXR, WNL.  Normal lung exam today.  Further recommendations pending review of labs.       Anemia    Mild. Check CBC.       Other Visit  Diagnoses     Preoperative evaluation to rule out surgical contraindication    -  Primary   Relevant Orders   Comp Met (CMET)   CBC with Differential/Platelet   DG Chest 2 View   EKG 12-Lead (Completed)   Urinary frequency       Relevant Orders   Urine Culture       No orders of the defined types were placed in this encounter.   I,Austin Faulkner,acting as a Education administrator for [employer], NP.,have documented all relevant documentation on the behalf of Nance Pear, NP,as directed by  Nance Pear, NP while in the presence of Nance Pear, NP.   I, Austin Alar, NP, personally preformed the services described in this documentation.  All medical record entries made by the scribe were at my direction and in my presence.  I have reviewed the chart and discharge instructions (if applicable) and agree that the record reflects my personal performance and is accurate and complete. 08/03/2021

## 2021-08-03 NOTE — Assessment & Plan Note (Signed)
Mild. Check CBC.  ?

## 2021-08-05 LAB — URINE CULTURE
MICRO NUMBER:: 13122095
Result:: NO GROWTH
SPECIMEN QUALITY:: ADEQUATE

## 2021-09-14 ENCOUNTER — Ambulatory Visit (INDEPENDENT_AMBULATORY_CARE_PROVIDER_SITE_OTHER): Payer: Medicare Other

## 2021-09-14 VITALS — Ht 70.0 in | Wt 172.0 lb

## 2021-09-14 DIAGNOSIS — Z0000 Encounter for general adult medical examination without abnormal findings: Secondary | ICD-10-CM | POA: Diagnosis not present

## 2021-09-14 NOTE — Patient Instructions (Addendum)
Mr. Austin Faulkner , ?Thank you for taking time to complete your Medicare Wellness Visit. I appreciate your ongoing commitment to your health goals. Please review the following plan we discussed and let me know if I can assist you in the future.  ? ?Screening recommendations/referrals: ?Colonoscopy: Completed 01/29/2021-Due 01/29/2026 ?Recommended yearly ophthalmology/optometry visit for glaucoma screening and checkup ?Recommended yearly dental visit for hygiene and checkup ? ?Vaccinations: ?Influenza vaccine: Up to date ?Pneumococcal vaccine: Up to date ?Tdap vaccine: Up to date-Please bring dates for your chart. ?Shingles vaccine: Due-May obtain vaccine at your local pharmacy. ?Covid-19: Up to date ? ?Advanced directives: Please bring a copy of Living Will and/or Healthcare Power of Attorney for your chart. ? ? ?Conditions/risks identified: See problem list ? ?Next appointment: Follow up in one year for your annual wellness visit.  ? ?Preventive Care 14 Years and Older, Male ?Preventive care refers to lifestyle choices and visits with your health care provider that can promote health and wellness. ?What does preventive care include? ?A yearly physical exam. This is also called an annual well check. ?Dental exams once or twice a year. ?Routine eye exams. Ask your health care provider how often you should have your eyes checked. ?Personal lifestyle choices, including: ?Daily care of your teeth and gums. ?Regular physical activity. ?Eating a healthy diet. ?Avoiding tobacco and drug use. ?Limiting alcohol use. ?Practicing safe sex. ?Taking low doses of aspirin every day. ?Taking vitamin and mineral supplements as recommended by your health care provider. ?What happens during an annual well check? ?The services and screenings done by your health care provider during your annual well check will depend on your age, overall health, lifestyle risk factors, and family history of disease. ?Counseling  ?Your health care provider may ask  you questions about your: ?Alcohol use. ?Tobacco use. ?Drug use. ?Emotional well-being. ?Home and relationship well-being. ?Sexual activity. ?Eating habits. ?History of falls. ?Memory and ability to understand (cognition). ?Work and work Astronomer. ?Screening  ?You may have the following tests or measurements: ?Height, weight, and BMI. ?Blood pressure. ?Lipid and cholesterol levels. These may be checked every 5 years, or more frequently if you are over 56 years old. ?Skin check. ?Lung cancer screening. You may have this screening every year starting at age 90 if you have a 30-pack-year history of smoking and currently smoke or have quit within the past 15 years. ?Fecal occult blood test (FOBT) of the stool. You may have this test every year starting at age 53. ?Flexible sigmoidoscopy or colonoscopy. You may have a sigmoidoscopy every 5 years or a colonoscopy every 10 years starting at age 52. ?Prostate cancer screening. Recommendations will vary depending on your family history and other risks. ?Hepatitis C blood test. ?Hepatitis B blood test. ?Sexually transmitted disease (STD) testing. ?Diabetes screening. This is done by checking your blood sugar (glucose) after you have not eaten for a while (fasting). You may have this done every 1-3 years. ?Abdominal aortic aneurysm (AAA) screening. You may need this if you are a current or former smoker. ?Osteoporosis. You may be screened starting at age 1 if you are at high risk. ?Talk with your health care provider about your test results, treatment options, and if necessary, the need for more tests. ?Vaccines  ?Your health care provider may recommend certain vaccines, such as: ?Influenza vaccine. This is recommended every year. ?Tetanus, diphtheria, and acellular pertussis (Tdap, Td) vaccine. You may need a Td booster every 10 years. ?Zoster vaccine. You may need this after age 2. ?  Pneumococcal 13-valent conjugate (PCV13) vaccine. One dose is recommended after age  46. ?Pneumococcal polysaccharide (PPSV23) vaccine. One dose is recommended after age 75. ?Talk to your health care provider about which screenings and vaccines you need and how often you need them. ?This information is not intended to replace advice given to you by your health care provider. Make sure you discuss any questions you have with your health care provider. ?Document Released: 06/06/2015 Document Revised: 01/28/2016 Document Reviewed: 03/11/2015 ?Elsevier Interactive Patient Education ? 2017 [employer]. ? ?Fall Prevention in the Home ?Falls can cause injuries. They can happen to people of all ages. There are many things you can do to make your home safe and to help prevent falls. ?What can I do on the outside of my home? ?Regularly fix the edges of walkways and driveways and fix any cracks. ?Remove anything that might make you trip as you walk through a door, such as a raised step or threshold. ?Trim any bushes or trees on the path to your home. ?Use bright outdoor lighting. ?Clear any walking paths of anything that might make someone trip, such as rocks or tools. ?Regularly check to see if handrails are loose or broken. Make sure that both sides of any steps have handrails. ?Any raised decks and porches should have guardrails on the edges. ?Have any leaves, snow, or ice cleared regularly. ?Use sand or salt on walking paths during winter. ?Clean up any spills in your garage right away. This includes oil or grease spills. ?What can I do in the bathroom? ?Use night lights. ?Install grab bars by the toilet and in the tub and shower. Do not use towel bars as grab bars. ?Use non-skid mats or decals in the tub or shower. ?If you need to sit down in the shower, use a plastic, non-slip stool. ?Keep the floor dry. Clean up any water that spills on the floor as soon as it happens. ?Remove soap buildup in the tub or shower regularly. ?Attach bath mats securely with double-sided non-slip rug tape. ?Do not have throw  rugs and other things on the floor that can make you trip. ?What can I do in the bedroom? ?Use night lights. ?Make sure that you have a light by your bed that is easy to reach. ?Do not use any sheets or blankets that are too big for your bed. They should not hang down onto the floor. ?Have a firm chair that has side arms. You can use this for support while you get dressed. ?Do not have throw rugs and other things on the floor that can make you trip. ?What can I do in the kitchen? ?Clean up any spills right away. ?Avoid walking on wet floors. ?Keep items that you use a lot in easy-to-reach places. ?If you need to reach something above you, use a strong step stool that has a grab bar. ?Keep electrical cords out of the way. ?Do not use floor polish or wax that makes floors slippery. If you must use wax, use non-skid floor wax. ?Do not have throw rugs and other things on the floor that can make you trip. ?What can I do with my stairs? ?Do not leave any items on the stairs. ?Make sure that there are handrails on both sides of the stairs and use them. Fix handrails that are broken or loose. Make sure that handrails are as long as the stairways. ?Check any carpeting to make sure that it is firmly attached to the stairs. Fix any  carpet that is loose or worn. ?Avoid having throw rugs at the top or bottom of the stairs. If you do have throw rugs, attach them to the floor with carpet tape. ?Make sure that you have a light switch at the top of the stairs and the bottom of the stairs. If you do not have them, ask someone to add them for you. ?What else can I do to help prevent falls? ?Wear shoes that: ?Do not have high heels. ?Have rubber bottoms. ?Are comfortable and fit you well. ?Are closed at the toe. Do not wear sandals. ?If you use a stepladder: ?Make sure that it is fully opened. Do not climb a closed stepladder. ?Make sure that both sides of the stepladder are locked into place. ?Ask someone to hold it for you, if  possible. ?Clearly mark and make sure that you can see: ?Any grab bars or handrails. ?First and last steps. ?Where the edge of each step is. ?Use tools that help you move around (mobility aids) if they are nee

## 2021-09-14 NOTE — Progress Notes (Signed)
? ?Subjective:  ? Austin Faulkner is a 66 y.o. male who presents for Medicare Annual/Subsequent preventive examination. ? ?I connected with Austin Faulkner today by telephone and verified that I am speaking with the correct person using two identifiers. ?Location patient: home ?Location provider: work ?Persons participating in the virtual visit: patient, nurse.  ?  ?I discussed the limitations, risks, security and privacy concerns of performing an evaluation and management service by telephone and the availability of in person appointments. I also discussed with the patient that there may be a patient responsible charge related to this service. The patient expressed understanding and verbally consented to this telephonic visit.  ?  ?Interactive audio and video telecommunications were attempted between this provider and patient, however failed, due to patient having technical difficulties OR patient did not have access to video capability.  We continued and completed visit with audio only. ? ?Some vital signs may be absent or patient reported.  ? ?Time Spent with patient on telephone encounter: 30 minutes ? ? ?Review of Systems    ? ?Cardiac Risk Factors include: male gender;advanced age (>10mn, >>27women);hypertension ? ?   ?Objective:  ?  ?Today's Vitals  ? 09/14/21 1001 09/14/21 1002  ?Weight: 172 lb (78 kg)   ?Height: 5' 10"  (1.778 m)   ?PainSc:  3   ? ?Body mass index is 24.68 kg/m?. ? ? ?  09/14/2021  ? 10:07 AM 07/13/2021  ? 10:06 AM 06/28/2018  ? 12:14 AM 07/28/2017  ?  9:50 AM 03/05/2017  ? 12:29 PM 06/23/2016  ?  6:46 AM 06/22/2016  ?  8:14 AM  ?Advanced Directives  ?Does Patient Have a Medical Advance Directive? Yes Yes No No No Yes Yes  ?Type of [place]aramed[place]of [place] will [place] will    Living will;Healthcare Power of [place] will  ?Does patient want to make changes to medical advance directive?      No - Patient declined    ?Copy of [place] Chart? No - copy requested     No - copy requested No - copy requested  ?Would patient like information on creating a medical advance directive?    No - Patient declined No - Patient declined    ? ? ?Current Medications (verified) ?Outpatient Encounter Medications as of 09/14/2021  ?Medication Sig  ? acetaminophen (TYLENOL) 650 MG CR tablet Take 1,300 mg by mouth every 8 (eight) hours as needed for pain.  ? albuterol (VENTOLIN HFA) 108 (90 Base) MCG/ACT inhaler Inhale 2 puffs into the lungs every 6 (six) hours as needed for wheezing or shortness of breath.   ? Ascorbic Acid (VITAMIN C) 1000 MG tablet Take 1,000 mg by mouth daily.  ? cetirizine (ZYRTEC) 10 MG tablet Take 1 tablet (10 mg total) by mouth daily.  ? EPINEPHrine 0.3 mg/0.3 mL IJ SOAJ injection Inject 0.3 mg into the muscle as needed for anaphylaxis.  ? fluticasone (FLONASE) 50 MCG/ACT nasal spray Place 1-2 sprays into both nostrils daily as needed for allergies or rhinitis.  ? fluticasone-salmeterol (ADVAIR HFA) 115-21 MCG/ACT inhaler Inhale 2 puffs into the lungs 2 (two) times daily.  ? hydrochlorothiazide (HYDRODIURIL) 25 MG tablet Take 25 mg by mouth daily.  ? hydroxychloroquine (PLAQUENIL) 200 MG tablet Take 200 mg by mouth 2 (two) times daily.  ? ipratropium-albuterol (DUONEB) 0.5-2.5 (3) MG/3ML SOLN Take 3 mLs by nebulization every 6 (six) hours as needed.  ? meloxicam (MOBIC)  15 MG tablet Take 15 mg by mouth daily.  ? methylPREDNISolone (MEDROL DOSEPAK) 4 MG TBPK tablet Take 4 mg by mouth as needed (Breathing flair-up).  ? Multiple Vitamin (MULTIVITAMIN WITH MINERALS) TABS tablet Take 1 tablet by mouth daily.  ? omeprazole (PRILOSEC) 20 MG capsule Take 20 mg by mouth daily before breakfast.  ? PARoxetine (PAXIL) 40 MG tablet Take 20 mg by mouth at bedtime.  ? PRESCRIPTION MEDICATION 2 (two) times a week. Allergy injection  ? Probiotic Product (PROBIOTIC DAILY PO) Take 1 capsule by mouth daily before  breakfast. 100 billion  ? sulfaSALAzine (AZULFIDINE) 500 MG tablet Take 500 mg by mouth 2 (two) times daily.  ? tadalafil (CIALIS) 20 MG tablet Take 20 mg by mouth daily as needed for erectile dysfunction.  ? tamsulosin (FLOMAX) 0.4 MG CAPS capsule Take 0.4 mg by mouth at bedtime.  ? Tiotropium Bromide Monohydrate (SPIRIVA RESPIMAT) 1.25 MCG/ACT AERS Inhale 2 puffs into the lungs 2 (two) times daily.  ? traZODone (DESYREL) 100 MG tablet Take 100 mg by mouth at bedtime.  ? TURMERIC CURCUMIN PO Take 900 mg by mouth daily before breakfast.  ? ibuprofen (ADVIL) 800 MG tablet Take 1 tablet (800 mg total) by mouth every 8 (eight) hours as needed.  ? leflunomide (ARAVA) 20 MG tablet Take 20 mg by mouth at bedtime. (Patient not taking: Reported on 09/14/2021)  ? ?No facility-administered encounter medications on file as of 09/14/2021.  ? ? ?Allergies (verified) ?Tape, Azithromycin, and Sildenafil  ? ?History: ?Past Medical History:  ?Diagnosis Date  ? Asthma   ? COPD (chronic obstructive pulmonary disease) ([place])   ? Depression   ? PTSD  ? Diverticulitis   ? Diverticulitis of colon with perforation 03/04/2016  ? Dyspnea   ? secondary to seasonal allergies; or illness  ? GERD (gastroesophageal reflux disease)   ? Heart murmur   ? slight since birth  ? Hypertension   ? Pneumonia yrs ago  ? Rheumatoid arthritis ([place])   ? Seasonal allergies   ? Shoulder dislocation   ? ?Past Surgical History:  ?Procedure Laterality Date  ? INGUINAL HERNIA REPAIR Bilateral 07/23/2021  ? Procedure: LAPAROSCOPIC BILATERAL INGUINAL HERNIA REPAIR WITH MESH;  Surgeon: Kinsinger, Arta Bruce, MD;  Location: [facility];  Service: General;  Laterality: Bilateral;  ? knee arthroscopy and cartlidge repair Bilateral   ? 5 on right and 5 on left  ? SHOULDER ARTHROSCOPY Left 2008  ? SMALL INTESTINE SURGERY  2017  ? small intestine resection  ? TOTAL KNEE ARTHROPLASTY Bilateral 2002  ? 2010  ? ?Family History  ?Problem Relation Age of Onset  ? Lung cancer Mother   ?  Heart attack Father 2  ?     "healthier than I am"  ? Lupus Sister   ? Multiple myeloma Sister   ?     BMT at [facility]  ? Stroke Paternal Grandmother   ? Alpha-1 antitrypsin deficiency Neg Hx   ? ?Social History  ? ?Socioeconomic History  ? Marital status: Married  ?  Spouse name: Silva Bandy  ? Number of children: 3  ? Years of education: Not on file  ? Highest education level: Not on file  ?Occupational History  ? Occupation: retired  ?Tobacco Use  ? Smoking status: Former  ?  Types: Cigarettes, Cigars  ?  Quit date: 2019  ?  Years since quitting: 4.3  ?  Passive exposure: Yes  ? Smokeless tobacco: Never  ?Vaping Use  ? Vaping Use:  Never used  ?Substance and Sexual Activity  ? Alcohol use: No  ? Drug use: No  ? Sexual activity: Yes  ?  Partners: Female  ?Other Topics Concern  ? Not on file  ?Social History Narrative  ? Not on file  ? ?Social Determinants of Health  ? ?Financial Resource Strain: Low Risk   ? Difficulty of Paying Living Expenses: Not hard at all  ?Food Insecurity: No Food Insecurity  ? Worried About Charity fundraiser in the Last Year: Never true  ? Ran Out of Food in the Last Year: Never true  ?Transportation Needs: No Transportation Needs  ? Lack of Transportation (Medical): No  ? Lack of Transportation (Non-Medical): No  ?Physical Activity: Inactive  ? Days of Exercise per Week: 0 days  ? Minutes of Exercise per Session: 0 min  ?Stress: Stress Concern Present  ? Feeling of Stress : To some extent  ?Social Connections: Socially Integrated  ? Frequency of Communication with Friends and Family: More than three times a week  ? Frequency of Social Gatherings with Friends and Family: More than three times a week  ? Attends Religious Services: More than 4 times per year  ? Active Member of Clubs or Organizations: Yes  ? Attends Archivist Meetings: More than 4 times per year  ? Marital Status: Married  ? ? ?Tobacco Counseling ?Counseling given: Not Answered ? ? ?Clinical Intake: ? ?Pre-visit  preparation completed: Yes ? ?Pain : 0-10 ?Pain Score: 3  ?Pain Type: Chronic pain ?Pain Location: Hip (and joint pain-) ?Pain Onset: More than a month ago ?Pain Frequency: Constant ? ?  ? ?BMI - recorded: 24.68 ?Nu

## 2021-10-14 ENCOUNTER — Telehealth (HOSPITAL_BASED_OUTPATIENT_CLINIC_OR_DEPARTMENT_OTHER): Payer: Self-pay

## 2021-10-29 ENCOUNTER — Telehealth: Payer: Self-pay | Admitting: Family

## 2021-10-29 NOTE — Telephone Encounter (Signed)
There is a chest x-ray order and a CT chest order. He only needs to do the CT please.

## 2021-10-29 NOTE — Telephone Encounter (Signed)
Looks like he has a CT chest scheduled and maybe a chest xray ordered.

## 2021-10-29 NOTE — Telephone Encounter (Signed)
Patient states he has two CT scan coming up and is not sure that he needs both. He would like a call back to discuss canceling the one with [place]. Please advise.

## 2021-10-30 NOTE — Telephone Encounter (Signed)
Patient advised as indicated

## 2021-11-03 ENCOUNTER — Ambulatory Visit (HOSPITAL_BASED_OUTPATIENT_CLINIC_OR_DEPARTMENT_OTHER)
Admission: RE | Admit: 2021-11-03 | Discharge: 2021-11-03 | Disposition: A | Discharge status: Home / Self Care | Payer: No Typology Code available for payment source | Source: Ambulatory Visit | Attending: Internal Medicine | Admitting: Internal Medicine

## 2021-11-03 ENCOUNTER — Ambulatory Visit: Payer: Medicare Other | Admitting: Family

## 2021-11-03 DIAGNOSIS — R911 Solitary pulmonary nodule: Secondary | ICD-10-CM | POA: Diagnosis present

## 2021-11-10 ENCOUNTER — Encounter: Payer: Self-pay | Admitting: Internal Medicine

## 2021-11-10 ENCOUNTER — Ambulatory Visit (INDEPENDENT_AMBULATORY_CARE_PROVIDER_SITE_OTHER): Payer: No Typology Code available for payment source | Admitting: Internal Medicine

## 2021-11-10 VITALS — BP 120/76 | HR 83 | Temp 98.3°F | Ht 70.0 in | Wt 186.6 lb

## 2021-11-10 DIAGNOSIS — J301 Allergic rhinitis due to pollen: Secondary | ICD-10-CM | POA: Diagnosis not present

## 2021-11-10 DIAGNOSIS — J4540 Moderate persistent asthma, uncomplicated: Secondary | ICD-10-CM | POA: Diagnosis not present

## 2021-11-10 DIAGNOSIS — R911 Solitary pulmonary nodule: Secondary | ICD-10-CM

## 2021-11-10 NOTE — Progress Notes (Signed)
Austin Faulkner    [id number]    15-Mar-1956  Primary Care Physician:O'Sullivan, Lenna Sciara, NP Date of Appointment: 11/10/2021 Established Patient Visit  Chief complaint:   Chief Complaint  Patient presents with   Follow-up    SOB at times     HPI: Austin Faulkner is a 66 y.o. man, veteran, who presented with abnormal ct chest and asthma.   Interval Updates: Here for follow up after his repeat CT scan to follow up on a SPN. The nodule has since resolved. Suspect it was likely infectious or inflammatory.  Regarding his asthma, He has been following up with his allergy doctor at the [place] - takes a breathing treatment less than once/week. He does have some allergy symptoms. He was put on spiriva and advair in the last two years.  He thinks they seem to be working well for him. He has had breathing testing there too. He is also taking allergy shots weekly and feels better.   Not needed prednisone for his breathing for the last six months to a year. He feels his allergist at the [place] is taking good care of him.   I have reviewed the patient's family social and past medical history and updated as appropriate.   Past Medical History:  Diagnosis Date   Asthma    COPD (chronic obstructive pulmonary disease) ([place])    Depression    PTSD   Diverticulitis    Diverticulitis of colon with perforation 03/04/2016   Dyspnea    secondary to seasonal allergies; or illness   GERD (gastroesophageal reflux disease)    Heart murmur    slight since birth   Hypertension    Pneumonia yrs ago   Rheumatoid arthritis ([place])    Seasonal allergies    Shoulder dislocation     Past Surgical History:  Procedure Laterality Date   INGUINAL HERNIA REPAIR Bilateral 07/23/2021   Procedure: LAPAROSCOPIC BILATERAL INGUINAL HERNIA REPAIR WITH MESH;  Surgeon: Kinsinger, Arta Bruce, MD;  Location: [facility];  Service: General;  Laterality: Bilateral;   knee arthroscopy and cartlidge repair Bilateral    5  on right and 5 on left   SHOULDER ARTHROSCOPY Left 2008   SMALL INTESTINE SURGERY  2017   small intestine resection   TOTAL KNEE ARTHROPLASTY Bilateral 2002   2010    Family History  Problem Relation Age of Onset   Lung cancer Mother    Heart attack Father 86       "healthier than I am"   Lupus Sister    Multiple myeloma Sister        BMT at [facility]   Stroke Paternal Grandmother    Alpha-1 antitrypsin deficiency Neg Hx     Social History   Occupational History   Occupation: retired  Tobacco Use   Smoking status: Former    Types: Cigarettes, Cigars    Quit date: 2019    Years since quitting: 4.4    Passive exposure: Yes   Smokeless tobacco: Never  Vaping Use   Vaping Use: Never used  Substance and Sexual Activity   Alcohol use: No   Drug use: No   Sexual activity: Yes    Partners: Female     Physical Exam: Blood pressure 120/76, pulse 83, temperature 98.3 F (36.8 C), temperature source Oral, height 5' 10"  (1.778 m), weight 186 lb 9.6 oz (84.6 kg), SpO2 97 %.  Gen:      No acute distress  ENT:  no nasal polyps, mucus membranes moist Lungs:    No increased respiratory effort, symmetric chest wall excursion, clear to auscultation bilaterally, no wheezes or crackles CV:         Regular rate and rhythm; no murmurs, rubs, or gallops.  No pedal edema   Data Reviewed: Imaging: I have personally reviewed the CT Chest which shows mild emphysema   PFTs:   Labs:  Immunization status: Immunization History  Administered Date(s) Administered   Influenza Split 02/14/2012   Influenza Whole 04/07/2007   Influenza, Seasonal, Injecte, Preservative Fre 04/06/2010, 03/30/2011   Influenza,inj,Quad PF,6+ Mos 03/09/2018, 02/23/2019, 04/21/2021   Influenza-Unspecified 03/14/2002, 06/17/2003, 03/16/2004, 04/07/2007, 01/23/2012, 05/29/2014, 03/31/2015, 04/21/2021   Moderna Covid-19 Vaccine Bivalent Booster 60yr & up 03/31/2021   Moderna Sars-Covid-2 Vaccination 08/06/2019,  09/03/2019, 09/19/2020   PFIZER(Purple Top)SARS-COV-2 Vaccination 08/06/2019, 09/03/2019   Pneumococcal Polysaccharide-23 04/07/2007, 01/09/2020   Td 05/24/1996   Tdap 01/22/2010, 03/30/2011, 11/02/2021    External Records Personally Reviewed: internal medicine, general surgery  Assessment:  Moderate persistent asthma, with possible COPD overlap syndrome.  Solitary pulmonary nodule - resolved, likely infectious or inflammatory  Plan/Recommendations: [facility] spiriva [facility] advair [facility]. Also on SCIT Continue albuterol as needed, use is minimal.   No further follow up needed for essentially resolved nodule.    Return to Care: Return if symptoms worsen or fail to improve.   NLenice Llamas MD [facility]

## 2021-11-10 NOTE — Patient Instructions (Addendum)
I am happy to see you back as needed if anything changes about your breathing.

## 2022-01-07 ENCOUNTER — Other Ambulatory Visit: Payer: Self-pay

## 2022-01-07 NOTE — Patient Outreach (Signed)
  Care Coordination   01/07/2022 Name: Austin Faulkner MRN: [id number] DOB: 10-Apr-1956   Care Coordination Outreach Attempts:  An unsuccessful telephone outreach was attempted today to offer the patient information about available care coordination services as a benefit of their health plan.   Follow Up Plan:  Additional outreach attempts will be made to offer the patient care coordination information and services.   Encounter Outcome:  No Answer  Care Coordination Interventions Activated:  No   Care Coordination Interventions:  No, not indicated    Kathyrn Sheriff, RN, MSN, BSN, CCM Care Coordinator [phone]

## 2022-01-07 NOTE — Patient Outreach (Signed)
  Care Coordination   01/07/2022 Name: Austin Faulkner MRN: [id number] DOB: April 21, 1956   Care Coordination Outreach Attempts:  Contact was made with the patient today to offer care coordination services as a benefit of their health plan. The patient requested a return call on a later date.  Request a later time today.  Follow Up Plan:  Additional outreach attempts will be made to offer the patient care coordination information and services.   Encounter Outcome:  Pt. Request to Call Back  Care Coordination Interventions Activated:  No   Care Coordination Interventions:  No, not indicated    Kathyrn Sheriff, RN, MSN, BSN, CCM Care Coordinator [phone]

## 2022-02-12 ENCOUNTER — Emergency Department (HOSPITAL_BASED_OUTPATIENT_CLINIC_OR_DEPARTMENT_OTHER)
Admission: EM | Admit: 2022-02-12 | Discharge: 2022-02-12 | Disposition: A | Discharge status: Home / Self Care | Payer: No Typology Code available for payment source | Attending: Emergency Medicine | Admitting: Emergency Medicine

## 2022-02-12 ENCOUNTER — Emergency Department (HOSPITAL_BASED_OUTPATIENT_CLINIC_OR_DEPARTMENT_OTHER): Payer: No Typology Code available for payment source

## 2022-02-12 ENCOUNTER — Encounter: Payer: Self-pay | Admitting: *Deleted

## 2022-02-12 ENCOUNTER — Telehealth: Payer: Self-pay | Admitting: *Deleted

## 2022-02-12 ENCOUNTER — Other Ambulatory Visit: Payer: Self-pay

## 2022-02-12 ENCOUNTER — Encounter (HOSPITAL_BASED_OUTPATIENT_CLINIC_OR_DEPARTMENT_OTHER): Payer: Self-pay | Admitting: *Deleted

## 2022-02-12 DIAGNOSIS — Z79899 Other long term (current) drug therapy: Secondary | ICD-10-CM | POA: Insufficient documentation

## 2022-02-12 DIAGNOSIS — W01198A Fall on same level from slipping, tripping and stumbling with subsequent striking against other object, initial encounter: Secondary | ICD-10-CM | POA: Insufficient documentation

## 2022-02-12 DIAGNOSIS — Z7951 Long term (current) use of inhaled steroids: Secondary | ICD-10-CM | POA: Insufficient documentation

## 2022-02-12 DIAGNOSIS — Y92007 Garden or yard of unspecified non-institutional (private) residence as the place of occurrence of the external cause: Secondary | ICD-10-CM | POA: Insufficient documentation

## 2022-02-12 DIAGNOSIS — Z7952 Long term (current) use of systemic steroids: Secondary | ICD-10-CM | POA: Diagnosis not present

## 2022-02-12 DIAGNOSIS — S81811A Laceration without foreign body, right lower leg, initial encounter: Secondary | ICD-10-CM

## 2022-02-12 DIAGNOSIS — I10 Essential (primary) hypertension: Secondary | ICD-10-CM | POA: Diagnosis not present

## 2022-02-12 DIAGNOSIS — J449 Chronic obstructive pulmonary disease, unspecified: Secondary | ICD-10-CM | POA: Insufficient documentation

## 2022-02-12 MED ORDER — LIDOCAINE-EPINEPHRINE (PF) 2 %-1:200000 IJ SOLN
10.0000 mL | Freq: Once | INTRAMUSCULAR | Status: DC
  Filled 2022-02-12: qty 20

## 2022-02-12 NOTE — ED Notes (Signed)
Suture cart to bedside, wound site cleaned. Comfort measures provided

## 2022-02-12 NOTE — ED Provider Notes (Signed)
[facility] EMERGENCY DEPARTMENT [id number]  CSN: [id number] Arrival date & time: 02/12/22  1742     History  Chief Complaint  Patient presents with   Leg Injury    Austin Faulkner is a 66 y.o. male.  Patient with history of COPD, GERD, hypertension presents today with complaints of right lower leg laceration. He states that same occurred immediately prior to arrival today when he was working in his yard and a piece of wood fell off a trailer and struck him in his right shin. He has been ambulatory since the event. He is not anticoagulated. Last tdap less than 5 years ago.  The history is provided by the patient. No language interpreter was used.       Home Medications Prior to Admission medications   Medication Sig Start Date End Date Taking? Authorizing Provider  acetaminophen (TYLENOL) 650 MG CR tablet Take 1,300 mg by mouth every 8 (eight) hours as needed for pain.    [provider]  albuterol (VENTOLIN HFA) 108 (90 Base) MCG/ACT inhaler Inhale 2 puffs into the lungs every 6 (six) hours as needed for wheezing or shortness of breath.     [provider]  Ascorbic Acid (VITAMIN C) 1000 MG tablet Take 1,000 mg by mouth daily.    [provider]  cetirizine (ZYRTEC) 10 MG tablet Take 1 tablet (10 mg total) by mouth daily. 04/25/19   Sandford Craze, NP  EPINEPHrine 0.3 mg/0.3 mL IJ SOAJ injection Inject 0.3 mg into the muscle as needed for anaphylaxis. 01/07/21   [provider]  fluticasone (FLONASE) 50 MCG/ACT nasal spray Place 1-2 sprays into both nostrils daily as needed for allergies or rhinitis.    [provider]  fluticasone-salmeterol (ADVAIR HFA) 115-21 MCG/ACT inhaler Inhale 2 puffs into the lungs 2 (two) times daily.    [provider]  hydrochlorothiazide (HYDRODIURIL) 25 MG tablet Take 25 mg by mouth daily. 01/05/21   [provider]  hydroxychloroquine (PLAQUENIL) 200 MG tablet Take 200  mg by mouth 2 (two) times daily.    [provider]  ibuprofen (ADVIL) 800 MG tablet Take 1 tablet (800 mg total) by mouth every 8 (eight) hours as needed. 07/23/21   Kinsinger, De Blanch, MD  ipratropium-albuterol (DUONEB) 0.5-2.5 (3) MG/3ML SOLN Take 3 mLs by nebulization every 6 (six) hours as needed. 04/25/19   Sandford Craze, NP  leflunomide (ARAVA) 20 MG tablet Take 20 mg by mouth at bedtime. 05/28/21   [provider]  meloxicam (MOBIC) 15 MG tablet Take 15 mg by mouth daily. 11/11/20   [provider]  methotrexate (RHEUMATREX) 2.5 MG tablet TAKE THREE TABLETS BY MOUTH EVERY 7 DAYS RHEUMATOID ARTHRITIS 09/15/21   [provider]  methylPREDNISolone (MEDROL DOSEPAK) 4 MG TBPK tablet Take 4 mg by mouth as needed (Breathing flair-up).    [provider]  Multiple Vitamin (MULTIVITAMIN WITH MINERALS) TABS tablet Take 1 tablet by mouth daily.    [provider]  omeprazole (PRILOSEC) 20 MG capsule Take 20 mg by mouth daily before breakfast.    [provider]  PARoxetine (PAXIL) 40 MG tablet Take 20 mg by mouth at bedtime.    [provider]  PRESCRIPTION MEDICATION 2 (two) times a week. Allergy injection    [provider]  Probiotic Product (PROBIOTIC DAILY PO) Take 1 capsule by mouth daily before breakfast. 100 billion    [provider]  sulfaSALAzine (AZULFIDINE) 500 MG tablet Take  500 mg by mouth 2 (two) times daily. 04/12/21   [provider]  tadalafil (CIALIS) 20 MG tablet Take 20 mg by mouth daily as needed for erectile dysfunction. 05/04/17   [provider]  tamsulosin (FLOMAX) 0.4 MG CAPS capsule Take 0.4 mg by mouth at bedtime.    [provider]  Tiotropium Bromide Monohydrate (SPIRIVA RESPIMAT) 1.25 MCG/ACT AERS Inhale 2 puffs into the lungs 2 (two) times daily. 04/25/19   [name], NP  traZODone (DESYREL) 100 MG tablet Take 100 mg by mouth at bedtime.     [provider]  TURMERIC CURCUMIN PO Take 900 mg by mouth daily before breakfast.    [provider]      Allergies    Tape, Azithromycin, and Sildenafil    Review of Systems   Review of Systems  Skin:  Positive for wound.  All other systems reviewed and are negative.   Physical Exam Updated Vital Signs BP 131/89 (BP Location: Right Arm)   Pulse 86   Temp 98.3 F (36.8 C) (Oral)   Resp 18   SpO2 98%  Physical Exam Vitals and nursing note reviewed.  Constitutional:      General: He is not in acute distress.    Appearance: Normal appearance. He is normal weight. He is not ill-appearing, toxic-appearing or diaphoretic.  HENT:     Head: Normocephalic and atraumatic.  Cardiovascular:     Rate and Rhythm: Normal rate.  Pulmonary:     Effort: Pulmonary effort is normal. No respiratory distress.  Musculoskeletal:        General: Normal range of motion.     Cervical back: Normal range of motion.  Skin:    General: Skin is warm and dry.     Comments: Right anterior shin with 6 cm V shaped laceration present. No active bleeding. No crepitus or deformity. DP and Pt pulses intact and 2+. Capillary refill less than 2 seconds  Neurological:     General: No focal deficit present.     Mental Status: He is alert.  Psychiatric:        Mood and Affect: Mood normal.        Behavior: Behavior normal.    ED Results / Procedures / Treatments   Labs (all labs ordered are listed, but only abnormal results are displayed) Labs Reviewed - No data to display  EKG None  Radiology No results found.  Procedures .Marland KitchenLaceration Repair  Date/Time: 02/12/2022 7:22 PM  Performed by: Bud Face, PA-C Authorized by: Bud Face, PA-C   Consent:    Consent obtained:  Verbal   Consent given by:  Patient   Risks, benefits, and alternatives were discussed: yes     Risks discussed:  Infection, need for additional repair, nerve damage, poor wound healing, vascular damage,  tendon damage, retained foreign body, pain and poor cosmetic result   Alternatives discussed:  No treatment, delayed treatment, observation and referral Universal protocol:    Procedure explained and questions answered to patient or proxy's satisfaction: yes     Imaging studies available: yes     Patient identity confirmed:  Verbally with patient Anesthesia:    Anesthesia method:  Local infiltration   Local anesthetic:  Lidocaine 2% WITH epi Laceration details:    Location:  Leg   Leg location:  R lower leg   Length (cm):  6   Depth (mm):  4 Exploration:    Hemostasis achieved with:  Direct pressure  Imaging obtained: x-ray     Imaging outcome: foreign body not noted     Wound exploration: wound explored through full range of motion and entire depth of wound visualized     Contaminated: yes   Treatment:    Area cleansed with:  Povidone-iodine   Amount of cleaning:  Standard   Irrigation solution:  Sterile saline   Irrigation volume:  500 ml Skin repair:    Repair method:  Sutures   Suture size:  4-0   Suture material:  Prolene   Suture technique:  Simple interrupted   Number of sutures:  12 Approximation:    Approximation:  Close Repair type:    Repair type:  Simple Post-procedure details:    Dressing:  Antibiotic ointment and non-adherent dressing   Procedure completion:  Tolerated well, no immediate complications     Medications Ordered in ED Medications  lidocaine-EPINEPHrine (XYLOCAINE W/EPI) 2 %-1:200000 (PF) injection 10 mL (has no administration in time range)    ED Course/ Medical Decision Making/ A&P                           Medical Decision Making Amount and/or Complexity of Data Reviewed Radiology: ordered.  Risk Prescription drug management.   Patient presents today with complaints of right lower leg laceration. X-ray imaging obtained which did not show any obvious foreign bodies. I have personally reviewed and interpreted this imaging and agree  with radiology interpretation. Pressure irrigation performed. Wound explored and base of wound visualized in a bloodless field without evidence of foreign body.  Laceration occurred < 8 hours prior to repair which was well tolerated. Tdap up-to-date.  Pt has no comorbidities to effect normal wound healing. Pt discharged without antibiotics.  Discussed suture home care with patient and answered questions. Pt to follow-up for wound check and suture removal in 7 days; they are to return to the ED sooner for signs of infection. Pt is hemodynamically stable with no complaints prior to dc.    Final Clinical Impression(s) / ED Diagnoses Final diagnoses:  Leg laceration, right, initial encounter    Rx / DC Orders ED Discharge Orders     None     An After Visit Summary was printed and given to the patient.     Silva Bandy, PA-C 02/12/22 1926    Sloan Leiter, DO 02/16/22 [phone]

## 2022-02-12 NOTE — Patient Outreach (Signed)
  Care Coordination   Initial Visit Note   02/12/2022 Name: Austin Faulkner MRN: [id number] DOB: 06/16/1955  Austin Faulkner is a 66 y.o. year old male who sees [name], NP for primary care. I spoke with  Carita Pian by phone today.  What matters to the patients health and wellness today?  "Everything is going really good for me, despite all the health issues I have had recently-- I did fine after my recent surgeries and I am healing up; I have been followed closely by my specialists and by the [facility], [facility], [facility] doctors.  I went to PT after my hip replacement and I am not having any problems.  I plan to get my vaccines for COVID booster, flu, and RSV soon and I will update Melissa when I do.... I will schedule an appointment with her and bring all of my updated records from the [place] when I see her so she can add to her records"  No further or ongoing care coordination needs identified    Goals Addressed             This Visit's Progress    COMPLETED: Care Coordination Activities: No follow up required   On track    Care Coordination Interventions: Evaluation of current treatment plan related to recent surgeries for hip replacement/ inguinal hernia  and patient's adherence to plan as established by provider Advised patient to provide appropriate vaccination information to provider or CM team member at next visit Reviewed scheduled/upcoming provider appointments including none- reminded patient that PCP had recommended next office visit in June 2023- he states he will scheduled accordingly- has mainly been attending PCP visits through [place] determinant of health barriers Reminded patient to obtain flu, updated COVID, and RSV vaccines for 2023-24 winter/ flu season- he is aware and planning to do promptly; confirmed patient attended medicare Annual Wellness visit 09/14/21           SDOH assessments and interventions completed:  Yes  SDOH Interventions Today    Flowsheet  Row Most Recent Value  SDOH Interventions   Food Insecurity Interventions Intervention Not Indicated  Transportation Interventions Intervention Not Indicated  [patient drives self,  wife assists as indicated/ needed]       Care Coordination Interventions Activated:  Yes  Care Coordination Interventions:  Yes, provided   Follow up plan: No further intervention required.   Encounter Outcome:  Pt. Visit Completed   Oneta Rack, RN, BSN, CCRN Alumnus RN CM Care Coordination/ Transition of [place] Management [phone]: direct office

## 2022-02-12 NOTE — Discharge Instructions (Addendum)
Follow-up with you pcp for removal of your sutures in 1 week.  Return if development of any signs of infection or any new or worsening symptoms.

## 2022-02-12 NOTE — ED Triage Notes (Signed)
Laceration to RLE, working in the yard, a piece of wood fell off trailer, V shape cut, approx 6cm in length, no active bleeding noted at this time. NML CMS of RLE

## 2022-02-12 NOTE — Patient Instructions (Signed)
Visit Information  Thank you for taking time to visit with me today. Please don't hesitate to contact me if I can be of assistance to you.   Following are the goals we discussed today:   Goals Addressed             This Visit's Progress    COMPLETED: Care Coordination Activities: No follow up required   On track    Care Coordination Interventions: Evaluation of current treatment plan related to recent surgeries for hip replacement/ inguinal hernia  and patient's adherence to plan as established by provider Advised patient to provide appropriate vaccination information to provider or CM team member at next visit Reviewed scheduled/upcoming provider appointments including none- reminded patient that PCP had recommended next office visit in June 2023- he states he will scheduled accordingly- has mainly been attending PCP visits through [place] determinant of health barriers Reminded patient to obtain flu, updated COVID, and RSV vaccines for 2023-24 winter/ flu season- he is aware and planning to do promptly; confirmed patient attended medicare Annual Wellness visit 09/14/21           If you are experiencing a Mental Health or [place] or need someone to talk to, please  call the Suicide and Crisis Lifeline: 988 call the [place] National Suicide Prevention Lifeline: 321 267 7345 or TTY: (907) 445-5711 TTY 404-399-3737) to talk to a trained counselor call 1-800-273-TALK (toll free, 24 hour hotline) go to [facility] [address] [place] [phone]) call the [place]: 6295448481 call 911   Patient verbalizes understanding of instructions and care plan provided today and agrees to view in [place]. Active MyChart status and patient understanding of how to access instructions and care plan via MyChart confirmed with patient.     No further follow up required: no further/ ongoing care coordination  needs identified  Oneta Rack, RN, BSN, CCRN Alumnus RN CM Care Coordination/ Transition of [place] Management [phone]: direct office

## 2022-06-08 ENCOUNTER — Telehealth: Payer: Self-pay

## 2022-06-08 NOTE — Patient Outreach (Signed)
  Care Coordination   Initial Visit Note   06/08/2022 Name: JASHUA KNAAK MRN: [id number] DOB: 07-27-1955  STEVIN BIELINSKI is a 67 y.o. year old male who sees [name], NP for primary care. I spoke with  Carita Pian by phone today.  What matters to the patients health and wellness today?  Patient states he received care from the [place]. He denies any care coordination, resource, or disease management needs at this time. Mr. Okerlund to contact care coordinator if needs change in the future.    Goals Addressed             This Visit's Progress    COMPLETED: Care Coordination Activities       Care Coordination Interventions: Discussed care coordination program Encouraged Mr. Albus to contact care coordination and/or primary care provider if care coordination needs in the future. Confirmed patient has contact number.       SDOH assessments and interventions completed:  Yes  SDOH Interventions Today    Flowsheet Row Most Recent Value  SDOH Interventions   Food Insecurity Interventions Intervention Not Indicated  Housing Interventions Intervention Not Indicated  Transportation Interventions Intervention Not Indicated  Utilities Interventions Intervention Not Indicated     Care Coordination Interventions:  Yes, provided   Follow up plan: No further intervention required.   Encounter Outcome:  Pt. Visit Completed   Thea Silversmith, RN, MSN, BSN, [place] Coordinator [phone]

## 2022-08-18 IMAGING — CT CT CHEST W/O CM
2 of 3 series · 15 of 36 positions shown, 18 images · non-contrast
Comparison: Chest CT 03/19/2021, images available but no report.

CLINICAL DATA: Follow-up pulmonary nodule. 1.7 cm ground-glass
opacity in left upper lobe. Six-month follow-up.



[Series 2: thorax · axial · 0.83mm/px · z∈[-315,-73]mm · 12 of 143 slices shown, 15 images]
[im 11/143  mediastinal]
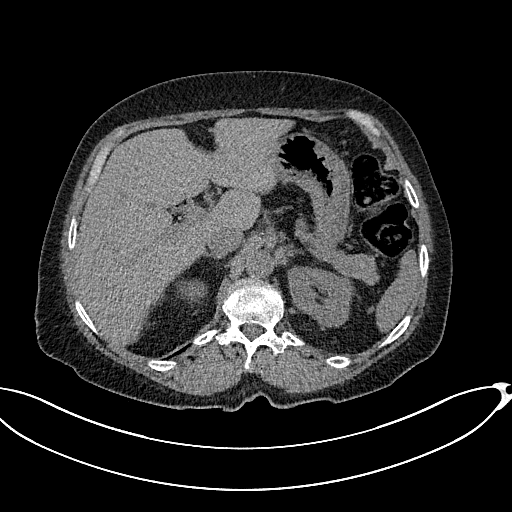
[im 11/143  lung]
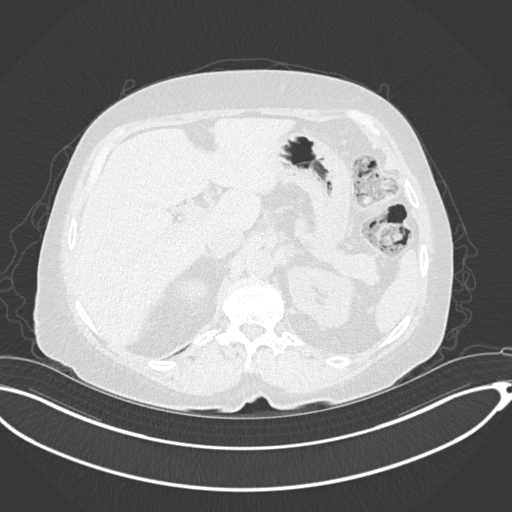
[im 22/143  lung]
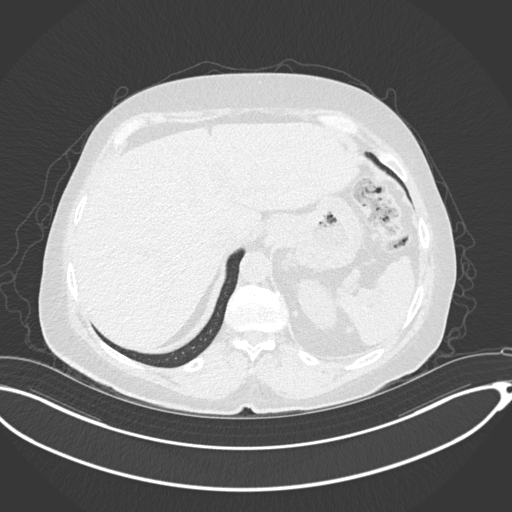
[im 32/143  lung]
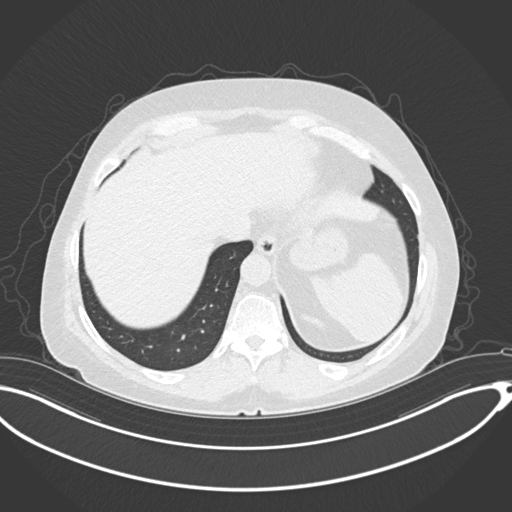
[im 43/143  lung]
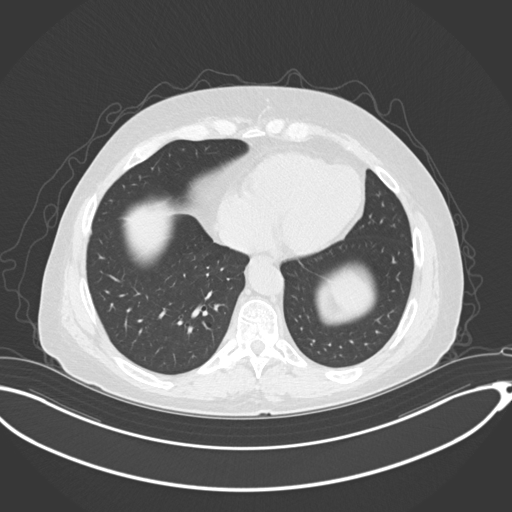
[im 53/143  mediastinal]
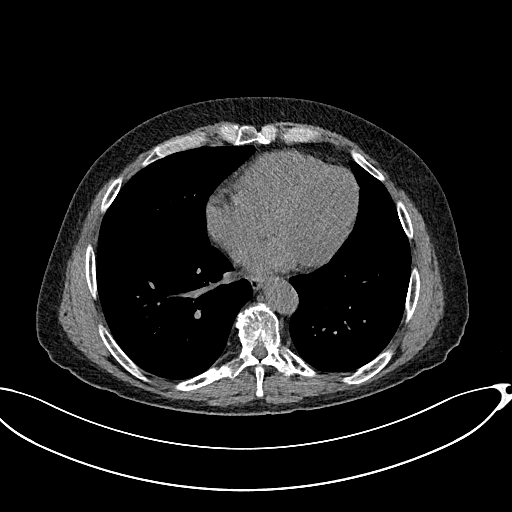
[im 53/143  lung]
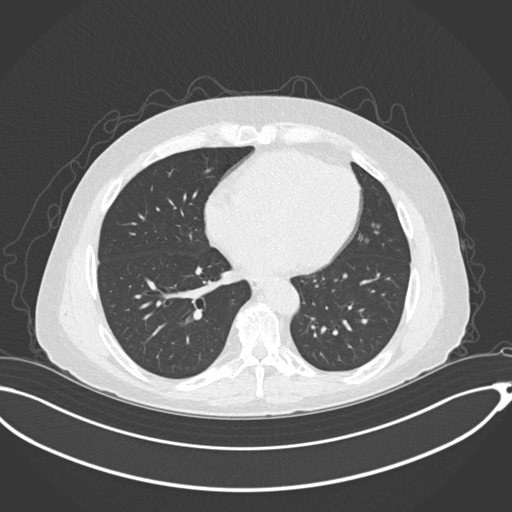
[im 64/143  lung]
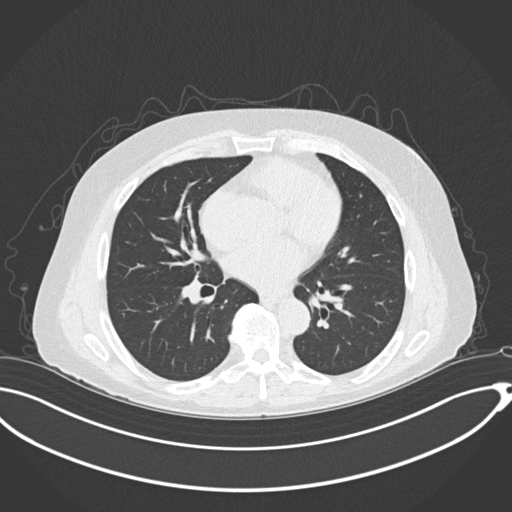
[im 79/143  lung]
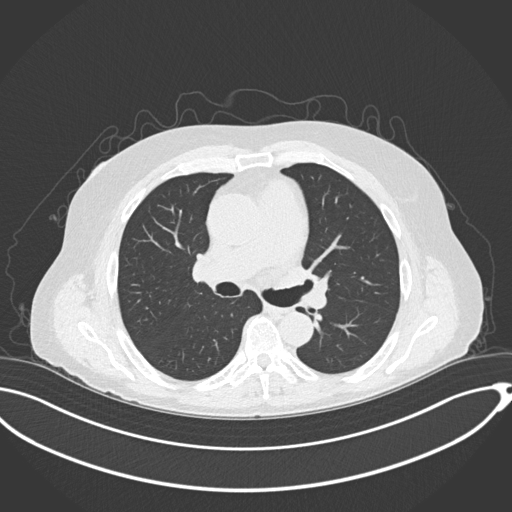
[im 90/143  lung]
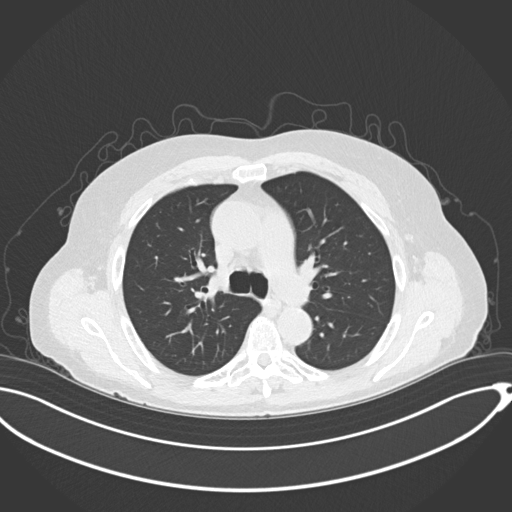
[im 100/143  mediastinal]
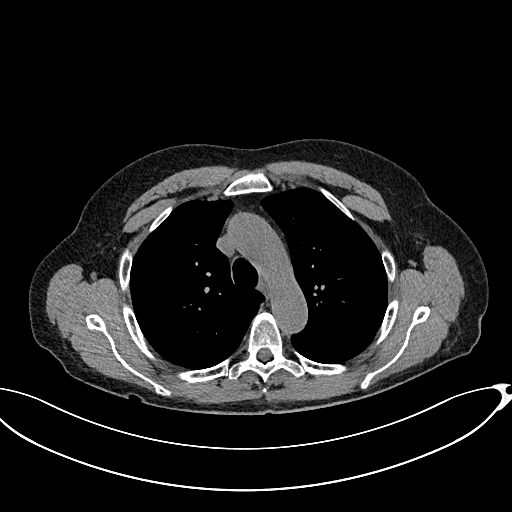
[im 100/143  lung]
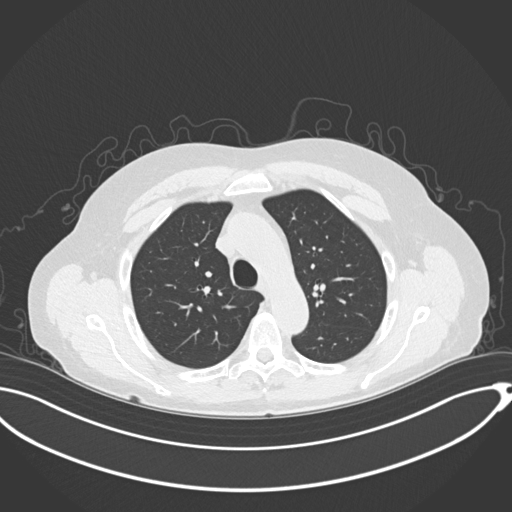
[im 111/143  lung]
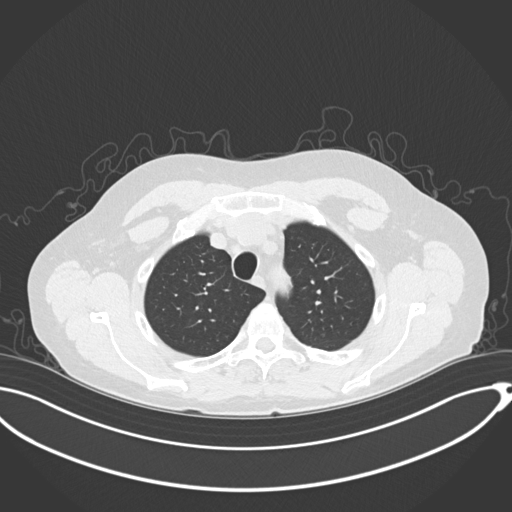
[im 121/143  lung]
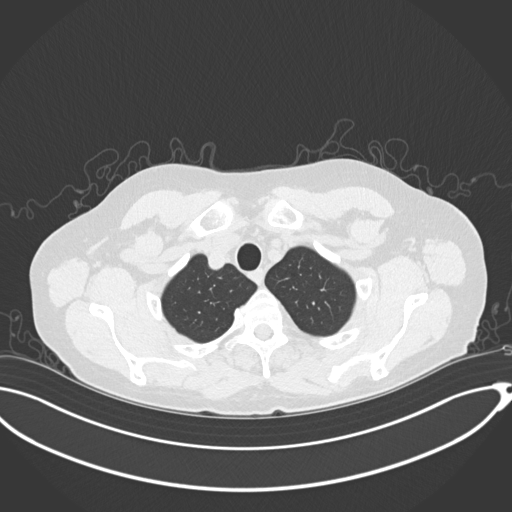
[im 132/143  lung]
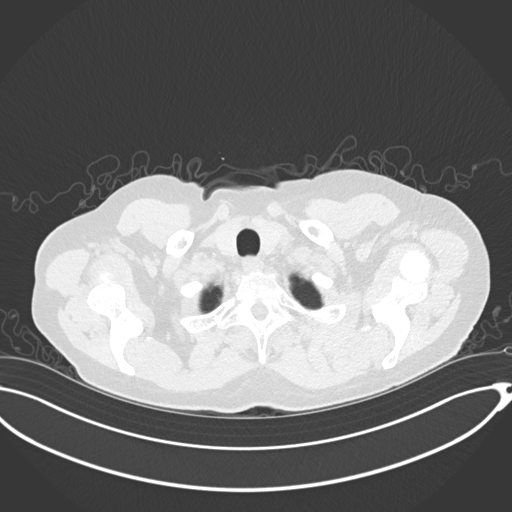

[Series 5: coronal · coronal · 0.59mm/px · 3 of 138 slices shown]
[im 28/138  lung]
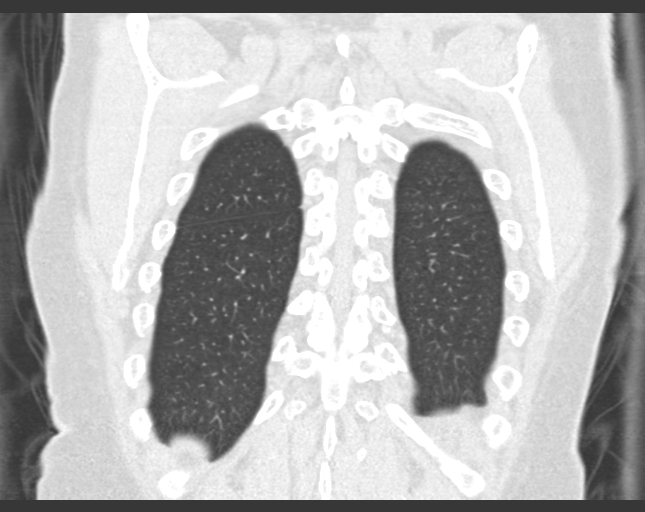
[im 55/138  lung]
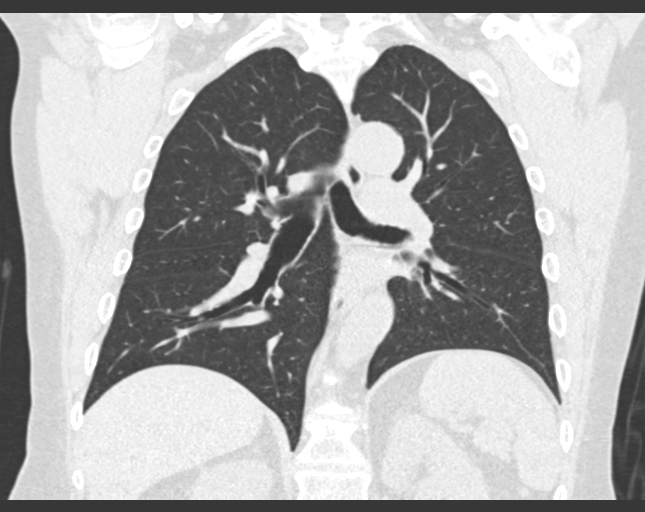
[im 83/138  lung]
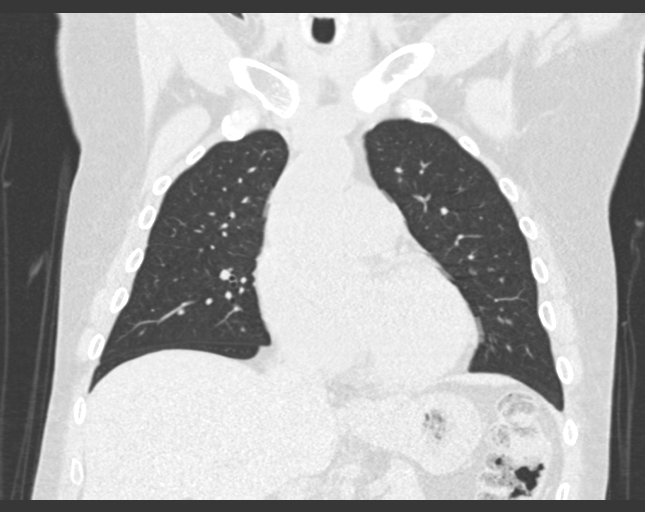

[15 of 36 positions shown; findings below may reference images not displayed]

FINDINGS: Cardiovascular: The thoracic aorta is normal in caliber. Dilated
main pulmonary artery 3.8 cm. The heart is normal in size. No
pericardial effusion.

Mediastinum/Nodes: No enlarged mediastinal lymph nodes. No bulky
hilar adenopathy. The esophagus is decompressed. No visualized
thyroid nodule.

Lungs/Pleura: The previous left upper lobe ground-glass area of
nodularity has resolved. There is no residual ground-glass or solid
component. No additional pulmonary nodules. No focal airspace
disease, pneumothorax, pleural effusion or pulmonary edema. There is
minimal upper lobe predominant emphysema.

Upper Abdomen: Layering gallstones.  No acute findings.

Musculoskeletal: Thoracic spondylosis with anterior spurring. There
are no acute or suspicious osseous abnormalities.
IMPRESSION: 1. The previous left upper lobe ground-glass area of nodularity has
resolved. There is no residual ground-glass or solid component. No
further follow-up is needed.
2. Dilated main pulmonary artery, can be seen with pulmonary
arterial hypertension.
3. Minor emphysema.
4. Incidental cholelithiasis in the upper abdomen.

Aortic Atherosclerosis (G4JM3-78K.K) and Emphysema ([id number].F).

## 2022-09-06 ENCOUNTER — Telehealth: Payer: Self-pay | Admitting: Family

## 2022-09-06 NOTE — Telephone Encounter (Signed)
Copied from CRM [id number]. Topic: Medicare AWV >> Sep 06, 2022 10:41 AM Payton Doughty wrote: Reason for CRM: Called patient to schedule Medicare Annual Wellness Visit (AWV). Left message for patient to call back and schedule Medicare Annual Wellness Visit (AWV).  Last date of AWV: 09/14/21  Please schedule an appointment at any time with Donne Anon, CMA  .  If any questions, please contact me.  Thank you ,  Verlee Rossetti; Care Guide Ambulatory Clinical Support [facility] Direct Dial: [phone]

## 2023-01-06 ENCOUNTER — Other Ambulatory Visit: Payer: Self-pay

## 2023-01-06 ENCOUNTER — Ambulatory Visit (INDEPENDENT_AMBULATORY_CARE_PROVIDER_SITE_OTHER): Payer: No Typology Code available for payment source | Admitting: Allergy & Immunology

## 2023-01-06 ENCOUNTER — Encounter: Payer: Self-pay | Admitting: Allergy & Immunology

## 2023-01-06 VITALS — BP 126/76 | HR 68 | Temp 98.0°F | Resp 16 | Ht 66.0 in | Wt 182.5 lb

## 2023-01-06 DIAGNOSIS — J3089 Other allergic rhinitis: Secondary | ICD-10-CM | POA: Diagnosis not present

## 2023-01-06 DIAGNOSIS — J4540 Moderate persistent asthma, uncomplicated: Secondary | ICD-10-CM

## 2023-01-06 DIAGNOSIS — R911 Solitary pulmonary nodule: Secondary | ICD-10-CM

## 2023-01-06 DIAGNOSIS — J302 Other seasonal allergic rhinitis: Secondary | ICD-10-CM | POA: Diagnosis not present

## 2023-01-06 MED ORDER — ALBUTEROL SULFATE HFA 108 (90 BASE) MCG/ACT IN AERS: 2.0000 | INHALATION_SPRAY | RESPIRATORY_TRACT | 1 refills | Status: AC | PRN

## 2023-01-06 MED ORDER — SPACER/AERO-HOLDING CHAMBERS DEVI: 1.0000 | 1 refills | Status: AC

## 2023-01-06 MED ORDER — FLUTICASONE PROPIONATE 50 MCG/ACT NA SUSP: 2.0000 | Freq: Every day | NASAL | 1 refills | Status: DC | PRN

## 2023-01-06 MED ORDER — CETIRIZINE HCL 10 MG PO TABS: 10.0000 mg | ORAL_TABLET | Freq: Every day | ORAL | 1 refills | Status: AC | PRN

## 2023-01-06 MED ORDER — FLUTICASONE-SALMETEROL 115-21 MCG/ACT IN AERO: 2.0000 | INHALATION_SPRAY | Freq: Two times a day (BID) | RESPIRATORY_TRACT | 1 refills | Status: AC

## 2023-01-06 NOTE — Progress Notes (Signed)
NEW PATIENT  Date of Service/Encounter:  01/06/23  Consult requested by: Sandford Craze, NP   Assessment:   Moderate persistent asthma, uncomplicated  Seasonal and perennial allergic rhinitis (grasses, ragweed, weeds, trees, molds, dust mites, cockroach, cat, dog) - previously on allergen immunotherapy, although it was with an ENT physician  [employer] in the [employer] from 1977 through 1998  Pulmonary nodule - followed by Dr. Celine Mans  Plan/Recommendations:   1. Seasonal and perennial allergic rhinitis - We will start allergy shots and be a bit more aggressive with increasing the dose since you have been on shots for some time now. - But we tend to make them stronger than ENT practices, so I do want to be fairly careful. - Make an appointment to start in 2-3 weeks in [place]. - Continue with your cetirizine and your Flonase daily.  2. Moderate persistent asthma, uncomplicated - Lung testing looks great today. - We are not going to make any medication changes. - Spacer use reviewed. - Daily controller medication(s): Advair 115/68mcg two puffs twice daily with spacer - Prior to physical activity: albuterol 2 puffs 10-15 minutes before physical activity. - Rescue medications: albuterol 4 puffs every 4-6 hours as needed - Asthma control goals:  * Full participation in all desired activities (may need albuterol before activity) * Albuterol use two time or less a week on average (not counting use with activity) * Cough interfering with sleep two time or less a month * Oral steroids no more than once a year * No hospitalizations  3. Return in about 6 months (around 07/09/2023). You can have the follow up appointment with Dr. Dellis Anes or a Nurse Practicioner (our Nurse Practitioners are excellent and always have Physician oversight!).     This note in its entirety was forwarded to the Provider who requested this consultation.  Subjective:   Austin Faulkner is a 67 y.o.  male presenting today for evaluation of  Chief Complaint  Patient presents with   Allergic Rhinitis     Transferring from [place].    Immunotherapy    Started injections about two years ago.    Asthma    Says he is well. Using three inhaler and uses a nebulizer during flare ups.     Austin Faulkner has a history of the following: Patient Active Problem List   Diagnosis Date Noted   Preoperative examination 07/17/2021   Anemia 07/17/2021   Weight loss 04/21/2021   Cervical radiculopathy 04/21/2021   Bilateral inguinal hernia without obstruction or gangrene 04/21/2021   Depression 04/26/2019   Chronic obstructive pulmonary disease (HCC) 04/26/2019   Benign prostatic hyperplasia 04/26/2019   Diverticular disease 06/23/2016   Essential hypertension 03/05/2016   Rheumatoid arthritis of multiple sites without rheumatoid factor (HCC) 03/05/2016   External hemorrhoid, bleeding 02/14/2012   GERD (gastroesophageal reflux disease) 02/14/2012   Chronic back pain 07/03/2010   TESTICULAR MASS, LEFT 02/25/2010   DEGENERATIVE JOINT DISEASE 11/13/2009   SYSTOLIC MURMUR 11/11/2009   Moderate persistent asthma, uncomplicated 07/21/2009    History obtained from: chart review and patient.  Austin Faulkner was referred by Sandford Craze, NP.     Austin Faulkner is a 67 y.o. male presenting for an evaluation of allergic rhinitis .   He tells me that two years ago, he has done well. He is very active in [employer]. Around 3 years ago, he saw Dr. Leta Baptist the allergist at the [facility]. He did testing there and he was positive to  the entire panel. He was having problems with breathing during the fall season. He ws allergic to everything in his back yard. He has learned to go out with caution and he monitors and air quality. He was staying there for 9 hours per day, but now he is there for two hours per day.  He was going to an ENT practice in [place] and was getting shots. It was  recommended that he do five years of this. Two weeks ago, things started getting "rocky" over there and he was sent to [facility] instead.  He tells me that his physician at the [place] office moved to [place] instead.    When his new allergist at the [place] found out about that, she decided to change things around.  She was also not happy that he was being seen by an ENT rather than a board-certified allergist.  Therefore it was recommended that he change community allergist.    Asthma/Respiratory Symptom History: Asthma is under good control.  He is currently on Advair 115/21 two puffs BID and Spiriva 2 puffs once a day.Marland Kitchen He has been on prednisone in the past, but this was years ago. He did have an exacerbation early in COVID19 pandemic and otherwise he has been fine.  The Advair is working very well for him.  He is happy with how he is doing.  Allergic Rhinitis Symptom History: He stopped the Flonase and the cetirizine.  He has had improvement in his symptoms with the allergy shots. We do not have hte details of the contents of his shots, but we do have his testing from the [place] (blood work). His last shot was two weeks ago.  He has symptoms throughout the year.  He had an IgE panel done in April 2022 that showed sensitization to dust mites, cat, dog, [place] grass, [place] grass, Johnson grass, cockroach, penicillium, Cladosporium, Aspergillus, Alternaria, maple, birch, cedar, oak, elm, Cottonwood, Hickory, mulberry, ragweed, and sheep sorrell.  He was receiving 2 injections.  He does not have access to his vials.  He is fine with [place] just remaking them.  He does not get sinus infections or any other infections very often.  Otherwise, there is no history of other atopic diseases, including drug allergies, stinging insect allergies, or contact dermatitis. There is no significant infectious history. Vaccinations are up to date.    Past Medical History: Patient Active Problem List    Diagnosis Date Noted   Preoperative examination 07/17/2021   Anemia 07/17/2021   Weight loss 04/21/2021   Cervical radiculopathy 04/21/2021   Bilateral inguinal hernia without obstruction or gangrene 04/21/2021   Depression 04/26/2019   Chronic obstructive pulmonary disease (HCC) 04/26/2019   Benign prostatic hyperplasia 04/26/2019   Diverticular disease 06/23/2016   Essential hypertension 03/05/2016   Rheumatoid arthritis of multiple sites without rheumatoid factor (HCC) 03/05/2016   External hemorrhoid, bleeding 02/14/2012   GERD (gastroesophageal reflux disease) 02/14/2012   Chronic back pain 07/03/2010   TESTICULAR MASS, LEFT 02/25/2010   DEGENERATIVE JOINT DISEASE 11/13/2009   SYSTOLIC MURMUR 11/11/2009   Moderate persistent asthma, uncomplicated 07/21/2009    Medication List:  Allergies as of 01/06/2023       Reactions   Azithromycin Other (See Comments)   Patient reports does not help with infections.   Sildenafil Other (See Comments)   Other reaction(s): Headache, Visual disturbance, Other (See Comments) Viagra, doesn't help Other reaction(s): Headache, Pain in eye Other Reaction(s): Other (See Comments)   Tape Rash   Must  use paper tape Prefers paper tape, others bruise skin        Medication List        Accurate as of January 06, 2023 11:59 PM. If you have any questions, ask your nurse or doctor.          STOP taking these medications    acetaminophen 650 MG CR tablet Commonly known as: TYLENOL Stopped by: Alfonse Spruce   ibuprofen 800 MG tablet Commonly known as: ADVIL Stopped by: Alfonse Spruce   methylPREDNISolone 4 MG Tbpk tablet Commonly known as: MEDROL DOSEPAK Stopped by: Alfonse Spruce   vitamin C 1000 MG tablet Stopped by: Alfonse Spruce       TAKE these medications    albuterol 108 (90 Base) MCG/ACT inhaler Commonly known as: VENTOLIN HFA Inhale 2 puffs into the lungs every 4 (four) hours as needed for  wheezing or shortness of breath. What changed: when to take this Changed by: Alfonse Spruce   cetirizine 10 MG tablet Commonly known as: ZYRTEC Take 1 tablet (10 mg total) by mouth daily as needed for allergies (Can take an extra dose during flare ups.). What changed:  when to take this reasons to take this Changed by: Alfonse Spruce   EPINEPHrine 0.3 mg/0.3 mL Soaj injection Commonly known as: EPI-PEN Inject 0.3 mg into the muscle as needed for anaphylaxis.   ferrous gluconate 324 MG tablet Commonly known as: FERGON Take 324 mg by mouth at bedtime.   fluocinonide cream 0.05 % Commonly known as: LIDEX Apply 1 Application topically as needed.   fluticasone 50 MCG/ACT nasal spray Commonly known as: FLONASE Place 2 sprays into both nostrils daily as needed for allergies or rhinitis. What changed: how much to take Changed by: Alfonse Spruce   fluticasone-salmeterol [phone] MCG/ACT inhaler Commonly known as: ADVAIR HFA Inhale 2 puffs into the lungs 2 (two) times daily.   folic acid 1 MG tablet Commonly known as: FOLVITE Take 1 tablet by mouth daily.   hydrochlorothiazide 25 MG tablet Commonly known as: HYDRODIURIL Take 25 mg by mouth daily.   hydroxychloroquine 200 MG tablet Commonly known as: PLAQUENIL Take 200 mg by mouth 2 (two) times daily.   ipratropium-albuterol 0.5-2.5 (3) MG/3ML Soln Commonly known as: DUONEB Take 3 mLs by nebulization every 6 (six) hours as needed.   ketotifen 0.035 % ophthalmic solution Commonly known as: ZADITOR Place 1 drop into both eyes 2 (two) times daily.   leflunomide 20 MG tablet Commonly known as: ARAVA Take 20 mg by mouth at bedtime.   meloxicam 15 MG tablet Commonly known as: MOBIC Take 15 mg by mouth daily.   methotrexate 2.5 MG tablet Commonly known as: RHEUMATREX TAKE THREE TABLETS BY MOUTH EVERY 7 DAYS RHEUMATOID ARTHRITIS   montelukast 10 MG tablet Commonly known as: SINGULAIR Take 10 mg by  mouth at bedtime.   multivitamin with minerals Tabs tablet Take 1 tablet by mouth daily.   omeprazole 20 MG capsule Commonly known as: PRILOSEC Take 20 mg by mouth daily before breakfast.   PARoxetine 40 MG tablet Commonly known as: PAXIL Take 20 mg by mouth at bedtime.   PRESCRIPTION MEDICATION 2 (two) times a week. Allergy injection   PROBIOTIC DAILY PO Take 1 capsule by mouth daily before breakfast. 100 billion   Spacer/Aero-Holding [employer] 1 Device by Does not apply route as directed. Started by: Alfonse Spruce   Spiriva Respimat 1.25 MCG/ACT Aers Generic drug: Tiotropium Bromide Monohydrate  Inhale 2 puffs into the lungs 2 (two) times daily.   sulfaSALAzine 500 MG tablet Commonly known as: AZULFIDINE Take 500 mg by mouth 2 (two) times daily.   tadalafil 20 MG tablet Commonly known as: CIALIS Take 20 mg by mouth daily as needed for erectile dysfunction.   tamsulosin 0.4 MG Caps capsule Commonly known as: FLOMAX Take 0.4 mg by mouth at bedtime.   traZODone 100 MG tablet Commonly known as: DESYREL Take 100 mg by mouth at bedtime.   TURMERIC CURCUMIN PO Take 900 mg by mouth daily before breakfast.        Birth History: non-contributory  Developmental History: non-contributory  Past Surgical History: Past Surgical History:  Procedure Laterality Date   INGUINAL HERNIA REPAIR Bilateral 07/23/2021   Procedure: LAPAROSCOPIC BILATERAL INGUINAL HERNIA REPAIR WITH MESH;  Surgeon: Kinsinger, De Blanch, MD;  Location: [facility];  Service: General;  Laterality: Bilateral;   knee arthroscopy and cartlidge repair Bilateral    5 on right and 5 on left   NECK SURGERY  2022   A,C,D,F C4/5   SHOULDER ARTHROSCOPY Left 2008   SMALL INTESTINE SURGERY  2017   small intestine resection   TOTAL KNEE ARTHROPLASTY Bilateral 2002   2010     Family History: Family History  Problem Relation Age of Onset   Lung cancer Mother    Heart attack Father 39        "healthier than I am"   Lupus Sister    Multiple myeloma Sister        BMT at [facility]   Stroke Paternal Grandmother    Alpha-1 antitrypsin deficiency Neg Hx      Social History: Yannick lives at home with his wife.  They live in a house that is 31 years old.  There is rugs throughout the home.  They have electric heating and central cooling.  There are no animals inside or outside of the home.  There are dust mite covers on the bed, but not the pillows.  There is no tobacco exposure.  He is currently retired.  There is no fume, chemical, or dust exposure in his job, but he is exposed to dust and his hobbies.  They do have a HEPA filter.  They do not live near an interstate or industrial area.   Review of systems otherwise negative other than that mentioned in the HPI.    Objective:   Blood pressure 126/76, pulse 68, temperature 98 F (36.7 C), temperature source Temporal, resp. rate 16, height 5\' 6"  (1.676 m), weight 182 lb 8 oz (82.8 kg), SpO2 98%. Body mass index is 29.46 kg/m.     Physical Exam Vitals reviewed.  Constitutional:      Appearance: He is well-developed.     Comments: Austin Faulkner.  Very friendly.  HENT:     Head: Normocephalic and atraumatic.     Right Ear: Tympanic membrane, ear canal and external ear normal. No drainage, swelling or tenderness. Tympanic membrane is not injected, scarred, erythematous, retracted or bulging.     Left Ear: Tympanic membrane, ear canal and external ear normal. No drainage, swelling or tenderness. Tympanic membrane is not injected, scarred, erythematous, retracted or bulging.     Nose: No nasal deformity, septal deviation, mucosal edema or rhinorrhea.     Right Turbinates: Enlarged, swollen and pale.     Left Turbinates: Enlarged, swollen and pale.     Right Sinus: No maxillary sinus tenderness or frontal sinus tenderness.  Left Sinus: No maxillary sinus tenderness or frontal sinus tenderness.     Mouth/Throat:     Lips: Pink.      Mouth: Mucous membranes are moist. Mucous membranes are not pale and not dry.     Pharynx: Uvula midline.     Comments: No cobblestoning. Eyes:     General:        Right eye: No discharge.        Left eye: No discharge.     Conjunctiva/sclera: Conjunctivae normal.     Right eye: Right conjunctiva is not injected. No chemosis.    Left eye: Left conjunctiva is not injected. No chemosis.    Pupils: Pupils are equal, round, and reactive to light.  Cardiovascular:     Rate and Rhythm: Normal rate and regular rhythm.     Heart sounds: Normal heart sounds.  Pulmonary:     Effort: Pulmonary effort is normal. No tachypnea, accessory muscle usage or respiratory distress.     Breath sounds: Normal breath sounds. No wheezing, rhonchi or rales.     Comments: Moving air well in all lung fields.  No increased work of breathing. Chest:     Chest wall: No tenderness.  Abdominal:     Tenderness: There is no abdominal tenderness. There is no guarding or rebound.  Lymphadenopathy:     Head:     Right side of head: No submandibular, tonsillar or occipital adenopathy.     Left side of head: No submandibular, tonsillar or occipital adenopathy.     Cervical: No cervical adenopathy.  Skin:    General: Skin is warm.     Capillary Refill: Capillary refill takes less than 2 seconds.     Coloration: Skin is not pale.     Findings: No abrasion, erythema, petechiae or rash. Rash is not papular, urticarial or vesicular.     Comments: No eczematous or urticarial lesions noted.  Neurological:     Mental Status: He is alert.  Psychiatric:        Behavior: Behavior is cooperative.      Diagnostic studies:    Spirometry: results normal (FEV1: 2.29/90%, FVC: 2.73/84%, FEV1/FVC: 84%).    Spirometry consistent with normal pattern.   Allergy Studies: none          Malachi Bonds, MD [facility]

## 2023-01-06 NOTE — Patient Instructions (Addendum)
1. Seasonal and perennial allergic rhinitis - We will start allergy shots and be a bit more aggressive with increasing the dose since you have been on shots for some time now. - But we tend to make them stronger than ENT practices, so I do want to be fairly careful. - Make an appointment to start in 2-3 weeks in [place]. - Continue with your cetirizine and your Flonase daily.  2. Moderate persistent asthma, uncomplicated - Lung testing looks great today. - We are not going to make any medication changes. - Spacer use reviewed. - Daily controller medication(s): Advair 115/92mcg two puffs twice daily with spacer - Prior to physical activity: albuterol 2 puffs 10-15 minutes before physical activity. - Rescue medications: albuterol 4 puffs every 4-6 hours as needed - Asthma control goals:  * Full participation in all desired activities (may need albuterol before activity) * Albuterol use two time or less a week on average (not counting use with activity) * Cough interfering with sleep two time or less a month * Oral steroids no more than once a year * No hospitalizations  3. Return in about 6 months (around 07/09/2023). You can have the follow up appointment with Dr. Dellis Anes or a Nurse Practicioner (our Nurse Practitioners are excellent and always have Physician oversight!).    Please inform us of any Emergency Department visits, hospitalizations, or changes in symptoms. Call us before going to the ED for breathing or allergy symptoms since we might be able to fit you in for a sick visit. Feel free to contact us anytime with any questions, problems, or concerns.  It was a pleasure to meet you today! You are a joy!   Websites that have reliable patient information: 1. American Academy of Asthma, Allergy, and Immunology: www.aaaai.org 2. Food Allergy Research and Education (FARE): foodallergy.org 3. Mothers of Asthmatics: http://www.asthmacommunitynetwork.org 4. American College of  Allergy, Asthma, and Immunology: www.acaai.org   COVID-19 Vaccine Information can be found at: PodExchange.[email or web address] For questions related to vaccine distribution or appointments, please email [email or web address] or call [phone].   We realize that you might be concerned about having an allergic reaction to the COVID19 vaccines. To help with that concern, WE ARE OFFERING THE COVID19 VACCINES IN OUR OFFICE! Ask the front desk for dates!     "Like" [place] on Facebook and Instagram for our latest updates!      A healthy democracy works best when [employer] participate! Make sure you are registered to vote! If you have moved or changed any of your contact information, you will need to get this updated before voting! Scan the QR codes below to learn more!

## 2023-01-07 ENCOUNTER — Encounter: Payer: Self-pay | Admitting: Allergy & Immunology

## 2023-01-07 NOTE — Addendum Note (Signed)
Addended by: Kellie Simmering, Maxx Calaway on: 01/07/2023 03:33 PM   Modules accepted: Orders

## 2023-01-10 DIAGNOSIS — J3081 Allergic rhinitis due to animal (cat) (dog) hair and dander: Secondary | ICD-10-CM

## 2023-01-10 NOTE — Progress Notes (Signed)
Aeroallergen Immunotherapy   Ordering Provider: Dr. Malachi Bonds   Patient Details  Name: AMMIEL TALFORD  MRN: [id number]  Date of Birth: Oct 06, 1955   Order 2 of 2   Vial Label: Molds/DM/RW/CR   0.6 ml (Volume)  1:20 Concentration -- Ragweed Mix  0.2 ml (Volume)  1:20 Concentration -- Alternaria alternata  0.2 ml (Volume)  1:20 Concentration -- Cladosporium herbarum  0.2 ml (Volume)  1:10 Concentration -- Aspergillus mix  0.2 ml (Volume)  1:10 Concentration -- Penicillium mix  0.3 ml (Volume)  1:20 Concentration -- Cockroach, German  0.8 ml (Volume)   AU Concentration -- Mite Mix (DF 5,000 & DP 5,000)    2.5  ml Extract Subtotal  2.5  ml Diluent  5.0  ml Maintenance Total   Schedule:  C  Silver Vial (1:1,000,000): Schedule C (5 doses)  Blue Vial (1:100,000): Schedule C (5 doses)  Yellow Vial (1:10,000): Schedule C (5 doses)  Green Vial (1:1,000): Schedule C (5 doses)  Red Vial (1:100): Schedule C (5 doses)   Special Instructions: After completion of the first Red Vial, please space to every two weeks. After completion of the second Red Vial, please space to every 4 weeks. Ok to up dose new vials at 0.28mL --> 0.3 mL --> 0.5 mL. Ok to come twice weekly, if desired, as long as there is 48 hours between injections.  Insert SmartText   125%

## 2023-01-10 NOTE — Progress Notes (Signed)
VIALS EXP 01-10-24

## 2023-01-10 NOTE — Progress Notes (Signed)
Aeroallergen Immunotherapy  Ordering Provider: Dr. Malachi Bonds  Patient Details Name: Austin Faulkner MRN: [id number] Date of Birth: 03/10/1956  Order 1 of 2  Vial Label: G/W/T/C/D  0.3 ml (Volume)  BAU Concentration -- 7 Grass Mix* 100,000 ([address] [place], [place], [place], [place], [place], Sweet Vernal, [place]) 0.3 ml (Volume)  BAU Concentration -- [place] 10,000 0.2 ml (Volume)  1:20 Concentration -- Johnson 0.2 ml (Volume)  1:10 Concentration -- Sheep Sorrell* 0.5 ml (Volume)  1:20 Concentration -- Weed Mix* 0.5 ml (Volume)  1:20 Concentration -- Eastern 10 Tree Mix (also Sweet Gum) 0.1 ml (Volume)  1:10 Concentration -- Birch mix* 0.2 ml (Volume)  1:10 Concentration -- Cedar, red 0.1 ml (Volume)  1:20 Concentration -- Cottonwood, Eastern* 0.1 ml (Volume)  1:10 Concentration -- Hickory* 0.1 ml (Volume)  1:20 Concentration -- Maple Mix* 0.1 ml (Volume)  1:10 Concentration -- Oak, [place] mix* 0.2 ml (Volume)  1:20 Concentration -- Red Mulberry 0.5 ml (Volume)  1:10 Concentration -- Cat Hair 0.5 ml (Volume)  1:10 Concentration -- Dog Epithelia   3.9  ml Extract Subtotal 1.1  ml Diluent 5.0  ml Maintenance Total  Schedule:  C Silver Vial (1:1,000,000): Schedule C (5 doses) Blue Vial (1:100,000): Schedule C (5 doses) Yellow Vial (1:10,000): Schedule C (5 doses) Green Vial (1:1,000): Schedule C (5 doses) Red Vial (1:100): Schedule C (5 doses)  Special Instructions: After completion of the first Red Vial, please space to every two weeks. After completion of the second Red Vial, please space to every 4 weeks. Ok to up dose new vials at 0.44mL --> 0.3 mL --> 0.5 mL. Ok to come twice weekly, if desired, as long as there is 48 hours between injections.

## 2023-01-11 DIAGNOSIS — J3089 Other allergic rhinitis: Secondary | ICD-10-CM | POA: Diagnosis not present

## 2023-01-25 ENCOUNTER — Ambulatory Visit (INDEPENDENT_AMBULATORY_CARE_PROVIDER_SITE_OTHER): Payer: No Typology Code available for payment source

## 2023-01-25 DIAGNOSIS — J309 Allergic rhinitis, unspecified: Secondary | ICD-10-CM

## 2023-01-25 NOTE — Progress Notes (Signed)
Immunotherapy   Patient Details  Name: Austin Faulkner MRN: [id number] Date of Birth: [birth year]  01/25/2023  [name] started injections for Silver 1:1 million (G-W-T-C-D and M-DM-RW-CR) Following schedule: C  Frequency:1 time per week  Epi-Pen:Epi-Pen Available  Consent signed and patient instructions given.   [name] J [name] 01/25/2023, 8:44 AM

## 2023-02-03 ENCOUNTER — Ambulatory Visit (INDEPENDENT_AMBULATORY_CARE_PROVIDER_SITE_OTHER): Payer: No Typology Code available for payment source

## 2023-02-03 DIAGNOSIS — J309 Allergic rhinitis, unspecified: Secondary | ICD-10-CM

## 2023-02-09 ENCOUNTER — Ambulatory Visit (INDEPENDENT_AMBULATORY_CARE_PROVIDER_SITE_OTHER): Payer: No Typology Code available for payment source

## 2023-02-09 DIAGNOSIS — J309 Allergic rhinitis, unspecified: Secondary | ICD-10-CM | POA: Diagnosis not present

## 2023-02-14 ENCOUNTER — Ambulatory Visit (INDEPENDENT_AMBULATORY_CARE_PROVIDER_SITE_OTHER): Payer: No Typology Code available for payment source

## 2023-02-14 DIAGNOSIS — J309 Allergic rhinitis, unspecified: Secondary | ICD-10-CM

## 2023-02-28 ENCOUNTER — Ambulatory Visit (INDEPENDENT_AMBULATORY_CARE_PROVIDER_SITE_OTHER): Payer: No Typology Code available for payment source

## 2023-02-28 DIAGNOSIS — J309 Allergic rhinitis, unspecified: Secondary | ICD-10-CM

## 2023-03-09 ENCOUNTER — Ambulatory Visit (INDEPENDENT_AMBULATORY_CARE_PROVIDER_SITE_OTHER): Payer: No Typology Code available for payment source

## 2023-03-09 DIAGNOSIS — J309 Allergic rhinitis, unspecified: Secondary | ICD-10-CM | POA: Diagnosis not present

## 2023-03-22 ENCOUNTER — Ambulatory Visit (INDEPENDENT_AMBULATORY_CARE_PROVIDER_SITE_OTHER): Payer: No Typology Code available for payment source | Admitting: *Deleted

## 2023-03-22 DIAGNOSIS — J309 Allergic rhinitis, unspecified: Secondary | ICD-10-CM

## 2023-03-29 ENCOUNTER — Ambulatory Visit (INDEPENDENT_AMBULATORY_CARE_PROVIDER_SITE_OTHER): Payer: No Typology Code available for payment source

## 2023-03-29 DIAGNOSIS — J309 Allergic rhinitis, unspecified: Secondary | ICD-10-CM

## 2023-04-06 ENCOUNTER — Ambulatory Visit (INDEPENDENT_AMBULATORY_CARE_PROVIDER_SITE_OTHER): Payer: No Typology Code available for payment source

## 2023-04-06 DIAGNOSIS — J309 Allergic rhinitis, unspecified: Secondary | ICD-10-CM

## 2023-04-14 ENCOUNTER — Ambulatory Visit (INDEPENDENT_AMBULATORY_CARE_PROVIDER_SITE_OTHER): Payer: No Typology Code available for payment source

## 2023-04-14 DIAGNOSIS — J309 Allergic rhinitis, unspecified: Secondary | ICD-10-CM | POA: Diagnosis not present

## 2023-04-19 ENCOUNTER — Ambulatory Visit (INDEPENDENT_AMBULATORY_CARE_PROVIDER_SITE_OTHER): Payer: No Typology Code available for payment source

## 2023-04-19 DIAGNOSIS — J309 Allergic rhinitis, unspecified: Secondary | ICD-10-CM | POA: Diagnosis not present

## 2023-04-26 ENCOUNTER — Ambulatory Visit (INDEPENDENT_AMBULATORY_CARE_PROVIDER_SITE_OTHER): Payer: No Typology Code available for payment source

## 2023-04-26 DIAGNOSIS — J309 Allergic rhinitis, unspecified: Secondary | ICD-10-CM | POA: Diagnosis not present

## 2023-05-03 ENCOUNTER — Ambulatory Visit (INDEPENDENT_AMBULATORY_CARE_PROVIDER_SITE_OTHER): Payer: No Typology Code available for payment source

## 2023-05-03 DIAGNOSIS — J309 Allergic rhinitis, unspecified: Secondary | ICD-10-CM

## 2023-05-13 ENCOUNTER — Ambulatory Visit (INDEPENDENT_AMBULATORY_CARE_PROVIDER_SITE_OTHER): Payer: No Typology Code available for payment source

## 2023-05-13 DIAGNOSIS — J309 Allergic rhinitis, unspecified: Secondary | ICD-10-CM

## 2023-05-16 ENCOUNTER — Encounter: Payer: Self-pay | Admitting: Family

## 2023-05-16 ENCOUNTER — Ambulatory Visit: Payer: Medicare Other | Admitting: Family

## 2023-05-16 VITALS — BP 129/73 | HR 77 | Temp 98.7°F | Resp 16 | Ht 69.0 in | Wt 186.0 lb

## 2023-05-16 DIAGNOSIS — Z1159 Encounter for screening for other viral diseases: Secondary | ICD-10-CM

## 2023-05-16 DIAGNOSIS — M0609 Rheumatoid arthritis without rheumatoid factor, multiple sites: Secondary | ICD-10-CM

## 2023-05-16 DIAGNOSIS — K219 Gastro-esophageal reflux disease without esophagitis: Secondary | ICD-10-CM | POA: Diagnosis not present

## 2023-05-16 DIAGNOSIS — I10 Essential (primary) hypertension: Secondary | ICD-10-CM | POA: Diagnosis not present

## 2023-05-16 DIAGNOSIS — Z114 Encounter for screening for human immunodeficiency virus [HIV]: Secondary | ICD-10-CM

## 2023-05-16 DIAGNOSIS — Z125 Encounter for screening for malignant neoplasm of prostate: Secondary | ICD-10-CM

## 2023-05-16 DIAGNOSIS — Z23 Encounter for immunization: Secondary | ICD-10-CM

## 2023-05-16 DIAGNOSIS — F32A Depression, unspecified: Secondary | ICD-10-CM

## 2023-05-16 DIAGNOSIS — D649 Anemia, unspecified: Secondary | ICD-10-CM

## 2023-05-16 DIAGNOSIS — J4540 Moderate persistent asthma, uncomplicated: Secondary | ICD-10-CM | POA: Diagnosis not present

## 2023-05-16 DIAGNOSIS — Z0000 Encounter for general adult medical examination without abnormal findings: Secondary | ICD-10-CM

## 2023-05-16 LAB — CBC WITH DIFFERENTIAL/PLATELET
Basophils Absolute: 0 10*3/uL (ref 0.0–0.1)
Basophils Relative: 1 % (ref 0.0–3.0)
Eosinophils Absolute: 0 10*3/uL (ref 0.0–0.7)
Eosinophils Relative: 0.7 % (ref 0.0–5.0)
HCT: 41.3 % (ref 39.0–52.0)
Hemoglobin: 13.7 g/dL (ref 13.0–17.0)
Lymphocytes Relative: 35.6 % (ref 12.0–46.0)
Lymphs Abs: 1.7 10*3/uL (ref 0.7–4.0)
MCHC: 33.2 g/dL (ref 30.0–36.0)
MCV: 100.7 fL — ABNORMAL HIGH (ref 78.0–100.0)
Monocytes Absolute: 0.5 10*3/uL (ref 0.1–1.0)
Monocytes Relative: 11.3 % (ref 3.0–12.0)
Neutro Abs: 2.5 10*3/uL (ref 1.4–7.7)
Neutrophils Relative %: 51.4 % (ref 43.0–77.0)
Platelets: 319 10*3/uL (ref 150.0–400.0)
RBC: 4.1 Mil/uL — ABNORMAL LOW (ref 4.22–5.81)
RDW: 13.3 % (ref 11.5–15.5)
WBC: 4.8 10*3/uL (ref 4.0–10.5)

## 2023-05-16 LAB — COMPREHENSIVE METABOLIC PANEL
ALT: 18 U/L (ref 0–53)
AST: 21 U/L (ref 0–37)
Albumin: 4.3 g/dL (ref 3.5–5.2)
Alkaline Phosphatase: 44 U/L (ref 39–117)
BUN: 13 mg/dL (ref 6–23)
CO2: 31 meq/L (ref 19–32)
Calcium: 9.2 mg/dL (ref 8.4–10.5)
Chloride: 101 meq/L (ref 96–112)
Creatinine, Ser: 1.02 mg/dL (ref 0.40–1.50)
GFR: 77.33 mL/min (ref >60.00)
Glucose, Bld: 79 mg/dL (ref 70–99)
Potassium: 4.1 meq/L (ref 3.5–5.1)
Sodium: 140 meq/L (ref 135–145)
Total Bilirubin: 0.8 mg/dL (ref 0.2–1.2)
Total Protein: 6.4 g/dL (ref 6.0–8.3)

## 2023-05-16 LAB — IBC + FERRITIN
Ferritin: 80.6 ng/mL (ref 22.0–322.0)
Iron: 99 ug/dL (ref 42–165)
Saturation Ratios: 30.3 % (ref 20.0–50.0)
TIBC: 326.2 ug/dL (ref 250.0–450.0)
Transferrin: 233 mg/dL (ref 212.0–360.0)

## 2023-05-16 LAB — PSA, MEDICARE: PSA: 3.1 ng/mL (ref 0.10–4.00)

## 2023-05-16 NOTE — Assessment & Plan Note (Signed)
Stable on omeprazole. 

## 2023-05-16 NOTE — Assessment & Plan Note (Addendum)
Clinically stable on methotrexate/plquenil. Followed by Rheumatology at the [facility].

## 2023-05-16 NOTE — Progress Notes (Signed)
Subjective:   Austin Faulkner is a 67 y.o. male who presents for Medicare Annual/Subsequent preventive examination.  Visit Complete: In person  Patient Medicare AWV questionnaire was completed by the patient on 05/16/23; I have confirmed that all information answered by patient is correct and no changes since this date.   The patient, a veteran with a history of rheumatoid arthritis, asthma, and reflux, presents with recent shingles infection that affected his eye. He reports that his rheumatoid arthritis is well-managed with medication, but he has experienced decreased appetite and changes in taste. He also reports snoring and has recently had a sleep apnea test, but has not received the results. He has been experiencing increased anxiety and PTSD symptoms due to multiple family stressors, including caring for his parents and his wife's mother, and his brother-in-law being on life support. Despite these stressors, he remains proactive about his health and has regular appointments with various specialists. He is due for a flu shot and needs to get the shingles vaccine and COVID booster at the [place].     Objective:    Today's Vitals   05/16/23 0818  BP: 129/73  Pulse: 77  Resp: 16  Temp: 98.7 F (37.1 C)  TempSrc: Oral  SpO2: 100%  Weight: 186 lb (84.4 kg)  Height: 5\' 9"  (1.753 m)   Body mass index is 27.47 kg/m.     09/14/2021   10:07 AM 07/13/2021   10:06 AM 06/28/2018   12:14 AM 07/28/2017    9:50 AM 03/05/2017   12:29 PM 06/23/2016    6:46 AM 06/22/2016    8:14 AM  Advanced Directives  Does Patient Have a Medical Advance Directive? Yes Yes No No No Yes Yes  Type of Estate agent of [place];Living will Healthcare Power of [place];Living will    Living will;Healthcare Power of [employer] Power of [place];Living will  Does patient want to make changes to medical advance directive?      No - Patient declined   Copy of Healthcare Power of Attorney in  Chart? No - copy requested     No - copy requested No - copy requested  Would patient like information on creating a medical advance directive?    No - Patient declined No - Patient declined      Current Medications (verified) Outpatient Encounter Medications as of 05/16/2023  Medication Sig   albuterol (VENTOLIN HFA) 108 (90 Base) MCG/ACT inhaler Inhale 2 puffs into the lungs every 4 (four) hours as needed for wheezing or shortness of breath.   cetirizine (ZYRTEC) 10 MG tablet Take 1 tablet (10 mg total) by mouth daily as needed for allergies (Can take an extra dose during flare ups.).   EPINEPHrine 0.3 mg/0.3 mL IJ SOAJ injection Inject 0.3 mg into the muscle as needed for anaphylaxis.   ferrous gluconate (FERGON) 324 MG tablet Take 324 mg by mouth at bedtime.   fluocinonide cream (LIDEX) 0.05 % Apply 1 Application topically as needed.   fluticasone-salmeterol (ADVAIR HFA) 115-21 MCG/ACT inhaler Inhale 2 puffs into the lungs 2 (two) times daily.   folic acid (FOLVITE) 1 MG tablet Take 1 tablet by mouth daily.   hydrochlorothiazide (HYDRODIURIL) 25 MG tablet Take 25 mg by mouth daily.   hydroxychloroquine (PLAQUENIL) 200 MG tablet Take 200 mg by mouth 2 (two) times daily.   ipratropium-albuterol (DUONEB) 0.5-2.5 (3) MG/3ML SOLN Take 3 mLs by nebulization every 6 (six) hours as needed.   ketotifen (ZADITOR) 0.035 % ophthalmic  solution Place 1 drop into both eyes 2 (two) times daily.   leflunomide (ARAVA) 20 MG tablet Take 20 mg by mouth at bedtime.   meloxicam (MOBIC) 15 MG tablet Take 15 mg by mouth daily.   methotrexate (RHEUMATREX) 2.5 MG tablet TAKE THREE TABLETS BY MOUTH EVERY 7 DAYS RHEUMATOID ARTHRITIS   montelukast (SINGULAIR) 10 MG tablet Take 10 mg by mouth at bedtime.   Multiple Vitamin (MULTIVITAMIN WITH MINERALS) TABS tablet Take 1 tablet by mouth daily.   omeprazole (PRILOSEC) 20 MG capsule Take 20 mg by mouth daily before breakfast.   PRESCRIPTION MEDICATION 2 (two) times a  week. Allergy injection   Probiotic Product (PROBIOTIC DAILY PO) Take 1 capsule by mouth daily before breakfast. 100 billion   Spacer/Aero-Holding Chambers DEVI 1 Device by Does not apply route as directed.   sulfaSALAzine (AZULFIDINE) 500 MG tablet Take 500 mg by mouth 2 (two) times daily.   tadalafil (CIALIS) 20 MG tablet Take 20 mg by mouth daily as needed for erectile dysfunction.   tamsulosin (FLOMAX) 0.4 MG CAPS capsule Take 0.4 mg by mouth at bedtime.   Tiotropium Bromide Monohydrate (SPIRIVA RESPIMAT) 1.25 MCG/ACT AERS Inhale 2 puffs into the lungs 2 (two) times daily.   traZODone (DESYREL) 100 MG tablet Take 100 mg by mouth at bedtime.   TURMERIC CURCUMIN PO Take 900 mg by mouth daily before breakfast.   [DISCONTINUED] fluticasone (FLONASE) 50 MCG/ACT nasal spray Place 2 sprays into both nostrils daily as needed for allergies or rhinitis.   [DISCONTINUED] PARoxetine (PAXIL) 40 MG tablet Take 20 mg by mouth at bedtime.   No facility-administered encounter medications on file as of 05/16/2023.    Allergies (verified) Azithromycin, Sildenafil, and Tape   History: Past Medical History:  Diagnosis Date   Asthma    COPD (chronic obstructive pulmonary disease) (HCC)    Depression    PTSD   Diverticulitis    Diverticulitis of colon with perforation 03/04/2016   Dyspnea    secondary to seasonal allergies; or illness   GERD (gastroesophageal reflux disease)    Heart murmur    slight since birth   Hypertension    Pneumonia yrs ago   Rheumatoid arthritis (HCC)    Seasonal allergies    Shoulder dislocation    Past Surgical History:  Procedure Laterality Date   INGUINAL HERNIA REPAIR Bilateral 07/23/2021   Procedure: LAPAROSCOPIC BILATERAL INGUINAL HERNIA REPAIR WITH MESH;  Surgeon: Kinsinger, De Blanch, MD;  Location: [facility];  Service: General;  Laterality: Bilateral;   knee arthroscopy and cartlidge repair Bilateral    5 on right and 5 on left   NECK SURGERY  2022   A,C,D,F  C4/5   SHOULDER ARTHROSCOPY Left 2008   SMALL INTESTINE SURGERY  2017   small intestine resection   TOTAL KNEE ARTHROPLASTY Bilateral 2002   2010   Family History  Problem Relation Age of Onset   Lung cancer Mother    Heart attack Father 46       "healthier than I am"   Lupus Sister    Multiple myeloma Sister        BMT at [facility]   Stroke Paternal Grandmother    Alpha-1 antitrypsin deficiency Neg Hx    Social History   Socioeconomic History   Marital status: Married    Spouse name: Austin Faulkner   Number of children: 3   Years of education: Not on file   Highest education level: Not on file  Occupational History  Occupation: retired  Tobacco Use   Smoking status: Former    Types: Cigarettes, Cigars    Quit date: 2019    Years since quitting: 5.9    Passive exposure: Yes   Smokeless tobacco: Never  Vaping Use   Vaping status: Never Used  Substance and Sexual Activity   Alcohol use: No   Drug use: No   Sexual activity: Yes    Partners: Female  Other Topics Concern   Not on file  Social History Narrative   Not on file   Social Drivers of Health   Financial Resource Strain: Low Risk  (09/14/2021)   Overall Financial Resource Strain (CARDIA)    Difficulty of Paying Living Expenses: Not hard at all  Food Insecurity: No Food Insecurity (06/08/2022)   Hunger Vital Sign    Worried About Running Out of Food in the Last Year: Never true    Ran Out of Food in the Last Year: Never true  Transportation Needs: No Transportation Needs (06/08/2022)   PRAPARE - Administrator, Civil Service (Medical): No    Lack of Transportation (Non-Medical): No  Physical Activity: Inactive (09/14/2021)   Exercise Vital Sign    Days of Exercise per Week: 0 days    Minutes of Exercise per Session: 0 min  Stress: Stress Concern Present (09/14/2021)   [employer] of Occupational Health - Occupational Stress Questionnaire    Feeling of Stress : To some extent  Social Connections:  Socially Integrated (09/14/2021)   Social Connection and Isolation Panel [NHANES]    Frequency of Communication with Friends and Family: More than three times a week    Frequency of Social Gatherings with Friends and Family: More than three times a week    Attends Religious Services: More than 4 times per year    Active Member of [employer] or Organizations: Yes    Attends Engineer, structural: More than 4 times per year    Marital Status: Married    Tobacco Counseling Counseling given: Not Answered   Activities of Daily Living     No data to display          Patient Care Team: Sandford Craze, NP as PCP - General (Internal Medicine)  Indicate any recent Medical Services you may have received from other than [facility] providers in the past year (date may be approximate).     Assessment:   This is a routine wellness examination for Austin Faulkner.  Hearing/Vision screen No results found.   Goals Addressed   None    Depression Screen    09/14/2021   10:11 AM 04/21/2021   10:29 AM  PHQ 2/9 Scores  PHQ - 2 Score 1 2  PHQ- 9 Score  8    Fall Risk    09/14/2021   10:09 AM  Fall Risk   Falls in the past year? 0  Number falls in past yr: 0  Injury with Fall? 0  Follow up Falls prevention discussed    MEDICARE RISK AT HOME:    TIMED UP AND GO:  Was the test performed?  No    Cognitive Function:  A and O x 3      Immunizations Immunization History  Administered Date(s) Administered   Influenza Split 02/14/2012   Influenza Whole 04/07/2007   Influenza, Seasonal, Injecte, Preservative Fre 04/06/2010, 03/30/2011   Influenza,inj,Quad PF,6+ Mos 03/09/2018, 02/23/2019, 04/21/2021   Influenza-Unspecified 03/14/2002, 06/17/2003, 03/16/2004, 04/07/2007, 01/23/2012, 05/29/2014, 03/31/2015, 04/21/2021   Moderna Covid-19 Vaccine  Bivalent Booster 63yrs & up 03/31/2021   Moderna Sars-Covid-2 Vaccination 08/06/2019, 09/03/2019, 09/19/2020   PFIZER(Purple Top)SARS-COV-2  Vaccination 08/06/2019, 09/03/2019   Pneumococcal Polysaccharide-23 04/07/2007, 01/09/2020   Td 05/24/1996   Tdap 01/22/2010, 03/30/2011, 11/02/2021    TDAP status: Up to date  Flu Vaccine status: Due, Education has been provided regarding the importance of this vaccine. Advised may receive this vaccine at local pharmacy or Health Dept. Aware to provide a copy of the vaccination record if obtained from local pharmacy or Health Dept. Verbalized acceptance and understanding.  Pneumococcal vaccine status: Up to date  Covid-19 vaccine status: Information provided on how to obtain vaccines.   Qualifies for Shingles Vaccine? Yes   Zostavax completed No   Shingrix Completed?: No.    Education has been provided regarding the importance of this vaccine. Patient has been advised to call insurance company to determine out of pocket expense if they have not yet received this vaccine. Advised may also receive vaccine at local pharmacy or Health Dept. Verbalized acceptance and understanding.  Screening Tests Health Maintenance  Topic Date Due   HIV Screening  Never done   Hepatitis C Screening  Never done   Zoster Vaccines- Shingrix (1 of 2) Never done   Pneumonia Vaccine 79+ Years old (2 of 2 - PCV) 01/08/2021   Medicare Annual Wellness (AWV)  09/15/2022   COVID-19 Vaccine (8 - 2024-25 season) 01/23/2023   INFLUENZA VACCINE  08/22/2023 (Originally 12/23/2022)   Colonoscopy  01/29/2026   DTaP/Tdap/Td (5 - Td or Tdap) 11/03/2031   HPV VACCINES  Aged Out    Health Maintenance  Health Maintenance Due  Topic Date Due   HIV Screening  Never done   Hepatitis C Screening  Never done   Zoster Vaccines- Shingrix (1 of 2) Never done   Pneumonia Vaccine 57+ Years old (2 of 2 - PCV) 01/08/2021   Medicare Annual Wellness (AWV)  09/15/2022   COVID-19 Vaccine (8 - 2024-25 season) 01/23/2023    Colorectal cancer screening: Type of screening: Colonoscopy. Completed 2022. Repeat every 5 years     Additional Screening:  Hepatitis C Screening: does qualify; Completed today  Vision Screening: Recommended annual ophthalmology exams for early detection of glaucoma and other disorders of the eye. Is the patient up to date with their annual eye exam?  Yes  Who is the provider or what is the name of the office in which the patient attends annual [facility] exams[facility] Read[facility] from Last [facility] Encounters:  05/16/23 186 lb (84.4 kg)  01/06/23 182 lb 8 oz (82.8 kg)  11/10/21 186 lb 9.6 oz (84.6 kg)    If pt is not established with a provider, would they like to be referred to a provider to establish care? No .   Dental Screening: Recommended annual dental exams for proper oral hygiene  Community Resource Referral / Chronic Care Management: CRR required this visit?  No   CCM required this visit?  No     Gen: Awake, alert, no acute distress Resp: Breathing is even and non-labored, CTA bilaterally CV: S1/S2, RRR Psych: calm/pleasant demeanor Neuro: Alert and Oriented x 3, + facial symmetry, speech is clear.  Plan:     I have personally reviewed and noted the following in the patient's chart:   Medical and social history Use of alcohol, tobacco or illicit drugs  Current medications and supplements including opioid prescriptions. Patient is not currently taking opioid prescriptions. Functional ability and status Nutritional status Physical activity Advanced  directives List of other physicians Hospitalizations, surgeries, and ER visits in previous 12 months Vitals Screenings to include cognitive, depression, and falls Referrals and appointments  In addition, I have reviewed and discussed with patient certain preventive protocols, quality metrics, and best practice recommendations. A written personalized care plan for preventive services as well as general preventive health recommendations were provided to patient.     Lemont Fillers, NP   05/16/2023   After Visit Summary:  see my  chart.

## 2023-05-16 NOTE — Assessment & Plan Note (Signed)
 Stable on hydrochlorothiazide

## 2023-05-16 NOTE — Assessment & Plan Note (Signed)
Lab Results  Component Value Date   WBC 6.5 08/03/2021   HGB 12.9 (L) 08/03/2021   HCT 39.0 08/03/2021   MCV 94.3 08/03/2021   PLT 355.0 08/03/2021   Mild anemia.  Continues iron supplement.

## 2023-05-16 NOTE — Patient Instructions (Signed)
VISIT SUMMARY:  During today's visit, we discussed your recent shingles infection, ongoing management of your chronic conditions, and addressed your increased anxiety and PTSD symptoms. We also reviewed your general health maintenance and vaccination schedule.  YOUR PLAN:  -HERPES ZOSTER OPHTHALMICUS: Herpes Zoster Ophthalmicus is a shingles infection that affects the eye. Your recent episode was managed with antibiotics and eye drops, and you currently have no symptoms. You will receive the Shingles vaccine at the [place] to help prevent future outbreaks.  -ANXIETY/PTSD: Anxiety and PTSD are mental health conditions that can be exacerbated by stress. Given your current family stressors, it's important to continue your regular psychiatric follow-ups at the [place] to manage these symptoms.  -HEARING LOSS/TINNITUS: Hearing loss and tinnitus refer to reduced hearing ability and ringing in the ears, respectively. You have been diagnosed with hearing loss in your right ear and tinnitus, and you are scheduled for a hearing aid fitting in March. Continue with your current management at the [place].  -ASTHMA: Asthma is a condition that affects your airways and can cause difficulty breathing. Your asthma is well controlled with your current medications (Advair, Spiriva, and Singulair), and you have emergency Prednisone for any flares. Continue with your current asthma management.  -GASTROESOPHAGEAL REFLUX DISEASE (GERD): GERD is a condition where stomach acid frequently flows back into the tube connecting your mouth and stomach. Your GERD is well controlled with Omeprazole 20mg  daily. Continue taking Omeprazole 20mg  daily.  -HYPERTENSION: Hypertension is high blood pressure. Your hypertension is well controlled with Hydrochlorothiazide. Continue taking Hydrochlorothiazide as prescribed.  -GENERAL HEALTH MAINTENANCE: For your general health, you will receive the influenza vaccine today and plan to get the COVID booster at  the [place]. We will also check your HIV and Hepatitis C status, kidney function, blood count, and iron levels today. Follow up in 6 months unless any issues arise.  INSTRUCTIONS:  Please follow up in 6 months unless any issues arise. Make sure to get your Shingles vaccine and COVID booster at the [place]. Continue with your regular psychiatric follow-ups and current management for all your conditions. We will check your HIV and Hepatitis C status, kidney function, blood count, and iron levels today.

## 2023-05-16 NOTE — Addendum Note (Signed)
Addended by: Wilford Corner on: 05/16/2023 09:38 AM   Modules accepted: Orders

## 2023-05-16 NOTE — Assessment & Plan Note (Signed)
Stable on advair, singulair, albuterol prn, spiriva.

## 2023-05-16 NOTE — Assessment & Plan Note (Signed)
He is working closely with psychiatry/psychology and is maintained on paxil.  Finding his treatment stable.

## 2023-05-17 LAB — HEPATITIS C ANTIBODY: Hepatitis C Ab: NONREACTIVE

## 2023-05-17 LAB — HIV ANTIBODY (ROUTINE TESTING W REFLEX): HIV 1&2 Ab, 4th Generation: NONREACTIVE

## 2023-05-23 ENCOUNTER — Ambulatory Visit (INDEPENDENT_AMBULATORY_CARE_PROVIDER_SITE_OTHER): Payer: No Typology Code available for payment source

## 2023-05-23 DIAGNOSIS — J309 Allergic rhinitis, unspecified: Secondary | ICD-10-CM | POA: Diagnosis not present

## 2023-05-30 ENCOUNTER — Ambulatory Visit (INDEPENDENT_AMBULATORY_CARE_PROVIDER_SITE_OTHER): Payer: No Typology Code available for payment source | Admitting: *Deleted

## 2023-05-30 DIAGNOSIS — J309 Allergic rhinitis, unspecified: Secondary | ICD-10-CM

## 2023-06-08 ENCOUNTER — Ambulatory Visit (INDEPENDENT_AMBULATORY_CARE_PROVIDER_SITE_OTHER): Payer: No Typology Code available for payment source

## 2023-06-08 DIAGNOSIS — J309 Allergic rhinitis, unspecified: Secondary | ICD-10-CM | POA: Diagnosis not present

## 2023-06-15 ENCOUNTER — Ambulatory Visit (INDEPENDENT_AMBULATORY_CARE_PROVIDER_SITE_OTHER): Payer: No Typology Code available for payment source

## 2023-06-15 DIAGNOSIS — J309 Allergic rhinitis, unspecified: Secondary | ICD-10-CM | POA: Diagnosis not present

## 2023-06-20 ENCOUNTER — Ambulatory Visit (INDEPENDENT_AMBULATORY_CARE_PROVIDER_SITE_OTHER): Payer: No Typology Code available for payment source

## 2023-06-20 DIAGNOSIS — J309 Allergic rhinitis, unspecified: Secondary | ICD-10-CM

## 2023-06-29 ENCOUNTER — Ambulatory Visit (INDEPENDENT_AMBULATORY_CARE_PROVIDER_SITE_OTHER): Payer: No Typology Code available for payment source | Admitting: Family

## 2023-06-29 VITALS — BP 138/77 | HR 101 | Temp 98.6°F | Resp 16 | Ht 69.0 in | Wt 181.0 lb

## 2023-06-29 DIAGNOSIS — F4310 Post-traumatic stress disorder, unspecified: Secondary | ICD-10-CM | POA: Diagnosis not present

## 2023-06-29 DIAGNOSIS — M0609 Rheumatoid arthritis without rheumatoid factor, multiple sites: Secondary | ICD-10-CM

## 2023-06-29 DIAGNOSIS — R011 Cardiac murmur, unspecified: Secondary | ICD-10-CM

## 2023-06-29 DIAGNOSIS — H9190 Unspecified hearing loss, unspecified ear: Secondary | ICD-10-CM | POA: Insufficient documentation

## 2023-06-29 DIAGNOSIS — G4733 Obstructive sleep apnea (adult) (pediatric): Secondary | ICD-10-CM | POA: Diagnosis not present

## 2023-06-29 NOTE — Assessment & Plan Note (Signed)
  Scheduled to be fitted for a hearing aid in the right ear next week. -Continue with planned hearing aid fitting.

## 2023-06-29 NOTE — Assessment & Plan Note (Signed)
  Reports high levels of stress and anxiety. Current psychologist and psychiatrist are leaving, causing additional stress. -Referral to in-house psychologist for ongoing management and to provide a nexus letter for the patient's board review.

## 2023-06-29 NOTE — Assessment & Plan Note (Signed)
I don't see a recent echo. Will obtain echo for further evaluation.

## 2023-06-29 NOTE — Assessment & Plan Note (Signed)
 Stable, management per [facility] rheumatology.

## 2023-06-29 NOTE — Assessment & Plan Note (Signed)
 New diagnosis. Just started CPAP, seems to be tolerating well.

## 2023-06-29 NOTE — Patient Instructions (Addendum)
 Please call to schedule an appointment with Jenkins Nicolas pschologist at the [phone].  VISIT SUMMARY:  Today, we discussed several of your health concerns, including your PTSD, sleep apnea, hearing loss, and heart murmur. We also talked about your recent COVID-19 booster and plans for additional vaccinations. We have made a plan to address each of these issues to help you manage your health effectively.  YOUR PLAN:  -SLEEP APNEA: Sleep apnea is a condition where your breathing stops and starts repeatedly during sleep. You have started using a CPAP machine and are taking Trazodone  to help with sleep. Please continue using the CPAP machine and taking Trazodone  as prescribed.  -PTSD: Post-Traumatic Stress Disorder (PTSD) is a mental health condition triggered by experiencing or witnessing a traumatic event. You are experiencing increased anxiety and panic attacks due to the upcoming departure of your current mental health providers. We will refer you to an in-house psychologist for ongoing management and to provide the necessary nexus letter for your board review.  -HEARING LOSS: Hearing loss is a partial or total inability to hear. You are scheduled to be fitted for a hearing aid in your right ear next week. Please continue with the planned hearing aid fitting.  -HEART MURMUR: A heart murmur is an unusual sound heard between heartbeats, often indicating an underlying heart condition. We will order an echocardiogram to get a baseline assessment of your heart's structure and function.  -VACCINATIONS: Vaccinations help protect you from various infectious diseases. You recently received a COVID-19 booster and plan to get the Shingles vaccine and possibly the RSV vaccine. Please proceed with the planned vaccinations.  -GENERAL HEALTH MAINTENANCE: Continue with your current medications and treatments as prescribed. Follow up after you have completed your vaccinations and hearing  aid fitting.  INSTRUCTIONS:  Please follow up after you have completed your vaccinations and hearing aid fitting. Additionally, we will schedule an echocardiogram to assess your heart murmur.

## 2023-06-29 NOTE — Progress Notes (Signed)
 Subjective:     Patient ID: Austin Faulkner, male    DOB: Apr 17, 1956, 68 y.o.   MRN: [id number]  Chief Complaint  Patient presents with   Follow-up    Patient here for follow up after sleep study    HPI  Discussed the use of AI scribe software for clinical note transcription with the patient, who gave verbal consent to proceed.  History of Present Illness   Austin Faulkner is a 68 year old male with PTSD who presents for a nexus letter and psychological support.  He is seeking assistance for obtaining a nexus letter for a board review due to PTSD. His current [place] psychiatrist and psychologist are unable to provide the necessary documentation, prompting the need for a psychologist outside the [place] system. He has been with his current psychologist for seven years and is apprehensive about transitioning to a new provider. He has collected statements from family members and others to support his case.  He describes experiencing increased anxiety and panic attacks, particularly over the past two weeks, due to the impending departure of his current mental health providers. He has been on edge and has had difficulty managing his symptoms without regular appointments. His wife has been supportive, but the situation has been challenging.  He recently completed a sleep study, which confirmed issues with his sleep. He has started using a CPAP machine as of last night, which he hopes will improve his sleep quality and reduce his nighttime anxiety. He is also taking trazodone  to aid with sleep.  He has been dealing with significant family stress, including caring for multiple family members with health issues, which has contributed to his emotional exhaustion. His mother recently had a stroke, and he has been involved in her care as well as other family responsibilities.  He is managing other health concerns, including hearing loss for which he is getting fitted for a new hearing aid next week. He has  a history of a heart murmur, which has been present all his life. He recently received a COVID booster and plans to get a shingles vaccine and possibly an RSV vaccine soon.          Health Maintenance Due  Topic Date Due   Zoster Vaccines- Shingrix (1 of 2) Never done   Pneumonia Vaccine 60+ Years old (2 of 2 - PCV) 01/08/2021    Past Medical History:  Diagnosis Date   Asthma    COPD (chronic obstructive pulmonary disease) (HCC)    Depression    PTSD   Diverticulitis    Diverticulitis of colon with perforation 03/04/2016   Dyspnea    secondary to seasonal allergies; or illness   GERD (gastroesophageal reflux disease)    Heart murmur    slight since birth   Hypertension    Pneumonia yrs ago   Rheumatoid arthritis (HCC)    Seasonal allergies    Shoulder dislocation     Past Surgical History:  Procedure Laterality Date   INGUINAL HERNIA REPAIR Bilateral 07/23/2021   Procedure: LAPAROSCOPIC BILATERAL INGUINAL HERNIA REPAIR WITH MESH;  Surgeon: Kinsinger, Herlene Righter, MD;  Location: [facility];  Service: General;  Laterality: Bilateral;   knee arthroscopy and cartlidge repair Bilateral    5 on right and 5 on left   NECK SURGERY  2022   A,C,D,F C4/5   SHOULDER ARTHROSCOPY Left 2008   SMALL INTESTINE SURGERY  2017   small intestine resection   TOTAL KNEE ARTHROPLASTY Bilateral 2002  2010    Family History  Problem Relation Age of Onset   Lung cancer Mother    Heart attack Father 41       healthier than I am   Lupus Sister    Multiple myeloma Sister        BMT at [facility]   Stroke Paternal Grandmother    Alpha-1 antitrypsin deficiency Neg Hx     Social History   Socioeconomic History   Marital status: Married    Spouse name: Tilton   Number of children: 3   Years of education: Not on file   Highest education level: Not on file  Occupational History   Occupation: retired  Tobacco Use   Smoking status: Never    Passive exposure: Yes   Smokeless tobacco: Never   Vaping Use   Vaping status: Never Used  Substance and Sexual Activity   Alcohol use: No   Drug use: No   Sexual activity: Yes    Partners: Female  Other Topics Concern   Not on file  Social History Narrative   Not on file   Social Drivers of Health   Financial Resource Strain: Low Risk  (09/14/2021)   Overall Financial Resource Strain (CARDIA)    Difficulty of Paying Living Expenses: Not hard at all  Food Insecurity: No Food Insecurity (06/08/2022)   Hunger Vital Sign    Worried About Running Out of Food in the Last Year: Never true    Ran Out of Food in the Last Year: Never true  Transportation Needs: No Transportation Needs (06/08/2022)   PRAPARE - Administrator, Civil Service (Medical): No    Lack of Transportation (Non-Medical): No  Physical Activity: Inactive (09/14/2021)   Exercise Vital Sign    Days of Exercise per Week: 0 days    Minutes of Exercise per Session: 0 min  Stress: Stress Concern Present (09/14/2021)   [employer] of Occupational Health - Occupational Stress Questionnaire    Feeling of Stress : To some extent  Social Connections: Socially Integrated (09/14/2021)   Social Connection and Isolation Panel [NHANES]    Frequency of Communication with Friends and Family: More than three times a week    Frequency of Social Gatherings with Friends and Family: More than three times a week    Attends Religious Services: More than 4 times per year    Active Member of [employer] or Organizations: Yes    Attends Engineer, Structural: More than 4 times per year    Marital Status: Married  Catering Manager Violence: Not At Risk (09/14/2021)   Humiliation, Afraid, Rape, and Kick questionnaire    Fear of Current or Ex-Partner: No    Emotionally Abused: No    Physically Abused: No    Sexually Abused: No    Outpatient Medications Prior to Visit  Medication Sig Dispense Refill   albuterol  (VENTOLIN  HFA) 108 (90 Base) MCG/ACT inhaler Inhale 2 puffs  into the lungs every 4 (four) hours as needed for wheezing or shortness of breath. 18 g 1   cetirizine  (ZYRTEC ) 10 MG tablet Take 1 tablet (10 mg total) by mouth daily as needed for allergies (Can take an extra dose during flare ups.). 180 tablet 1   EPINEPHrine  0.3 mg/0.3 mL IJ SOAJ injection Inject 0.3 mg into the muscle as needed for anaphylaxis.     ferrous gluconate (FERGON) 324 MG tablet Take 324 mg by mouth at bedtime.     fluocinonide cream (LIDEX) 0.05 %  Apply 1 Application topically as needed.     fluticasone -salmeterol (ADVAIR HFA) 115-21 MCG/ACT inhaler Inhale 2 puffs into the lungs 2 (two) times daily. 36 g 1   folic acid (FOLVITE) 1 MG tablet Take 1 tablet by mouth daily.     hydrochlorothiazide  (HYDRODIURIL ) 25 MG tablet Take 25 mg by mouth daily.     hydroxychloroquine  (PLAQUENIL ) 200 MG tablet Take 200 mg by mouth 2 (two) times daily.     ipratropium-albuterol  (DUONEB) 0.5-2.5 (3) MG/3ML SOLN Take 3 mLs by nebulization every 6 (six) hours as needed. 360 mL    ketotifen (ZADITOR) 0.035 % ophthalmic solution Place 1 drop into both eyes 2 (two) times daily.     leflunomide (ARAVA) 20 MG tablet Take 20 mg by mouth at bedtime.     meloxicam  (MOBIC ) 15 MG tablet Take 15 mg by mouth daily.     methotrexate (RHEUMATREX) 2.5 MG tablet TAKE THREE TABLETS BY MOUTH EVERY 7 DAYS RHEUMATOID ARTHRITIS     montelukast  (SINGULAIR ) 10 MG tablet Take 10 mg by mouth at bedtime.     Multiple Vitamin (MULTIVITAMIN WITH MINERALS) TABS tablet Take 1 tablet by mouth daily.     omeprazole (PRILOSEC) 20 MG capsule Take 20 mg by mouth daily before breakfast.     PRESCRIPTION MEDICATION 2 (two) times a week. Allergy injection     Probiotic Product (PROBIOTIC DAILY PO) Take 1 capsule by mouth daily before breakfast. 100 billion     Spacer/Aero-Holding Chambers DEVI 1 Device by Does not apply route as directed. 1 each 1   sulfaSALAzine  (AZULFIDINE ) 500 MG tablet Take 500 mg by mouth 2 (two) times daily.      tadalafil (CIALIS) 20 MG tablet Take 20 mg by mouth daily as needed for erectile dysfunction.     tamsulosin  (FLOMAX ) 0.4 MG CAPS capsule Take 0.4 mg by mouth at bedtime.     Tiotropium Bromide Monohydrate  (SPIRIVA  RESPIMAT) 1.25 MCG/ACT AERS Inhale 2 puffs into the lungs 2 (two) times daily.     traZODone  (DESYREL ) 100 MG tablet Take 100 mg by mouth at bedtime.     TURMERIC CURCUMIN PO Take 900 mg by mouth daily before breakfast.     No facility-administered medications prior to visit.    Allergies  Allergen Reactions   Azithromycin Other (See Comments)    Patient reports does not help with infections.    Sildenafil Other (See Comments)    Other reaction(s): Headache, Visual disturbance, Other (See Comments)  Viagra, doesn't help  Other reaction(s): Headache, Pain in eye  Other Reaction(s): Other (See Comments)   Tape Rash    Must use paper tape  Prefers paper tape, others bruise skin    ROS See HPI    Objective:    Physical Exam Constitutional:      General: He is not in acute distress.    Appearance: He is well-developed.  HENT:     Head: Normocephalic and atraumatic.  Cardiovascular:     Rate and Rhythm: Normal rate and regular rhythm.     Heart sounds: Murmur heard.     Systolic murmur is present with a grade of 2/6.  Pulmonary:     Effort: Pulmonary effort is normal. No respiratory distress.     Breath sounds: Normal breath sounds. No wheezing or rales.  Skin:    General: Skin is warm and dry.  Neurological:     Mental Status: He is alert and oriented to person, place, and time.  Psychiatric:  Behavior: Behavior normal.        Thought Content: Thought content normal.      BP 138/77 (BP Location: Right Arm, Patient Position: Sitting, Cuff Size: Normal)   Pulse (!) 101   Temp 98.6 F (37 C) (Oral)   Resp 16   Ht 5' 9 (1.753 m)   Wt 181 lb (82.1 kg)   SpO2 99%   BMI 26.73 kg/m  Wt Readings from Last 3 Encounters:  06/29/23 181 lb (82.1 kg)   05/16/23 186 lb (84.4 kg)  01/06/23 182 lb 8 oz (82.8 kg)       Assessment & Plan:   Problem List Items Addressed This Visit       Unprioritized   Rheumatoid arthritis of multiple sites without rheumatoid factor (HCC)   Stable, management per [facility] rheumatology.       PTSD (post-traumatic stress disorder) - Primary    Reports high levels of stress and anxiety. Current psychologist and psychiatrist are leaving, causing additional stress. -Referral to in-house psychologist for ongoing management and to provide a nexus letter for the patient's board review.       Relevant Orders   Ambulatory referral to Psychology   OSA (obstructive sleep apnea)   New diagnosis. Just started CPAP, seems to be tolerating well.       Murmur   I don't see a recent echo. Will obtain echo for further evaluation.       Relevant Orders   ECHOCARDIOGRAM COMPLETE   Hearing loss    Scheduled to be fitted for a hearing aid in the right ear next week. -Continue with planned hearing aid fitting.       I am having Evalene CANDIE Silvan maintain his omeprazole, TURMERIC CURCUMIN PO, multivitamin with minerals, hydroxychloroquine , tamsulosin , traZODone , tadalafil, Spiriva  Respimat, ipratropium-albuterol , EPINEPHrine , hydrochlorothiazide , sulfaSALAzine , PRESCRIPTION MEDICATION, leflunomide, meloxicam , Probiotic Product (PROBIOTIC DAILY PO), methotrexate, ferrous gluconate, fluocinonide cream, folic acid, ketotifen, montelukast , fluticasone -salmeterol, albuterol , cetirizine , and Spacer/Aero-Holding Chambers.  No orders of the defined types were placed in this encounter.

## 2023-06-30 ENCOUNTER — Ambulatory Visit (INDEPENDENT_AMBULATORY_CARE_PROVIDER_SITE_OTHER): Payer: No Typology Code available for payment source

## 2023-06-30 DIAGNOSIS — J309 Allergic rhinitis, unspecified: Secondary | ICD-10-CM | POA: Diagnosis not present

## 2023-07-05 ENCOUNTER — Encounter: Payer: Self-pay | Admitting: Family

## 2023-07-08 ENCOUNTER — Ambulatory Visit (INDEPENDENT_AMBULATORY_CARE_PROVIDER_SITE_OTHER): Payer: No Typology Code available for payment source | Admitting: *Deleted

## 2023-07-08 DIAGNOSIS — J309 Allergic rhinitis, unspecified: Secondary | ICD-10-CM

## 2023-07-11 ENCOUNTER — Telehealth: Payer: Self-pay | Admitting: Family

## 2023-07-11 NOTE — Telephone Encounter (Signed)
 Pt dropped off copy of letter that provider sent out for him, pt stated needed letter to have indication were it is highlighted in the area of the letter ([employer] [place] AO-180 TO [employer] AR- 8) and also to change the are were it says [employer] [place] in 1986 to change it to [employer] AR -8. Copy of letter is attached and marked in the area that is needed to be changed. Call pt when document ready to pick up at [facility] [phone]. Letter put at front office tray under providers name.

## 2023-07-13 ENCOUNTER — Ambulatory Visit (INDEPENDENT_AMBULATORY_CARE_PROVIDER_SITE_OTHER): Payer: No Typology Code available for payment source

## 2023-07-13 DIAGNOSIS — J309 Allergic rhinitis, unspecified: Secondary | ICD-10-CM | POA: Diagnosis not present

## 2023-07-14 ENCOUNTER — Ambulatory Visit: Payer: Medicare Other | Admitting: Allergy & Immunology

## 2023-07-21 ENCOUNTER — Telehealth: Payer: Self-pay | Admitting: Neurology

## 2023-07-21 NOTE — Telephone Encounter (Signed)
 Copied from CRM [id number]. Topic: Clinical - Request for Lab/Test Order >> Jul 21, 2023  8:39 AM Josefa Half C wrote: Reason for CRM: Kylie([facility]) called to ask if the new authorization(#[id number]) using the patients [facility] will have a decision by 07/22/2023 before the patients scheduled appointment. Please follow up with Kylie([facility]) [phone]

## 2023-07-21 NOTE — Telephone Encounter (Signed)
 Called Austin Faulkner at [facility] she reports patient communicated by email that he does not want to file with the [facility] anymore.

## 2023-07-22 ENCOUNTER — Ambulatory Visit (HOSPITAL_BASED_OUTPATIENT_CLINIC_OR_DEPARTMENT_OTHER): Payer: Medicare Other

## 2023-07-25 ENCOUNTER — Ambulatory Visit: Payer: No Typology Code available for payment source | Admitting: Psychology

## 2023-07-25 DIAGNOSIS — F4310 Post-traumatic stress disorder, unspecified: Secondary | ICD-10-CM

## 2023-07-25 DIAGNOSIS — F32A Depression, unspecified: Secondary | ICD-10-CM

## 2023-07-25 NOTE — Progress Notes (Unsigned)
 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   Mental Status Exam: Appearance:   Well Groomed     Behavior:  Appropriate  Motor:  Normal  Speech/Language:   Normal Rate  Affect:  Appropriate  Mood:  normal  Thought process:  normal  Thought content:    WNL  Sensory/Perceptual disturbances:    WNL  Orientation:  oriented to person, place, and time/date  Attention:  Good  Concentration:  Good  Memory:  WNL  Fund of knowledge:   Good  Insight:    Good  Judgment:   Good  Impulse Control:  Good   Reported Symptoms:  Feeling nervous and on edge, not being able to control worrying, trouble relaxing, hard to sit still, sleep trouble, poor appetite, being so fidgety and restless trouble sitting still.   Risk Assessment: Danger to Self:  No Self-injurious Behavior: No Danger to Others: No Duty to Warn:no Physical Aggression / Violence:No  Access to Firearms a concern: No  Gang Involvement:No  Patient / guardian was educated about steps to take if suicide or homicide risk level increases between visits: yes While future psychiatric events cannot be accurately predicted, the patient does not currently require acute inpatient psychiatric care and does not currently meet [facility] involuntary commitment criteria.  Substance Abuse History: Current substance abuse: No     Caffeine: 1.5 cups coffee a day Tobacco: N/A   Alcohol: One beer every two weeks Substance use: every once in a while I'll have an edible at bedtime  Past Psychiatric History:   Previous psychological history is significant for depression Outpatient  Providers: psychologist at [facility] History of Psych Hospitalization: No  Psychological Testing:  No    Abuse History:  Victim of: No. Report needed: No. Victim of Neglect:No. Perpetrator of  No   Witness / Exposure to Domestic Violence: No   Protective Services Involvement: No  Witness to [employer] Violence:  No   Family History:  Family History  Problem Relation Age of Onset   Lung cancer Mother    Heart attack Father 72       "healthier than I am"   Lupus Sister    Multiple myeloma Sister        BMT at [facility]   Stroke Paternal Grandmother    Alpha-1 antitrypsin deficiency Neg Hx     Living situation: the patient lives with their spouse  Sexual Orientation: Straight  Relationship Status: married 38 years If a parent, number of children / ages:2 step-sons ages 79, 86, biological daughter age 44. Grandson age 82, granddaughter age 34.  Support Systems: spouse, God, handful of close friends  Financial Stress:  No   Income/Employment/Disability: Optometrist Service: Yes   Educational History: Education: high school diploma/GED  Religion/Sprituality/World View: Baptist  Any cultural differences that may affect / interfere with treatment:  not applicable   Recreation/Hobbies: gardening  Stressors: military trauma, health problems, loss of family members, family conflict  Strengths: Supportive Relationships, Hopefulness, and Self Advocate  Barriers:  N/A   Legal History: Pending legal issue / charges: The patient has no significant history of legal  issues. History of legal issue / charges:  N/A  Medical History/Surgical History: not reviewed Past Medical History:  Diagnosis Date   Asthma    COPD (chronic obstructive pulmonary disease) (HCC)    Depression    PTSD   Diverticulitis    Diverticulitis of colon with perforation 03/04/2016   Dyspnea    secondary to seasonal allergies; or illness   GERD (gastroesophageal reflux disease)    Heart murmur     slight since birth   Hypertension    Pneumonia yrs ago   Rheumatoid arthritis (HCC)    Seasonal allergies    Shoulder dislocation     Past Surgical History:  Procedure Laterality Date   INGUINAL HERNIA REPAIR Bilateral 07/23/2021   Procedure: LAPAROSCOPIC BILATERAL INGUINAL HERNIA REPAIR WITH MESH;  Surgeon: Kinsinger, De Blanch, MD;  Location: [facility];  Service: General;  Laterality: Bilateral;   knee arthroscopy and cartlidge repair Bilateral    5 on right and 5 on left   NECK SURGERY  2022   A,C,D,F C4/5   SHOULDER ARTHROSCOPY Left 2008   SMALL INTESTINE SURGERY  2017   small intestine resection   TOTAL KNEE ARTHROPLASTY Bilateral 2002   2010    Medications: Current Outpatient Medications  Medication Sig Dispense Refill   albuterol (VENTOLIN HFA) 108 (90 Base) MCG/ACT inhaler Inhale 2 puffs into the lungs every 4 (four) hours as needed for wheezing or shortness of breath. 18 g 1   cetirizine (ZYRTEC) 10 MG tablet Take 1 tablet (10 mg total) by mouth daily as needed for allergies (Can take an extra dose during flare ups.). 180 tablet 1   EPINEPHrine 0.3 mg/0.3 mL IJ SOAJ injection Inject 0.3 mg into the muscle as needed for anaphylaxis.     ferrous gluconate (FERGON) 324 MG tablet Take 324 mg by mouth at bedtime.     fluocinonide cream (LIDEX) 0.05 % Apply 1 Application topically as needed.     fluticasone-salmeterol (ADVAIR HFA) 115-21 MCG/ACT inhaler Inhale 2 puffs into the lungs 2 (two) times daily. 36 g 1   hydrochlorothiazide (HYDRODIURIL) 25 MG tablet Take 25 mg by mouth daily.     hydroxychloroquine (PLAQUENIL) 200 MG tablet Take 200 mg by mouth 2 (two) times daily.     ipratropium-albuterol (DUONEB) 0.5-2.5 (3) MG/3ML SOLN Take 3 mLs by nebulization every 6 (six) hours as needed. 360 mL    ketotifen (ZADITOR) 0.035 % ophthalmic solution Place 1 drop into both eyes 2 (two) times daily.     leflunomide (ARAVA) 20 MG tablet Take 20 mg by mouth at bedtime.     meloxicam  (MOBIC) 15 MG tablet Take 15 mg by mouth daily.     methotrexate (RHEUMATREX) 2.5 MG tablet TAKE THREE TABLETS BY MOUTH EVERY 7 DAYS RHEUMATOID ARTHRITIS     montelukast (SINGULAIR) 10 MG tablet Take 10 mg by mouth at bedtime.     Multiple Vitamin (MULTIVITAMIN WITH MINERALS) TABS tablet Take 1 tablet by mouth daily.     omeprazole (PRILOSEC) 20 MG capsule Take 20 mg by mouth daily before breakfast.     PRESCRIPTION MEDICATION 2 (two) times a week. Allergy injection     Probiotic Product (PROBIOTIC DAILY PO) Take 1 capsule by mouth daily before breakfast. 100 billion     Spacer/Aero-Holding Chambers DEVI 1 Device by Does not apply route as directed. 1 each 1   sulfaSALAzine (AZULFIDINE) 500 MG tablet Take 500 mg by mouth 2 (two) times daily.  tadalafil (CIALIS) 20 MG tablet Take 20 mg by mouth daily as needed for erectile dysfunction.     tamsulosin (FLOMAX) 0.4 MG CAPS capsule Take 0.4 mg by mouth at bedtime.     Tiotropium Bromide Monohydrate (SPIRIVA RESPIMAT) 1.25 MCG/ACT AERS Inhale 2 puffs into the lungs 2 (two) times daily.     traZODone (DESYREL) 100 MG tablet Take 100 mg by mouth at bedtime.     TURMERIC CURCUMIN PO Take 900 mg by mouth daily before breakfast.     No current facility-administered medications for this visit.    Allergies  Allergen Reactions   Azithromycin Other (See Comments)    Patient reports does not help with infections.    Sildenafil Other (See Comments)    Other reaction(s): Headache, Visual disturbance, Other (See Comments)  Viagra, doesn't help  Other reaction(s): Headache, Pain in eye  Other Reaction(s): Other (See Comments)   Tape Rash    Must use paper tape  Prefers paper tape, others bruise skin    Diagnoses:  Depression, unspecified depression type 32.A Post traumatic stress disorder (PTSD) 43.1  [facility]iatric [facility]: [facility] , [facility]  Plan of Care: OPT  Narrative:  [name] participated from office with therapist and  consented to treatment. We reviewed the limits of confidentiality prior to the start of the evaluation. [name] expressed understanding and agreement to proceed.   Patient is a 68 year old male who presented for an initial assessment. Patient was referred by NP Peggyann Juba. Patient reported the following symptoms: Feeling nervous and on edge, not being able to control worrying, trouble relaxing, hard to sit still, sleep trouble, poor appetite, being so fidgety and restless trouble sitting still. Patient denied current suicidal ideation. Patient denied current and past homicidal ideation, and symptoms of psychosis.  Patient denied current tobacco use, and reported drinking one beer every two weeks, and "every once in a while I'll have an edible at bedtime". Patient reported current stressors as the trauma he suffered in the [employer], his health problems, the death of close family members, and family conflict with adult children. Patient identified current supports as his wife, God, and a couple of good friends. Patient reported his military trauma as a constant reminder for him of how close he came to dying, and how he wished he could have done more for his shipmates. Additional losses include losing family members. Patient's mother died 28 years ago from lung cancer, his sister died 4 years ago from cancer (age 87), and his father has early onset dementia. Patient's younger sister, Tarri Glenn, is co-caretaker of their father, and patient helps out as much as he can. Patient's step mother and mother in law also have challenges. Patient has three adult children, 2 step sons, age 67 step son Thayer Ohm, has down syndrome, age 22 step son Rebecca Eaton got involved with wrong crowd, in [employer] x 2 years, left due to mental health, and his age 3 yo biological daughter named Tuesday. Patient also has grandchildren, Ava Hilda Lias, age 63 yo grand daughter (Tuesday's kid), and age 64 Jaelin,  gay grandson, with mental illness has just  moved in with pt and pt's wife. Jaelin recently applied to [employer]. Patient's mother in law has "mental health issues" hx in [place], with two family member who also have mental health issues. 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 Patient had some challenges at that job because of his  military trauma. It is recommended that patient participate in individual psychotherapy on a weekly/biweekly basis.   A follow-up was scheduled to create a treatment plan and begin treatment. Therapist answered  and all questions during the evaluation and contact information was provided.     Helyn Numbers

## 2023-07-26 ENCOUNTER — Ambulatory Visit (INDEPENDENT_AMBULATORY_CARE_PROVIDER_SITE_OTHER)

## 2023-07-26 DIAGNOSIS — J309 Allergic rhinitis, unspecified: Secondary | ICD-10-CM

## 2023-08-05 ENCOUNTER — Ambulatory Visit (INDEPENDENT_AMBULATORY_CARE_PROVIDER_SITE_OTHER): Payer: Self-pay

## 2023-08-05 DIAGNOSIS — J309 Allergic rhinitis, unspecified: Secondary | ICD-10-CM

## 2023-08-09 ENCOUNTER — Encounter: Payer: Self-pay | Admitting: Family

## 2023-08-09 ENCOUNTER — Ambulatory Visit (HOSPITAL_BASED_OUTPATIENT_CLINIC_OR_DEPARTMENT_OTHER)
Admission: RE | Admit: 2023-08-09 | Discharge: 2023-08-09 | Disposition: A | Discharge status: Home / Self Care | Payer: Medicare Other | Source: Ambulatory Visit | Attending: Family | Admitting: Family

## 2023-08-09 ENCOUNTER — Ambulatory Visit (INDEPENDENT_AMBULATORY_CARE_PROVIDER_SITE_OTHER): Admitting: Psychology

## 2023-08-09 DIAGNOSIS — F4310 Post-traumatic stress disorder, unspecified: Secondary | ICD-10-CM | POA: Diagnosis not present

## 2023-08-09 DIAGNOSIS — F32A Depression, unspecified: Secondary | ICD-10-CM

## 2023-08-09 DIAGNOSIS — R011 Cardiac murmur, unspecified: Secondary | ICD-10-CM | POA: Insufficient documentation

## 2023-08-09 LAB — ECHOCARDIOGRAM COMPLETE
AR max vel: 2.27 cm2
AV Area VTI: 2.27 cm2
AV Area mean vel: 2.3 cm2
AV Mean grad: 8 mmHg
AV Peak grad: 14.6 mmHg
AV Vena cont: 0.2 cm
Ao pk vel: 1.91 m/s
Area-P 1/2: 3.42 cm2
Calc EF: 65.4 %
MV M vel: 4.98 m/s
MV Peak grad: 99 mmHg
MV Vena cont: 0.1 cm
P 1/2 time: 2126 ms
Radius: 0.2 cm
S' Lateral: 2.7 cm
Single Plane A2C EF: 63.5 %
Single Plane A4C EF: 64.4 %

## 2023-08-09 NOTE — Progress Notes (Signed)
 [facility] Counselor/Therapist Progress Note  Patient ID: Austin Faulkner, MRN: [id number]    Date: 08/09/23  Time Spent: 10:00 am - 10: 55 am : 55 minutes  Treatment Type: Individual Therapy.  Reported Symptoms: feeling nervous and on edge, not being able to control worrying, trouble relaxing, hard to sit still, sleep trouble, poor appetite, being so fidgety and restless trouble sitting still.   Mental Status Exam: Appearance:  Well Groomed     Behavior: Appropriate  Motor: Normal  Speech/Language:  Normal Rate  Affect: Appropriate  Mood: normal  Thought process: normal  Thought content:   WNL  Sensory/Perceptual disturbances:   WNL  Orientation: oriented to person, place, and time/date  Attention: Good  Concentration: Good  Memory: WNL  Fund of knowledge:  Good  Insight:   Good  Judgment:  Good  Impulse Control: Good   Risk Assessment: Danger to Self:  No Self-injurious Behavior: No Danger to Others: No Duty to Warn:no Physical Aggression / Violence:No  Access to Firearms a concern: No  Gang Involvement:No   Subjective:   Austin Faulkner participated in the session, in person in the office with the therapist, and consented to treatment Austin Faulkner reviewed the events of the past week.   Patient was receptive to talking about goals and noted that he has a lot of goals. Patient discussed his day to day schedule and his need to take time for himself to refill himself. Patient shared memories from his [employer] career and how he continues to be impacted by those intrusive thoughts to this day. We reviewed numerous treatment approaches including CBT, BA, Problem Solving, and Solution focused therapy. Psych-education regarding the Kolbie's diagnosis of Depression, unspecified depression type 32.A, and Post traumatic stress disorder (PTSD) 43.1was provided during the session.   We discussed Austin Faulkner's goals treatment goals which include to be outside and  structure his time and let everyone else know his schedule, set limits with wife, ex: not engaging with her, taking some time away, time away, about 7 days.   Austin Faulkner provided verbal approval of the treatment plan.   Interventions: Psycho-education & Goal Setting.   Diagnosis:    Depression, unspecified depression type 32.A Post traumatic stress disorder (PTSD) 43.1  [facility]  Treatment Plan:  Client Abilities/Strengths Austin Faulkner is very articulate, expressive, self-aware, and motivated for change.   Support System: Spouse, God, handful of friends  Client Treatment Preferences OPT  Client Statement of Needs Austin Faulkner would like to structure his time and let everyone else know his schedule, set limits with his wife to help both of them, and take some time away so he can "fill back up".   Treatment Level Weekly/Biweekly  Symptoms  Anxiety: feeling nervous and on edge, not being able to control worrying, trouble relaxing, hard to sit still, sleep trouble, poor appetite, being so fidgety and restless trouble sitting still. (Status: maintained)  Goals:   Corney experiences symptoms of depression and PTSD.  Treatment plan signed and available on s-drive:  No, pending signature.     Target Date: 07/24/24 Frequency: Weekly/Biweekly  Progress: 0 Modality: individual    Therapist will provide referrals for additional resources as appropriate.  Therapist will provide psycho-education regarding Birch's diagnosis and corresponding treatment approaches and interventions. Austin Faulkner will support the patient's ability to achieve the goals identified. will employ CBT, BA, Problem-solving, Solution Focused, Mindfulness,  coping skills, & other evidenced-based practices will be used to  promote progress towards healthy functioning to help manage decrease symptoms associated with their diagnosis.   Reduce overall level, frequency, and intensity of the  feelings of depression, anxiety and panic evidenced by decreased overall symptoms  from 6 to 7 days/week to 0 to 1 days/week per client report for at least 3 consecutive months. Verbally express understanding of the relationship between feelings of depression and its impact on thinking patterns and behaviors. Verbalize an understanding of the role that distorted thinking plays in creating fears, excessive worry, and ruminations.  Austin Faulkner participated in the creation of the treatment plan)    Austin Faulkner

## 2023-08-10 ENCOUNTER — Ambulatory Visit (INDEPENDENT_AMBULATORY_CARE_PROVIDER_SITE_OTHER)

## 2023-08-10 DIAGNOSIS — J309 Allergic rhinitis, unspecified: Secondary | ICD-10-CM | POA: Diagnosis not present

## 2023-08-18 ENCOUNTER — Encounter: Payer: Self-pay | Admitting: Allergy & Immunology

## 2023-08-18 ENCOUNTER — Ambulatory Visit: Payer: Medicare Other | Admitting: Allergy & Immunology

## 2023-08-18 ENCOUNTER — Other Ambulatory Visit: Payer: Self-pay

## 2023-08-18 VITALS — BP 132/76 | HR 98 | Temp 97.3°F | Resp 12

## 2023-08-18 DIAGNOSIS — J302 Other seasonal allergic rhinitis: Secondary | ICD-10-CM | POA: Diagnosis not present

## 2023-08-18 DIAGNOSIS — J4540 Moderate persistent asthma, uncomplicated: Secondary | ICD-10-CM

## 2023-08-18 DIAGNOSIS — J3089 Other allergic rhinitis: Secondary | ICD-10-CM

## 2023-08-18 NOTE — Patient Instructions (Addendum)
 1. Seasonal and perennial allergic rhinitis - You are in your [employer], which is the most concentrated form. - We can add epinephrine rinses to your vials to decrease the itching and swelling around the injection sites.  - You can increase the cetirizine to twice daily during the worst times of the year.  - Continue with the Flonase daily.  2. Moderate persistent asthma, uncomplicated - Lung testing looks great today. - We are not going to make any medication changes. - Symptoms seem to be under excellent control with the recurrent regimen.  - Daily controller medication(s): Advair 115/66mcg two puffs twice daily with spacer - Prior to physical activity: albuterol 2 puffs 10-15 minutes before physical activity. - Rescue medications: albuterol 4 puffs every 4-6 hours as needed - Asthma control goals:  * Full participation in all desired activities (may need albuterol before activity) * Albuterol use two time or less a week on average (not counting use with activity) * Cough interfering with sleep two time or less a month * Oral steroids no more than once a year * No hospitalizations  3. Return in about 1 year (around 08/17/2024). You can have the follow up appointment with Dr. Dellis Anes or a Nurse Practicioner (our Nurse Practitioners are excellent and always have Physician oversight!).    Please inform us of any Emergency Department visits, hospitalizations, or changes in symptoms. Call us before going to the ED for breathing or allergy symptoms since we might be able to fit you in for a sick visit. Feel free to contact us anytime with any questions, problems, or concerns.  It was a pleasure to see you again today!  Websites that have reliable patient information: 1. American Academy of Asthma, Allergy, and Immunology: www.aaaai.org 2. Food Allergy Research and Education (FARE): foodallergy.org 3. Mothers of Asthmatics: http://www.asthmacommunitynetwork.org 4. American College of  Allergy, Asthma, and Immunology: www.acaai.org      "Like" [place] on Facebook and Instagram for our latest updates!      A healthy democracy works best when [employer] participate! Make sure you are registered to vote! If you have moved or changed any of your contact information, you will need to get this updated before voting! Scan the QR codes below to learn more!

## 2023-08-18 NOTE — Progress Notes (Unsigned)
 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 Celine Mans   Plan/Recommendations:   1. Seasonal and perennial allergic rhinitis - You are in your [employer], which is the most concentrated form. - We can add epinephrine rinses to your vials to decrease the itching and swelling around the injection sites.  - You can increase the cetirizine to twice daily during the worst times of the year.  - Continue with the Flonase daily.  2. Moderate persistent asthma, uncomplicated - Lung testing looks great today. - We are not going to make any medication changes. - Symptoms seem to be under excellent control with the recurrent regimen.  - Daily controller medication(s): Advair 115/35mcg two puffs twice daily with spacer - Prior to physical activity: albuterol 2 puffs 10-15 minutes before physical activity. - Rescue medications: albuterol 4 puffs every 4-6 hours as needed - Asthma control goals:  * Full participation in all desired activities (may need albuterol before activity) * Albuterol use two time or less a week on average (not counting use with activity) * Cough interfering with sleep two time or less a month * Oral steroids no more than once a year * No hospitalizations  3. Return in about 6 months (around 02/18/2024). You can have the follow up appointment with Dr. Dellis Anes or a Nurse Practicioner (our Nurse Practitioners are excellent and always have Physician oversight!).   Subjective:   Austin Faulkner is a 68 y.o. male presenting today for follow up of  Chief Complaint  Patient presents with   Allergies   Asthma    Austin Faulkner has a history of the  following: Patient Active Problem List   Diagnosis Date Noted   OSA (obstructive sleep apnea) 06/29/2023   PTSD (post-traumatic stress disorder) 06/29/2023   Hearing loss 06/29/2023   Preoperative examination 07/17/2021   Anemia 07/17/2021   Weight loss 04/21/2021   Cervical radiculopathy 04/21/2021   Bilateral inguinal hernia without obstruction or gangrene 04/21/2021   Depression 04/26/2019   Chronic obstructive pulmonary disease (HCC) 04/26/2019   Benign prostatic hyperplasia 04/26/2019   Diverticular disease 06/23/2016   Essential hypertension 03/05/2016   Rheumatoid arthritis of multiple sites without rheumatoid factor (HCC) 03/05/2016   External hemorrhoid, bleeding 02/14/2012   GERD (gastroesophageal reflux disease) 02/14/2012   Chronic back pain 07/03/2010   TESTICULAR MASS, LEFT 02/25/2010   DEGENERATIVE JOINT DISEASE 11/13/2009   Murmur 11/11/2009   Moderate persistent asthma, uncomplicated 07/21/2009    History obtained from: chart review and patient.  Discussed the use of AI scribe software for clinical note transcription with the patient and/or guardian, who gave verbal consent to proceed.  Austin Faulkner is a 68 y.o. male presenting for a follow up visit.  He was last seen in August 2024.  At that time, he decided to start allergy shots.  We continue with cetirizine and Flonase.  Asthma was under good control with Advair 115/21 mcg 2 puffs twice daily as well as albuterol as needed.  Since last visit,    Discussed the use of AI scribe software for clinical note transcription with the patient, who gave verbal consent to proceed.  History of Present Illness   Austin  Austin Faulkner is a 68 year old male with asthma who presents for follow-up on allergy shots.  He is experiencing swelling at the site of his allergy shots, particularly with the red dose, which has been bothersome the last three times. Despite this, he has not required an epinephrine injection. He is nearing the  completion of his allergy shot regimen and is almost at the top red dose.  The last seven days have been particularly miserable due to outdoor allergens, which he attributes to his backyard environment. He finds it difficult to stay out of the yard as it is a significant part of his life. He is managing his symptoms with Zyrtec, although he feels it is not fully effective. He continues to use Flonase and Advair, taking two puffs twice a day. He has not experienced any asthma attacks recently and his breathing is good. He has had to take breathing treatments when using his albuterol more than three times a day, which he tries to avoid. These treatments help him feel better for a couple of days. He has not been to the hospital recently and has all necessary equipment at home, including breathing machines. No recent sinus infections or need for antibiotics, although he keeps a Z-Pak at home for prophylactic use if needed.  He underwent a sleep study and now uses a CPAP mask every night, which is helping manage his condition.  He mentions having PTSD and is on medication for it. His condition is stabilizing with the current treatment regimen.  He has had various imaging studies, including MRIs and CT scans, due to a pulmonary nodule, which was determined to be likely scar tissue. He has not had any recent infections or need for antibiotics.        Asthma/Respiratory Symptom History: ***  Allergic Rhinitis Symptom History: ***  Devyn is on allergen immunotherapy. He receives two injections. Immunotherapy script #1 contains trees, weeds, grasses, cat, and dog. He currently receives 0.12mL of the RED vial (1/100). Immunotherapy script #2 contains  ragweed, molds, dust mites, and cockroach. He currently receives 0.79mL of the RED vial (1/100). He started shots September of 2024 and reached maintenance in March of 2025.   Food Allergy Symptom History: ***  Skin Symptom History: ***  GERD Symptom  History: ***  Infection Symptom History: ***  Otherwise, there have been no changes to his past medical history, surgical history, family history, or social history.    Review of systems otherwise negative other than that mentioned in the HPI.    Objective:   Blood pressure 132/76, pulse 98, temperature (!) 97.3 F (36.3 C), temperature source Temporal, resp. rate 12, SpO2 97%. There is no height or weight on file to calculate BMI.    Physical Exam Vitals reviewed.  Constitutional:      Appearance: He is well-developed.     Comments: Gregarious.  Very friendly.  HENT:     Head: Normocephalic and atraumatic.     Right Ear: Tympanic membrane, ear canal and external ear normal. No drainage, swelling or tenderness. Tympanic membrane is not injected, scarred, erythematous, retracted or bulging.     Left Ear: Tympanic membrane, ear canal and external ear normal. No drainage, swelling or tenderness. Tympanic membrane is not injected, scarred, erythematous, retracted or bulging.     Nose: No nasal deformity, septal deviation, mucosal edema or rhinorrhea.     Right Turbinates: Enlarged, swollen and pale.     Left Turbinates: Enlarged, swollen and pale.  Right Sinus: No maxillary sinus tenderness or frontal sinus tenderness.     Left Sinus: No maxillary sinus tenderness or frontal sinus tenderness.     Mouth/Throat:     Lips: Pink.     Mouth: Mucous membranes are moist. Mucous membranes are not pale and not dry.     Pharynx: Uvula midline.     Comments: No cobblestoning.  Eyes:     General:        Right eye: No discharge.        Left eye: No discharge.     Conjunctiva/sclera: Conjunctivae normal.     Right eye: Right conjunctiva is not injected. No chemosis.    Left eye: Left conjunctiva is not injected. No chemosis.    Pupils: Pupils are equal, round, and reactive to light.  Cardiovascular:     Rate and Rhythm: Normal rate and regular rhythm.     Heart sounds: Normal heart  sounds.  Pulmonary:     Effort: Pulmonary effort is normal. No tachypnea, accessory muscle usage or respiratory distress.     Breath sounds: Normal breath sounds. No wheezing, rhonchi or rales.     Comments: Moving air well in all lung fields.  No increased work of breathing. No crackles or wheezes noted.  Chest:     Chest wall: No tenderness.  Abdominal:     Tenderness: There is no abdominal tenderness. There is no guarding or rebound.  Lymphadenopathy:     Head:     Right side of head: No submandibular, tonsillar or occipital adenopathy.     Left side of head: No submandibular, tonsillar or occipital adenopathy.     Cervical: No cervical adenopathy.  Skin:    General: Skin is warm.     Capillary Refill: Capillary refill takes less than 2 seconds.     Coloration: Skin is not pale.     Findings: No abrasion, erythema, petechiae or rash. Rash is not papular, urticarial or vesicular.     Comments: No eczematous or urticarial lesions noted.  Neurological:     Mental Status: He is alert.  Psychiatric:        Behavior: Behavior is cooperative.      Diagnostic studies:    Spirometry: results normal (FEV1: 2.29/91%, FVC: 2.98/92%, FEV1/FVC: 77%).    Spirometry consistent with normal pattern.   Allergy Studies: none       Malachi Bonds, MD  [facility]

## 2023-08-19 ENCOUNTER — Encounter: Payer: Self-pay | Admitting: Allergy & Immunology

## 2023-08-23 ENCOUNTER — Ambulatory Visit: Admitting: Psychology

## 2023-08-26 ENCOUNTER — Ambulatory Visit (INDEPENDENT_AMBULATORY_CARE_PROVIDER_SITE_OTHER)

## 2023-08-26 DIAGNOSIS — J309 Allergic rhinitis, unspecified: Secondary | ICD-10-CM | POA: Diagnosis not present

## 2023-08-31 ENCOUNTER — Ambulatory Visit (INDEPENDENT_AMBULATORY_CARE_PROVIDER_SITE_OTHER)

## 2023-08-31 DIAGNOSIS — J309 Allergic rhinitis, unspecified: Secondary | ICD-10-CM | POA: Diagnosis not present

## 2023-09-02 ENCOUNTER — Encounter: Payer: Self-pay | Admitting: Physical Medicine and Rehabilitation

## 2023-09-06 ENCOUNTER — Ambulatory Visit (INDEPENDENT_AMBULATORY_CARE_PROVIDER_SITE_OTHER): Admitting: Psychology

## 2023-09-06 DIAGNOSIS — F4310 Post-traumatic stress disorder, unspecified: Secondary | ICD-10-CM

## 2023-09-06 DIAGNOSIS — F32A Depression, unspecified: Secondary | ICD-10-CM | POA: Diagnosis not present

## 2023-09-06 NOTE — Progress Notes (Signed)
 [facility] Counselor/Therapist Progress Note  Patient ID: Austin Faulkner, MRN: [id number]    Date: 09/06/23  Time Spent: 10:00 am - 10:55 am : 55 minutes  Treatment Type: Individual Therapy.  Reported Symptoms:  feeling nervous and on edge, not being able to control worrying, trouble relaxing, hard to sit still, sleep trouble, poor appetite, being so fidgety and restless trouble sitting still   Mental Status Exam: Appearance:  Well Groomed     Behavior: Appropriate  Motor: Normal  Speech/Language:  Normal Rate  Affect: Appropriate                         Mood  WNL  Thought process: normal  Thought content:   WNL  Sensory/Perceptual disturbances:   WNL  Orientation: oriented to person, place, and time/date  Attention: Good  Concentration: Good  Memory: WNL  Fund of knowledge:  Good  Insight:   Good  Judgment:  Good  Impulse Control: Good   Risk Assessment: Danger to Self:  No Self-injurious Behavior: No Danger to Others: No Duty to Warn:no Physical Aggression / Violence:No  Access to Firearms a concern: No  Gang Involvement:No   Subjective:   Austin Faulkner participated in the session, in person in the office with the therapist, and consented to treatment. Austin Faulkner reviewed the events of the past week, and we reviewed his treatment goals. Discussed specific steps for each goal.    1. To be outside and structure his time  2. Let everyone else know his schedule  3. Set limits with wife, ex: not engaging with her  4. Taking some time away, time away, about 7 days  5. Continue to mentor his grandson, Austin Faulkner   Interventions: Cognitive Behavioral Therapy  Diagnosis:   Depression, unspecified depression type 32.A Post traumatic stress disorder (PTSD) 43.1  [facility]  Treatment Plan:  Client Abilities/Strengths Austin Faulkner is very articulate, expressive, self-aware, and motivated for change   Support System: Spouse,  God, handful of friends   Client Treatment Preferences OPT  Client Statement of Needs Austin Faulkner would like to structure his time and let everyone else know his schedule, set limits with his wife to help both of them, and take some time away so he can "fill back up".    Treatment Level Weekly/Biweekly  Symptoms  Anxiety: feeling nervous and on edge, not being able to control worrying, trouble relaxing, hard to sit still, sleep trouble, poor appetite, being so fidgety and restless trouble sitting still. (Status: maintained)   Goals:   Austin Faulkner experiences symptoms of depression and PTSD.   Target Date: 07/24/24 Frequency: Weekly/Biweekly  Progress: 0 Modality: individual    Therapist will provide referrals for additional resources as appropriate.  Therapist will provide psycho-education regarding Austin Faulkner diagnosis and corresponding treatment approaches and interventions. Austin Faulkner will support the patient's ability to achieve the goals identified. will employ CBT, BA, Problem-solving, Solution Focused, Mindfulness,  coping skills, & other evidenced-based practices will be used to promote progress towards healthy functioning to help manage decrease symptoms associated with their diagnosis.   Reduce overall level, frequency, and intensity of the feelings of depression, anxiety and panic evidenced by decreased overall symptoms from 6 to 7 days/week to 0 to 1 days/week per client report for at least 3 consecutive months. Verbally express understanding of the relationship between feelings of depression and anxiety and their impact on thinking patterns and behaviors. Verbalize an understanding of  the role that distorted thinking plays in creating fears, excessive worry, and ruminations.  Emeterio Hansen participated in the creation of the treatment plan)    Austin Faulkner

## 2023-09-08 ENCOUNTER — Ambulatory Visit (INDEPENDENT_AMBULATORY_CARE_PROVIDER_SITE_OTHER)

## 2023-09-08 DIAGNOSIS — J309 Allergic rhinitis, unspecified: Secondary | ICD-10-CM | POA: Diagnosis not present

## 2023-09-16 ENCOUNTER — Ambulatory Visit: Payer: Self-pay

## 2023-09-20 ENCOUNTER — Ambulatory Visit (INDEPENDENT_AMBULATORY_CARE_PROVIDER_SITE_OTHER)

## 2023-09-20 DIAGNOSIS — J309 Allergic rhinitis, unspecified: Secondary | ICD-10-CM

## 2023-09-30 ENCOUNTER — Ambulatory Visit (INDEPENDENT_AMBULATORY_CARE_PROVIDER_SITE_OTHER): Admitting: Psychology

## 2023-09-30 DIAGNOSIS — F32A Depression, unspecified: Secondary | ICD-10-CM

## 2023-09-30 DIAGNOSIS — F4310 Post-traumatic stress disorder, unspecified: Secondary | ICD-10-CM | POA: Diagnosis not present

## 2023-09-30 NOTE — Progress Notes (Unsigned)
 [facility] Counselor/Therapist Progress Note  Patient ID: Austin Faulkner, MRN: [id number]    Date: 09/30/23  Time Spent: 10:00 am  -  10:45 am :  45 minutes  Treatment Type: Individual Therapy.  Reported Symptoms: Patient continues to have these symptoms: feeling nervous and on edge, not being able to control worrying, trouble relaxing, hard to sit still, sleep trouble, poor appetite, being so fidgety and restless trouble sitting still.   Mental Status Exam: Appearance:  Casual     Behavior: Appropriate                        Motor:   Speech/Language:  Normal Rate  Affect: Appropriate  Mood: normal  Thought process: normal  Thought content:   WNL  Sensory/Perceptual disturbances:   WNL  Orientation: oriented to person, place, and time/date  Attention: Good  Concentration: Good  Memory: WNL  Fund of knowledge:  Good  Insight:   Good  Judgment:  Good  Impulse Control: Good   Risk Assessment: Danger to Self:  No Self-injurious Behavior: No Danger to Others: No Duty to Warn:no Physical Aggression / Violence:No  Access to Firearms a concern: No  Gang Involvement:No   Subjective:   Wannetta Gutting participated in the session, in person in the office with the therapist, and consented to treatment. Arth reviewed the events of the past week.   Patient described some panic attacks that he had since our last session. 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   Interventions: Cognitive Behavioral Therapy  Diagnosis:    Depression, unspecified depression type 32.A Post traumatic stress disorder (PTSD) 43.1  [facility]  Treatment Plan:  Client Abilities/Strengths Layn is very articulate, expressive, self-aware, and motivated for chang   Support System: Spouse, God, handful of friends  Client Treatment Preferences OPT  Client Statement of  Needs Braylen would like to structure his time and let everyone else know his schedule, set limits with his wife to help both of them, and take some time away so he can "fill back up".     Treatment Level Every three weeks  Symptoms  Patient continues to have these symptoms: feeling nervous and on edge, not being able to control worrying, trouble relaxing, hard to sit still, sleep trouble, poor appetite, being so fidgety and restless trouble sitting still. (Status: maintained)  Goals:  We reviewed numerous treatment approaches including CBT, BA, Problem Solving, and Solution focused therapy. Psych-education regarding the Seif's diagnosis of Depression, unspecified depression type 32.A, and Post traumatic stress disorder (PTSD) 43.1was provided during the session.   Zuhayr experiences symptoms of depression and PTSD.   Target Date: 07/24/24 Frequency: every three weeks  Progress: 0 Modality: individual    Therapist will provide referrals for additional resources as appropriate.  Therapist will provide psycho-education regarding Bryer's diagnosis and corresponding treatment approaches and interventions. Fran Imus will support the patient's ability to achieve the goals identified. will employ CBT, BA, Problem-solving, Solution Focused, Mindfulness,  coping skills, & other evidenced-based practices will be used to promote progress towards healthy functioning to help manage decrease symptoms associated with their diagnosis.   Reduce overall level, frequency, and intensity of the feelings of depression, anxiety and panic evidenced by decreased overall symptoms from 6 to 7 days/week to 0 to 1 days/week per client report for at least  3 consecutive months. Verbally express understanding of the relationship between feelings of depression and their impact on thinking patterns and behaviors. Verbalize an understanding of the role that distorted thinking plays in creating fears, excessive  worry, and ruminations.  Emeterio Hansen participated in the creation of the treatment plan)    Fran Imus

## 2023-10-10 DIAGNOSIS — J3081 Allergic rhinitis due to animal (cat) (dog) hair and dander: Secondary | ICD-10-CM | POA: Diagnosis not present

## 2023-10-10 NOTE — Progress Notes (Signed)
 VIALS MADE 10-10-23

## 2023-10-11 DIAGNOSIS — J3089 Other allergic rhinitis: Secondary | ICD-10-CM | POA: Diagnosis not present

## 2023-10-18 ENCOUNTER — Ambulatory Visit (INDEPENDENT_AMBULATORY_CARE_PROVIDER_SITE_OTHER)

## 2023-10-18 ENCOUNTER — Ambulatory Visit: Admitting: Psychology

## 2023-10-18 DIAGNOSIS — J309 Allergic rhinitis, unspecified: Secondary | ICD-10-CM | POA: Diagnosis not present

## 2023-11-07 ENCOUNTER — Encounter: Payer: Self-pay | Admitting: Physical Medicine and Rehabilitation

## 2023-11-07 ENCOUNTER — Encounter: Attending: Physical Medicine and Rehabilitation | Admitting: Physical Medicine and Rehabilitation

## 2023-11-07 VITALS — BP 128/84 | HR 67 | Ht 69.0 in | Wt 170.0 lb

## 2023-11-07 DIAGNOSIS — G5602 Carpal tunnel syndrome, left upper limb: Secondary | ICD-10-CM | POA: Insufficient documentation

## 2023-11-08 ENCOUNTER — Encounter: Admitting: Psychology

## 2023-11-08 NOTE — Progress Notes (Signed)
                Austin Faulkner This encounter was created in error - please disregard.

## 2023-11-14 ENCOUNTER — Encounter: Payer: Self-pay | Admitting: Physical Medicine and Rehabilitation

## 2023-11-14 DIAGNOSIS — G5602 Carpal tunnel syndrome, left upper limb: Secondary | ICD-10-CM | POA: Insufficient documentation

## 2023-11-14 NOTE — Progress Notes (Signed)
 See procedure tab for results scan

## 2023-11-15 ENCOUNTER — Ambulatory Visit (INDEPENDENT_AMBULATORY_CARE_PROVIDER_SITE_OTHER)

## 2023-11-15 ENCOUNTER — Ambulatory Visit (INDEPENDENT_AMBULATORY_CARE_PROVIDER_SITE_OTHER): Payer: No Typology Code available for payment source | Admitting: Family

## 2023-11-15 VITALS — BP 117/64 | HR 51 | Temp 97.6°F | Resp 16 | Ht 69.0 in | Wt 169.0 lb

## 2023-11-15 DIAGNOSIS — F4310 Post-traumatic stress disorder, unspecified: Secondary | ICD-10-CM

## 2023-11-15 DIAGNOSIS — I10 Essential (primary) hypertension: Secondary | ICD-10-CM | POA: Diagnosis not present

## 2023-11-15 DIAGNOSIS — F32A Depression, unspecified: Secondary | ICD-10-CM | POA: Diagnosis not present

## 2023-11-15 DIAGNOSIS — J309 Allergic rhinitis, unspecified: Secondary | ICD-10-CM

## 2023-11-15 DIAGNOSIS — G5601 Carpal tunnel syndrome, right upper limb: Secondary | ICD-10-CM | POA: Insufficient documentation

## 2023-11-15 DIAGNOSIS — G4733 Obstructive sleep apnea (adult) (pediatric): Secondary | ICD-10-CM

## 2023-11-15 DIAGNOSIS — J4540 Moderate persistent asthma, uncomplicated: Secondary | ICD-10-CM

## 2023-11-15 DIAGNOSIS — D649 Anemia, unspecified: Secondary | ICD-10-CM | POA: Diagnosis not present

## 2023-11-15 DIAGNOSIS — R011 Cardiac murmur, unspecified: Secondary | ICD-10-CM

## 2023-11-15 DIAGNOSIS — K219 Gastro-esophageal reflux disease without esophagitis: Secondary | ICD-10-CM

## 2023-11-15 DIAGNOSIS — M0609 Rheumatoid arthritis without rheumatoid factor, multiple sites: Secondary | ICD-10-CM | POA: Diagnosis not present

## 2023-11-15 LAB — CBC WITH DIFFERENTIAL/PLATELET
Absolute Lymphocytes: 2668 {cells}/uL (ref 850–3900)
Absolute Monocytes: 510 {cells}/uL (ref 200–950)
Basophils Absolute: 52 {cells}/uL (ref 0–200)
Basophils Relative: 1 %
Eosinophils Absolute: 151 {cells}/uL (ref 15–500)
Eosinophils Relative: 2.9 %
HCT: 40.9 % (ref 38.5–50.0)
Hemoglobin: 13.3 g/dL (ref 13.2–17.1)
MCH: 30.7 pg (ref 27.0–33.0)
MCHC: 32.5 g/dL (ref 32.0–36.0)
MCV: 94.5 fL (ref 80.0–100.0)
MPV: 9.7 fL (ref 7.5–12.5)
Monocytes Relative: 9.8 %
Neutro Abs: 1820 {cells}/uL (ref 1500–7800)
Neutrophils Relative %: 35 %
Platelets: 276 10*3/uL (ref 140–400)
RBC: 4.33 10*6/uL (ref 4.20–5.80)
RDW: 11.9 % (ref 11.0–15.0)
Total Lymphocyte: 51.3 %
WBC: 5.2 10*3/uL (ref 3.8–10.8)

## 2023-11-15 LAB — IBC PANEL
Iron: 155 ug/dL (ref 42–165)
Saturation Ratios: 48.6 % (ref 20.0–50.0)
TIBC: 319.2 ug/dL (ref 250.0–450.0)
Transferrin: 228 mg/dL (ref 212.0–360.0)

## 2023-11-15 LAB — BASIC METABOLIC PANEL WITH GFR
BUN: 18 mg/dL (ref 6–23)
CO2: 32 meq/L (ref 19–32)
Calcium: 9.1 mg/dL (ref 8.4–10.5)
Chloride: 103 meq/L (ref 96–112)
Creatinine, Ser: 1.01 mg/dL (ref 0.40–1.50)
GFR: 77.98 mL/min (ref >60.00)
Glucose, Bld: 88 mg/dL (ref 70–99)
Potassium: 3.8 meq/L (ref 3.5–5.1)
Sodium: 140 meq/L (ref 135–145)

## 2023-11-15 MED ORDER — HADLIMA 40 MG/0.4ML ~~LOC~~ SOSY: 40.0000 mg | PREFILLED_SYRINGE | SUBCUTANEOUS | Status: AC

## 2023-11-15 MED ORDER — PAROXETINE HCL 20 MG PO TABS: 20.0000 mg | ORAL_TABLET | Freq: Every day | ORAL | Status: AC

## 2023-11-15 NOTE — Assessment & Plan Note (Signed)
 Echo noted following: Trivial mitral valve regurgitation. No evidence of mitral stenosis. 4. Aortic valve regurgitation is trivial. No aortic stenosis is present. 5. Mild tricuspid regurgtation.

## 2023-11-15 NOTE — Assessment & Plan Note (Addendum)
 Well controlled on advair improved with allergy shots.  Continues zyrtec  and singulair  which are helpful

## 2023-11-15 NOTE — Assessment & Plan Note (Signed)
 Stable, spending time at home.

## 2023-11-15 NOTE — Assessment & Plan Note (Signed)
 Lab Results  Component Value Date   PSA 3.10 05/16/2023

## 2023-11-15 NOTE — Patient Instructions (Signed)
 VISIT SUMMARY:  You came in for a preoperative evaluation for your upcoming carpal tunnel release surgery. We discussed your rheumatoid arthritis, PTSD, sleep apnea, and asthma management. We also reviewed your general health maintenance and scheduled follow-up care.  YOUR PLAN:  CARPAL TUNNEL SYNDROME: You have severe numbness and tingling in your fingers and arm, which is affecting your sleep. An MRI was unremarkable, but an electromyography confirmed carpal tunnel syndrome. -Proceed with carpal tunnel release surgery. -Await scheduling confirmation.  RHEUMATOID ARTHRITIS: Your rheumatoid arthritis is affecting your left hand and has worsened since stopping certain medications. You are currently on injections every 14 days. -Continue injections every 14 days.  POST-TRAUMATIC STRESS DISORDER (PTSD): You have increased irritability and sleep disturbances. Your doses of Paxil and trazodone  have been increased. -Increase Paxil to 20 mg. -Increase trazodone  dosage.  OBSTRUCTIVE SLEEP APNEA: You are having difficulty using your CPAP machine due to arm pain and sleep disturbances. We hope that post-surgical relief from carpal tunnel syndrome will improve your CPAP compliance. -Anticipated post-surgical relief from carpal tunnel syndrome to improve CPAP compliance.  ASTHMA: Your asthma is well-controlled with monthly allergy shots and Singulair . You have minimal use of your albuterol  inhaler. -Continue allergy shots once a month. -Continue Singulair .  GENERAL HEALTH MAINTENANCE: You are scheduled for vaccinations post-surgery and are taking supplements to maintain your health. -Administer shingles, pneumonia, and booster shots post-surgery. -Check iron levels.  FOLLOW-UP: We will check back in six months to review your progress. -Schedule follow-up appointment in six months.

## 2023-11-15 NOTE — Assessment & Plan Note (Signed)
 Paxil recently increase by his psychiatrist at the [facility].

## 2023-11-15 NOTE — Assessment & Plan Note (Signed)
 Now on injectable appetite coming back.

## 2023-11-15 NOTE — Progress Notes (Signed)
 Subjective:     Patient ID: Austin Faulkner, male    DOB: 01-04-1956, 68 y.o.   MRN: [id number]  Chief Complaint  Patient presents with   Hypertension    Here for follow up    Hypertension    Discussed the use of AI scribe software for clinical note transcription with the patient, who gave verbal consent to proceed.  History of Present Illness  Austin Faulkner is a 67 year old male with rheumatoid arthritis and carpal tunnel syndrome who presents for preoperative evaluation for carpal tunnel release surgery.  He experiences numbness in his fingers and entire arm, severe enough to disrupt his sleep, limiting it to about an hour and a half per night. He shakes his arm to relieve the tingling sensation. An MRI was unremarkable, and an electrical study was performed to evaluate his symptoms. He is scheduled for carpal tunnel release surgery.  He recently stopped taking Plaquenil , methotrexate, leflunomide, and folic acid due to side effects, including significant weight loss and loss of appetite. His appetite has improved since discontinuing these medications. He is currently on Hadlima injections every fourteen days and is about to take his third dose. The pain from rheumatoid arthritis is worsening, particularly in his left hand.  He experiences sleep disturbances, which he attributes to the numbness in his arm. He has been prescribed gabapentin  for nerve pain, which makes him very sleepy. He is also taking Paxil and trazodone , both of which have been increased in dosage recently to help with his PTSD and sleep issues.  His asthma is well-controlled, and he has reduced his allergy shots to once a month. He takes Zyrtec  twice daily and Singulair , and has not needed to use his albuterol  inhaler frequently.  He has not been using his CPAP machine regularly due to discomfort from his arm pain, which has significantly reduced his usage.  He has a history of PTSD and is under the care of a  psychologist, with whom he has been working for eight years. He is concerned about the stability of his healthcare providers at the [facility] due to staffing changes.    Health Maintenance Due  Topic Date Due   Zoster Vaccines- Shingrix (1 of 2) Never done   Pneumococcal Vaccine: 50+ Years (2 of 2 - PCV) 01/08/2021   COVID-19 Vaccine (8 - 2024-25 season) 08/22/2023    Past Medical History:  Diagnosis Date   Asthma    COPD (chronic obstructive pulmonary disease) (HCC)    Depression    PTSD   Diverticulitis    Diverticulitis of colon with perforation 03/04/2016   Dyspnea    secondary to seasonal allergies; or illness   GERD (gastroesophageal reflux disease)    Heart murmur    slight since birth   Hypertension    Pneumonia yrs ago   Rheumatoid arthritis (HCC)    Seasonal allergies    Shoulder dislocation     Past Surgical History:  Procedure Laterality Date   INGUINAL HERNIA REPAIR Bilateral 07/23/2021   Procedure: LAPAROSCOPIC BILATERAL INGUINAL HERNIA REPAIR WITH MESH;  Surgeon: Kinsinger, Herlene Righter, MD;  Location: [facility];  Service: General;  Laterality: Bilateral;   knee arthroscopy and cartlidge repair Bilateral    5 on right and 5 on left   NECK SURGERY  2022   A,C,D,F C4/5   SHOULDER ARTHROSCOPY Left 2008   SMALL INTESTINE SURGERY  2017   small intestine resection   TOTAL KNEE ARTHROPLASTY Bilateral 2002  2010    Family History  Problem Relation Age of Onset   Lung cancer Mother    Heart attack Father 38       healthier than I am   Lupus Sister    Multiple myeloma Sister        BMT at [facility]   Stroke Paternal Grandmother    Alpha-1 antitrypsin deficiency Neg Hx     Social History   Socioeconomic History   Marital status: Married    Spouse name: Tilton   Number of children: 3   Years of education: Not on file   Highest education level: Never attended school  Occupational History   Occupation: retired  Tobacco Use   Smoking status: Never    Passive  exposure: Yes   Smokeless tobacco: Never  Vaping Use   Vaping status: Never Used  Substance and Sexual Activity   Alcohol use: No   Drug use: No   Sexual activity: Yes    Partners: Female  Other Topics Concern   Not on file  Social History Narrative   Not on file   Social Drivers of Health   Financial Resource Strain: Low Risk  (11/15/2023)   Overall Financial Resource Strain (CARDIA)    Difficulty of Paying Living Expenses: Not hard at all  Food Insecurity: No Food Insecurity (11/15/2023)   Hunger Vital Sign    Worried About Running Out of Food in the Last Year: Never true    Ran Out of Food in the Last Year: Never true  Transportation Needs: No Transportation Needs (11/15/2023)   PRAPARE - Administrator, Civil Service (Medical): No    Lack of Transportation (Non-Medical): No  Physical Activity: Unknown (11/15/2023)   Exercise Vital Sign    Days of Exercise per Week: 6 days    Minutes of Exercise per Session: Not on file  Stress: Stress Concern Present (11/15/2023)   [employer] of Occupational Health - Occupational Stress Questionnaire    Feeling of Stress: To some extent  Social Connections: Socially Integrated (11/15/2023)   Social Connection and Isolation Panel    Frequency of Communication with Friends and Family: More than three times a week    Frequency of Social Gatherings with Friends and Family: More than three times a week    Attends Religious Services: More than 4 times per year    Active Member of [employer] or Organizations: Yes    Attends Banker Meetings: Not on file    Marital Status: Married  Intimate Partner Violence: Not At Risk (09/14/2021)   Humiliation, Afraid, Rape, and Kick questionnaire    Fear of Current or Ex-Partner: No    Emotionally Abused: No    Physically Abused: No    Sexually Abused: No    Outpatient Medications Prior to Visit  Medication Sig Dispense Refill   albuterol  (VENTOLIN  HFA) 108 (90 Base) MCG/ACT  inhaler Inhale 2 puffs into the lungs every 4 (four) hours as needed for wheezing or shortness of breath. 18 g 1   cetirizine  (ZYRTEC ) 10 MG tablet Take 1 tablet (10 mg total) by mouth daily as needed for allergies (Can take an extra dose during flare ups.). 180 tablet 1   EPINEPHrine  0.3 mg/0.3 mL IJ SOAJ injection Inject 0.3 mg into the muscle as needed for anaphylaxis.     ferrous gluconate (FERGON) 324 MG tablet Take 324 mg by mouth at bedtime.     fluticasone -salmeterol (ADVAIR HFA) 115-21 MCG/ACT inhaler Inhale 2  puffs into the lungs 2 (two) times daily. 36 g 1   hydrochlorothiazide  (HYDRODIURIL ) 25 MG tablet Take 25 mg by mouth daily.     ipratropium-albuterol  (DUONEB) 0.5-2.5 (3) MG/3ML SOLN Take 3 mLs by nebulization every 6 (six) hours as needed. 360 mL    ketotifen (ZADITOR) 0.035 % ophthalmic solution Place 1 drop into both eyes 2 (two) times daily.     montelukast  (SINGULAIR ) 10 MG tablet Take 10 mg by mouth at bedtime.     Multiple Vitamin (MULTIVITAMIN WITH MINERALS) TABS tablet Take 1 tablet by mouth daily.     omeprazole (PRILOSEC) 20 MG capsule Take 20 mg by mouth daily before breakfast.     PRESCRIPTION MEDICATION 2 (two) times a week. Allergy injection     Probiotic Product (PROBIOTIC DAILY PO) Take 1 capsule by mouth daily before breakfast. 100 billion     Spacer/Aero-Holding Chambers DEVI 1 Device by Does not apply route as directed. 1 each 1   tadalafil (CIALIS) 20 MG tablet Take 20 mg by mouth daily as needed for erectile dysfunction.     tamsulosin  (FLOMAX ) 0.4 MG CAPS capsule Take 0.4 mg by mouth at bedtime.     Tiotropium Bromide Monohydrate  (SPIRIVA  RESPIMAT) 1.25 MCG/ACT AERS Inhale 2 puffs into the lungs 2 (two) times daily.     traZODone  (DESYREL ) 100 MG tablet Take 100 mg by mouth at bedtime.     TURMERIC CURCUMIN PO Take 900 mg by mouth daily before breakfast.     Adalimumab-bwwd (HADLIMA) 40 MG/0.4ML SOSY Inject into the skin.     leflunomide (ARAVA) 20 MG tablet  Take 20 mg by mouth at bedtime.     hydroxychloroquine  (PLAQUENIL ) 200 MG tablet Take 200 mg by mouth 2 (two) times daily.     meloxicam  (MOBIC ) 15 MG tablet Take 15 mg by mouth daily.     methotrexate (RHEUMATREX) 2.5 MG tablet TAKE THREE TABLETS BY MOUTH EVERY 7 DAYS RHEUMATOID ARTHRITIS     sulfaSALAzine  (AZULFIDINE ) 500 MG tablet Take 500 mg by mouth 2 (two) times daily.     No facility-administered medications prior to visit.    Allergies  Allergen Reactions   Azithromycin Other (See Comments)    Patient reports does not help with infections.    Sildenafil Other (See Comments)    Other reaction(s): Headache, Visual disturbance, Other (See Comments)  Viagra, doesn't help  Other reaction(s): Headache, Pain in eye  Other Reaction(s): Other (See Comments)   Tape Rash    Must use paper tape  Prefers paper tape, others bruise skin    ROS See HPI    Objective:    Physical Exam Constitutional:      General: He is not in acute distress.    Appearance: He is well-developed.  HENT:     Head: Normocephalic and atraumatic.   Cardiovascular:     Rate and Rhythm: Normal rate and regular rhythm.     Heart sounds: Murmur heard.  Pulmonary:     Effort: Pulmonary effort is normal. No respiratory distress.     Breath sounds: Normal breath sounds. No wheezing or rales.   Skin:    General: Skin is warm and dry.   Neurological:     Mental Status: He is alert and oriented to person, place, and time.   Psychiatric:        Behavior: Behavior normal.        Thought Content: Thought content normal.      BP 117/64 (BP  Location: Right Arm, Patient Position: Sitting, Cuff Size: Small)   Pulse (!) 51   Temp 97.6 F (36.4 C) (Oral)   Resp 16   Ht 5' 9 (1.753 m)   Wt 169 lb (76.7 kg)   SpO2 100%   BMI 24.96 kg/m  Wt Readings from Last 3 Encounters:  11/15/23 169 lb (76.7 kg)  11/07/23 170 lb (77.1 kg)  06/29/23 181 lb (82.1 kg)       Assessment & Plan:   Problem  List Items Addressed This Visit       Unprioritized   Rheumatoid arthritis of multiple sites without rheumatoid factor (HCC)   Now on injectable appetite coming back.        Relevant Medications   Adalimumab-bwwd (HADLIMA) 40 MG/0.4ML SOSY   PTSD (post-traumatic stress disorder)   Stable, spending time at home.       Relevant Medications   PARoxetine (PAXIL) 20 MG tablet   OSA (obstructive sleep apnea)   Struggling due to right arm pain.  Should be more compliant after his scheduled surgery.       Murmur   Echo noted following: Trivial mitral valve regurgitation. No evidence of mitral stenosis. 4. Aortic valve regurgitation is trivial. No aortic stenosis is present. 5. Mild tricuspid regurgtation.      Moderate persistent asthma, uncomplicated   Well controlled on advair improved with allergy shots.  Continues zyrtec  and singulair  which are helpful       GERD (gastroesophageal reflux disease)   Stable on omeprazole.      Essential hypertension - Primary   BP Readings from Last 3 Encounters:  11/15/23 117/64  11/07/23 128/84  08/18/23 132/76   BP looks great on hydrochlorothiazide .      Relevant Orders   Basic Metabolic Panel (BMET)   Depression   Paxil recently increase by his psychiatrist at the [facility].       Relevant Medications   PARoxetine (PAXIL) 20 MG tablet   Carpal tunnel syndrome, right   New.  Scheduled for carpal tunnel release surgery with Dr. Colon.      Relevant Medications   PARoxetine (PAXIL) 20 MG tablet   Anemia   Lab Results  Component Value Date   WBC 4.8 05/16/2023   HGB 13.7 05/16/2023   HCT 41.3 05/16/2023   MCV 100.7 (H) 05/16/2023   PLT 319.0 05/16/2023   Stable on iron supplement, MVI with minerals.       Relevant Orders   CBC with Differential/Platelet   IBC panel    I have discontinued Sarp S. Goldammer's hydroxychloroquine , sulfaSALAzine , leflunomide, meloxicam , methotrexate, and Hadlima. I am also having him start on  Hadlima and PARoxetine. Additionally, I am having him maintain his omeprazole, TURMERIC CURCUMIN PO, multivitamin with minerals, tamsulosin , traZODone , tadalafil, Spiriva  Respimat, ipratropium-albuterol , EPINEPHrine , hydrochlorothiazide , PRESCRIPTION MEDICATION, Probiotic Product (PROBIOTIC DAILY PO), ferrous gluconate, ketotifen, montelukast , fluticasone -salmeterol, albuterol , cetirizine , and Spacer/Aero-Holding Chambers.  Meds ordered this encounter  Medications   Adalimumab-bwwd (HADLIMA) 40 MG/0.4ML SOSY    Sig: Inject 40 mg into the skin every 14 (fourteen) days.    Supervising Provider:   DOMENICA BLACKBIRD A [[id number]]   PARoxetine (PAXIL) 20 MG tablet    Sig: Take 1 tablet (20 mg total) by mouth daily.    Supervising Provider:   DOMENICA BLACKBIRD A [phone]

## 2023-11-15 NOTE — Assessment & Plan Note (Signed)
 Stable on omeprazole.

## 2023-11-15 NOTE — Assessment & Plan Note (Signed)
 Struggling due to right arm pain.  Should be more compliant after his scheduled surgery.

## 2023-11-15 NOTE — Assessment & Plan Note (Signed)
 BP Readings from Last 3 Encounters:  11/15/23 117/64  11/07/23 128/84  08/18/23 132/76   BP looks great on hydrochlorothiazide .

## 2023-11-15 NOTE — Assessment & Plan Note (Signed)
 Lab Results  Component Value Date   WBC 4.8 05/16/2023   HGB 13.7 05/16/2023   HCT 41.3 05/16/2023   MCV 100.7 (H) 05/16/2023   PLT 319.0 05/16/2023   Stable on iron supplement, MVI with minerals.

## 2023-11-15 NOTE — Assessment & Plan Note (Signed)
 New.  Scheduled for carpal tunnel release surgery with Dr. Colon.

## 2023-11-16 ENCOUNTER — Ambulatory Visit: Payer: Self-pay | Admitting: Family

## 2023-11-22 ENCOUNTER — Ambulatory Visit: Admitting: Psychology

## 2023-11-29 ENCOUNTER — Encounter: Admitting: Psychology

## 2023-11-29 NOTE — Progress Notes (Signed)
 This encounter was created in error - please disregard.

## 2023-12-20 ENCOUNTER — Ambulatory Visit: Admitting: Psychology

## 2023-12-20 NOTE — Progress Notes (Unsigned)
                Austin Faulkner

## 2023-12-22 ENCOUNTER — Ambulatory Visit: Payer: Self-pay

## 2023-12-22 ENCOUNTER — Ambulatory Visit (INDEPENDENT_AMBULATORY_CARE_PROVIDER_SITE_OTHER)

## 2023-12-22 DIAGNOSIS — J309 Allergic rhinitis, unspecified: Secondary | ICD-10-CM

## 2023-12-28 ENCOUNTER — Ambulatory Visit (INDEPENDENT_AMBULATORY_CARE_PROVIDER_SITE_OTHER): Admitting: Internal Medicine

## 2023-12-28 ENCOUNTER — Encounter: Payer: Self-pay | Admitting: Internal Medicine

## 2023-12-28 VITALS — BP 132/88 | HR 56 | Temp 97.8°F | Ht 70.0 in | Wt 168.0 lb

## 2023-12-28 DIAGNOSIS — R0602 Shortness of breath: Secondary | ICD-10-CM

## 2023-12-28 DIAGNOSIS — J4530 Mild persistent asthma, uncomplicated: Secondary | ICD-10-CM

## 2023-12-28 DIAGNOSIS — K219 Gastro-esophageal reflux disease without esophagitis: Secondary | ICD-10-CM | POA: Diagnosis not present

## 2023-12-28 DIAGNOSIS — M06041 Rheumatoid arthritis without rheumatoid factor, right hand: Secondary | ICD-10-CM | POA: Diagnosis not present

## 2023-12-28 DIAGNOSIS — Z9109 Other allergy status, other than to drugs and biological substances: Secondary | ICD-10-CM

## 2023-12-28 DIAGNOSIS — M06042 Rheumatoid arthritis without rheumatoid factor, left hand: Secondary | ICD-10-CM

## 2023-12-28 NOTE — Patient Instructions (Addendum)
 It was a pleasure to see you today!  Please schedule follow up with myself in 1 year.  If my schedule is not open yet, we will contact you with a reminder closer to that time. Please call [phone] if you haven't heard from us  a month before, and always call us  sooner if issues or concerns arise. You can also send us  a message through MyChart, but but aware that this is not to be used for urgent issues and it may take up to 5-7 days to receive a reply. Please be aware that you will likely be able to view your results before I have a chance to respond to them. Please give us  5 business days to respond to any non-urgent results.    Before your next visit I would like you to have: CT Chest  Your breathing testing showed normal exhalation phase of your breathing but some difficulty taking a deep breath then.  I am glad you are feeling well, however because of your rheumatoid arthritis I like to follow this up with a CT scan of your chest.  It was completely normal in 2023 so I am hoping it will be normal again today.  It is better to be safe than sorry because rheumatoid arthritis can also affect her lungs.  For your asthma continue the Spiriva  once daily and the Advair twice a day.  Gargle after the Advair.  Continue the albuterol  inhaler as needed.  For your allergies continue the allergy shots, the Flonase , the antihistamine, and Singulair .

## 2023-12-28 NOTE — Progress Notes (Signed)
 Austin Faulkner    [id number]    [birth year]  Primary Care Physician:O'Sullivan, Eleanor, NP Date of Appointment: 12/28/2023 Established Patient Visit  Chief complaint:   Chief Complaint  Patient presents with   Asthma   Shortness of Breath    Weather and after taking allergy shot. Pft result      HPI: Austin Faulkner is a 68 y.o. man, veteran, who presented with abnormal ct chest and asthma. Seronegative Rheumatoid arthritis on adalimumab .  Interval Updates: Here for follow up.   1 asthma exacerbation in winter 2024 due to URI. Has to rest due to knee pain not due to breathing. Cannot walk more than 50 feet.   Has joint pains in hands but his RA symptoms are much better on humira.   Regarding his asthma,  Sees Dr Iva at [facility]. Getting allergy shots.  Minimal albuterol  use. Uses a neb very rarely.   Current Regimen:advair 115 2 puffs bid with spacer, spiriva , prn albuterol  Asthma Triggers: allergies, URI Exacerbations in the last year: one about 6 months ago,  History of hospitalization or intubation: never Allergy Testing/rhinitis: yes on allergy shots, singulair  GERD: yes on prilosec ACT:  Asthma Control Test ACT Total Score  12/28/2023  8:49 AM 20   FeNO: 2 ppb 7/31.  Serum Eos/IgE:    I have reviewed the patient's family social and past medical history and updated as appropriate.   Past Medical History:  Diagnosis Date   Asthma    COPD (chronic obstructive pulmonary disease) (HCC)    Depression    PTSD   Diverticulitis    Diverticulitis of colon with perforation 03/04/2016   Dyspnea    secondary to seasonal allergies; or illness   GERD (gastroesophageal reflux disease)    Heart murmur    slight since birth   Hypertension    Pneumonia yrs ago   Rheumatoid arthritis (HCC)    Seasonal allergies    Shoulder dislocation     Past Surgical History:  Procedure Laterality Date   CARPAL TUNNEL RELEASE Right    INGUINAL  HERNIA REPAIR Bilateral 07/23/2021   Procedure: LAPAROSCOPIC BILATERAL INGUINAL HERNIA REPAIR WITH MESH;  Surgeon: Kinsinger, Herlene Righter, MD;  Location: [facility];  Service: General;  Laterality: Bilateral;   knee arthroscopy and cartlidge repair Bilateral    5 on right and 5 on left   NECK SURGERY  2022   A,C,D,F C4/5   SHOULDER ARTHROSCOPY Left 2008   SMALL INTESTINE SURGERY  2017   small intestine resection   TOTAL KNEE ARTHROPLASTY Bilateral 2002   2010    Family History  Problem Relation Age of Onset   Lung cancer Mother    Heart attack Father 82       healthier than I am   Lupus Sister    Multiple myeloma Sister        BMT at [facility]   Stroke Paternal Grandmother    Alpha-1 antitrypsin deficiency Neg Hx     Social History   Occupational History   Occupation: retired  Tobacco Use   Smoking status: Never    Passive exposure: Yes   Smokeless tobacco: Never  Vaping Use   Vaping status: Never Used  Substance and Sexual Activity   Alcohol use: No   Drug use: No   Sexual activity: Yes    Partners: Female     Physical Exam: Blood pressure 132/88, pulse (!) 56, temperature 97.8  F (36.6 C), temperature source Oral, height 5' 10 (1.778 m), weight 168 lb (76.2 kg), SpO2 100%.  Gen:      No distress Lungs:   ctab no wheezes or crackles CV:        RRR Ext: ulnar deviation and swan hand deformities   Data Reviewed: Imaging: I have personally reviewed the CT Chest which shows mild emphysema   PFTs: Normal spirometry march 2025.   Labs: Lab Results  Component Value Date   WBC 5.2 11/15/2023   HGB 13.3 11/15/2023   HCT 40.9 11/15/2023   MCV 94.5 11/15/2023   PLT 276 11/15/2023   Lab Results  Component Value Date   NA 140 11/15/2023   K 3.8 11/15/2023   CO2 32 11/15/2023   GLUCOSE 88 11/15/2023   BUN 18 11/15/2023   CREATININE 1.01 11/15/2023   CALCIUM 9.1 11/15/2023   GFR 77.98 11/15/2023   GFRNONAA >60 07/13/2021    Immunization  status: Immunization History  Administered Date(s) Administered   Fluad Trivalent(High Dose 65+) 05/16/2023   Influenza Split 02/14/2012   Influenza Whole 04/07/2007   Influenza, Seasonal, Injecte, Preservative Fre 04/06/2010, 03/30/2011   Influenza,inj,Quad PF,6+ Mos 03/09/2018, 02/23/2019, 04/21/2021   Influenza-Unspecified 03/14/2002, 06/17/2003, 03/16/2004, 04/07/2007, 01/23/2012, 05/29/2014, 03/31/2015, 04/21/2021   Moderna Covid-19 Fall Seasonal Vaccine 52yrs & older 06/27/2023   Moderna Covid-19 Vaccine Bivalent Booster 75yrs & up 03/31/2021   Moderna SARS-COV2 Booster Vaccination 06/27/2023   Moderna Sars-Covid-2 Vaccination 08/06/2019, 09/03/2019, 09/19/2020   PFIZER(Purple Top)SARS-COV-2 Vaccination 08/06/2019, 09/03/2019   Pneumococcal Polysaccharide-23 04/07/2007, 01/09/2020   Td 05/24/1996   Tdap 01/22/2010, 03/30/2011, 11/02/2021    External Records Personally Reviewed: internal medicine, general surgery  Assessment:  Moderate persistent asthma, with possible COPD overlap syndrome.  Rheumatoid Arthritis, seronegative Restriction to ventilation on pfts Environmental allergies  Plan/Recommendations:  Your breathing testing showed normal exhalation phase of your breathing but some difficulty taking a deep breath then.  I am glad you are feeling well, however because of your rheumatoid arthritis I like to follow this up with a CT scan of your chest.  It was completely normal in 2023 so I am hoping it will be normal again today.  It is better to be safe than sorry because rheumatoid arthritis can also affect her lungs.  For your asthma continue the Spiriva  once daily and the Advair twice a day.  Gargle after the Advair.  Continue the albuterol  inhaler as needed.  For your allergies continue the allergy shots, the Flonase , the antihistamine, and Singulair .  Return to Care: Return in about 1 year (around 12/27/2024).   Verdon Gore, MD Pulmonary and Critical Care  Medicine [facility] [phone]

## 2024-01-06 ENCOUNTER — Other Ambulatory Visit (HOSPITAL_BASED_OUTPATIENT_CLINIC_OR_DEPARTMENT_OTHER)

## 2024-01-09 ENCOUNTER — Telehealth: Payer: Self-pay | Admitting: Internal Medicine

## 2024-01-09 NOTE — Telephone Encounter (Unsigned)
 Copied from CRM #[id number]. Topic: Referral - Status >> Jan 09, 2024  2:53 PM Joesph PARAS wrote: Reason for CRM: Siani is calling to inquire about status of sent referral from [facility], Informed one has not been received, states will re-fax.

## 2024-01-10 ENCOUNTER — Ambulatory Visit: Admitting: Psychology

## 2024-01-10 NOTE — Telephone Encounter (Signed)
 [facility] authorization received yesterday starting 12/25/2023 which will cover OV on 12/28/2023 and CT tomorrow- patient is aware. Nothing further needed

## 2024-01-11 ENCOUNTER — Ambulatory Visit (HOSPITAL_BASED_OUTPATIENT_CLINIC_OR_DEPARTMENT_OTHER)
Admission: RE | Admit: 2024-01-11 | Discharge: 2024-01-11 | Disposition: A | Discharge status: Home / Self Care | Source: Ambulatory Visit | Attending: Internal Medicine | Admitting: Internal Medicine

## 2024-01-11 DIAGNOSIS — M06041 Rheumatoid arthritis without rheumatoid factor, right hand: Secondary | ICD-10-CM | POA: Insufficient documentation

## 2024-01-11 DIAGNOSIS — M06042 Rheumatoid arthritis without rheumatoid factor, left hand: Secondary | ICD-10-CM | POA: Diagnosis present

## 2024-01-11 DIAGNOSIS — J4530 Mild persistent asthma, uncomplicated: Secondary | ICD-10-CM | POA: Insufficient documentation

## 2024-01-11 DIAGNOSIS — R0602 Shortness of breath: Secondary | ICD-10-CM | POA: Insufficient documentation

## 2024-01-16 ENCOUNTER — Ambulatory Visit: Payer: Self-pay | Admitting: Internal Medicine

## 2024-01-16 ENCOUNTER — Ambulatory Visit (INDEPENDENT_AMBULATORY_CARE_PROVIDER_SITE_OTHER)

## 2024-01-16 DIAGNOSIS — J309 Allergic rhinitis, unspecified: Secondary | ICD-10-CM

## 2024-02-16 ENCOUNTER — Ambulatory Visit (INDEPENDENT_AMBULATORY_CARE_PROVIDER_SITE_OTHER)

## 2024-02-16 DIAGNOSIS — J309 Allergic rhinitis, unspecified: Secondary | ICD-10-CM | POA: Diagnosis not present

## 2024-02-21 ENCOUNTER — Ambulatory Visit: Admitting: Psychology

## 2024-03-23 ENCOUNTER — Ambulatory Visit (INDEPENDENT_AMBULATORY_CARE_PROVIDER_SITE_OTHER): Admitting: *Deleted

## 2024-03-23 DIAGNOSIS — J309 Allergic rhinitis, unspecified: Secondary | ICD-10-CM | POA: Diagnosis not present

## 2024-04-06 NOTE — Patient Instructions (Addendum)
 1. Seasonal and perennial allergic rhinitis - Let us  know earlier  if you have a reaction with your allergy injections - Continue epinephrine  rinses to your vials to decrease the itching and swelling around the injection sites.  - You can increase the cetirizine  to twice daily during the worst times of the year.  As this may make you sleepy. - Continue with the Flonase  daily. - Call or message us  with the name of the other nasal sprays that you are using   2. Moderate persistent asthma, uncomplicated -Your breathing test looks good today - Daily controller medication(s): Advair 115/21mcg two puffs twice daily with spacer - Prior to physical activity: albuterol  2 puffs 10-15 minutes before physical activity. - Rescue medications: albuterol  4 puffs every 4-6 hours as needed - Asthma control goals:  * Full participation in all desired activities (may need albuterol  before activity) * Albuterol  use two time or less a week on average (not counting use with activity) * Cough interfering with sleep two time or less a month * Oral steroids no more than once a year * No hospitalizations  3.Follow up in 3-4 months or sooner if needed   Please inform us  of any Emergency Department visits, hospitalizations, or changes in symptoms. Call us  before going to the ED for breathing or allergy symptoms since we might be able to fit you in for a sick visit. Feel free to contact us  anytime with any questions, problems, or concerns.  It was a pleasure to meet today!  Websites that have reliable patient information: 1. American Academy of Asthma, Allergy, and Immunology: www.aaaai.org 2. Food Allergy Research and Education (FARE): foodallergy.org 3. Mothers of Asthmatics: http://www.asthmacommunitynetwork.org 4. American College of Allergy, Asthma, and Immunology: www.acaai.org

## 2024-04-09 ENCOUNTER — Ambulatory Visit: Admitting: Family

## 2024-04-09 ENCOUNTER — Encounter: Payer: Self-pay | Admitting: Family

## 2024-04-09 VITALS — BP 110/62 | HR 82

## 2024-04-09 DIAGNOSIS — J302 Other seasonal allergic rhinitis: Secondary | ICD-10-CM | POA: Diagnosis not present

## 2024-04-09 DIAGNOSIS — J3089 Other allergic rhinitis: Secondary | ICD-10-CM

## 2024-04-09 DIAGNOSIS — J309 Allergic rhinitis, unspecified: Secondary | ICD-10-CM

## 2024-04-09 DIAGNOSIS — J4540 Moderate persistent asthma, uncomplicated: Secondary | ICD-10-CM

## 2024-04-09 NOTE — Progress Notes (Unsigned)
 [address] [place] [address] Dept: [phone]  FOLLOW UP NOTE  Patient ID: Austin Faulkner, male    DOB: 1956/02/27  Age: 68 y.o. MRN: [id number] Date of Office Visit: 04/09/2024  Assessment  Chief Complaint: No chief complaint on file.  HPI Austin Faulkner is a 68 year old male who presents today for follow-up of moderate persistent asthma and seasonal and perennial allergic rhinitis.  He was last seen on August 18, 2023 by Dr. Iva.  Since his last office visit he got his shingles vaccine 3 weeks ago and had carpal tunnel surgery on his right hand 3 months ago.  He had an echocardiogram today and is scheduled for a stress test.  Moderate persistent asthma: He continues to take Advair 115/21 mcg 2 puffs twice a day and has albuterol  to use as needed.  He reports that the allergy injection prior to his October 31,2025 allergy injection caused his arm to swell up and his asthma to flare.  He put Benadryl  cream on his arm.  His shortness of breath occurred later on that evening, approximately 3 hours after his allergy injection.  His shortness of breath got worse and he had not been out in the yard.  He used his albuterol  and did not really work.  He did a breathing treatment around 6 PM gave him 3 hours relief around 12 AM he took another breathing treatment and slept.  Around 9 AM he woke up and had to do another treatment and then everything was fine.  Just he did tell the allergist at the [facility] and was instructed that he should have contacted our office.  Otherwise he denies cough, wheeze, tightness in chest, shortness of breath, and nocturnal awakenings due to breathing problems.  He hardly has to use his albuterol  otherwise.  He does report that he has 2 pulmonologist 1 at the [facility] and 1 outside the [facility].  Since his last office visit he has not required any systemic steroids or made any trips to the emergency room or urgent care due to breathing problems.  Seasonal and perennial  allergic rhinitis: He continues to receive allergy injections per protocol and reports that he does feel like they have helped.  He currently takes Zyrtec  10 mg once a day and uses Flonase  nasal spray 2 sprays each nostril once a day.  He has 2 other nasal sprays that he does not know the name of.  He usually enjoys working out in the yard.  The past 3 weeks his allergies have been worse.  He reports clear rhinorrhea and some nasal congestion.  He has not been treated for any sinus infections since we last saw him.   Drug Allergies:  Allergies  Allergen Reactions   Azithromycin Other (See Comments)    Patient reports does not help with infections.    Sildenafil Other (See Comments)    Other reaction(s): Headache, Visual disturbance, Other (See Comments)  Viagra, doesn't help  Other reaction(s): Headache, Pain in eye  Other Reaction(s): Other (See Comments)   Tape Rash    Must use paper tape  Prefers paper tape, others bruise skin    Review of Systems: Negative except as per HPI  Physical Exam: BP 110/62   Pulse 82    Physical Exam Constitutional:      Appearance: Normal appearance.  HENT:     Head: Normocephalic and atraumatic.     Comments: Pharynx normal, eyes normal, ears normal, nose: Bilateral lower turbinates  mildly edematous with no drainage noted    Right Ear: Tympanic membrane, ear canal and external ear normal.     Left Ear: Tympanic membrane, ear canal and external ear normal.     Mouth/Throat:     Mouth: Mucous membranes are moist.     Pharynx: Oropharynx is clear.  Eyes:     Conjunctiva/sclera: Conjunctivae normal.  Cardiovascular:     Rate and Rhythm: Regular rhythm.     Heart sounds: Normal heart sounds.  Pulmonary:     Effort: Pulmonary effort is normal.     Breath sounds: Normal breath sounds.     Comments: Lungs clear to auscultation Musculoskeletal:     Cervical back: Neck supple.  Skin:    General: Skin is warm.  Neurological:     Mental  Status: He is alert and oriented to person, place, and time.  Psychiatric:        Mood and Affect: Mood normal.        Behavior: Behavior normal.        Thought Content: Thought content normal.        Judgment: Judgment normal.     Diagnostics: FVC 2.79 L (86%), FEV1 2.24 L (88%), FEV1/FVC 0.80.  Spirometry indicates normal spirometry.  Assessment and Plan: 1. Seasonal and perennial allergic rhinitis   2. Moderate persistent asthma, uncomplicated     No orders of the defined types were placed in this encounter.   Patient Instructions  1. Seasonal and perennial allergic rhinitis - Let us  know earlier  if you have a reaction with your allergy injections - Continue epinephrine  rinses to your vials to decrease the itching and swelling around the injection sites.  - You can increase the cetirizine  to twice daily during the worst times of the year.  As this may make you sleepy. - Continue with the Flonase  daily. - Call or message us  with the name of the other nasal sprays that you are using   2. Moderate persistent asthma, uncomplicated -Your breathing test looks good today - Daily controller medication(s): Advair 115/21mcg two puffs twice daily with spacer - Prior to physical activity: albuterol  2 puffs 10-15 minutes before physical activity. - Rescue medications: albuterol  4 puffs every 4-6 hours as needed - Asthma control goals:  * Full participation in all desired activities (may need albuterol  before activity) * Albuterol  use two time or less a week on average (not counting use with activity) * Cough interfering with sleep two time or less a month * Oral steroids no more than once a year * No hospitalizations  3.Follow up in 3-4 months or sooner if needed   Please inform us  of any Emergency Department visits, hospitalizations, or changes in symptoms. Call us  before going to the ED for breathing or allergy symptoms since we might be able to fit you in for a sick visit. Feel  free to contact us  anytime with any questions, problems, or concerns.  It was a pleasure to meet today!  Websites that have reliable patient information: 1. American Academy of Asthma, Allergy, and Immunology: www.aaaai.org 2. Food Allergy Research and Education (FARE): foodallergy.org 3. Mothers of Asthmatics: http://www.asthmacommunitynetwork.org 4. American College of Allergy, Asthma, and Immunology: www.acaai.org           Return in about 3 months (around 07/10/2024), or if symptoms worsen or fail to improve.    Thank you for the opportunity to care for this patient.  Please do not hesitate to contact me with questions.  Wanda Craze, FNP [facility] 

## 2024-04-10 ENCOUNTER — Telehealth: Payer: Self-pay

## 2024-04-10 ENCOUNTER — Encounter: Payer: Self-pay | Admitting: Family

## 2024-04-10 NOTE — Telephone Encounter (Signed)
 Thank you for the update!

## 2024-04-10 NOTE — Telephone Encounter (Signed)
 Pt called to let Wanda Craze, FNP that he is taking azelastine nasal spray 0.1 %, 1-2 sprays per nostril up to 2x/day.

## 2024-04-17 ENCOUNTER — Ambulatory Visit (INDEPENDENT_AMBULATORY_CARE_PROVIDER_SITE_OTHER)

## 2024-04-17 DIAGNOSIS — J302 Other seasonal allergic rhinitis: Secondary | ICD-10-CM | POA: Diagnosis not present

## 2024-04-17 DIAGNOSIS — J3089 Other allergic rhinitis: Secondary | ICD-10-CM | POA: Diagnosis not present

## 2024-04-26 ENCOUNTER — Ambulatory Visit

## 2024-04-26 DIAGNOSIS — J3089 Other allergic rhinitis: Secondary | ICD-10-CM | POA: Diagnosis not present

## 2024-04-26 DIAGNOSIS — J302 Other seasonal allergic rhinitis: Secondary | ICD-10-CM | POA: Diagnosis not present

## 2024-04-30 ENCOUNTER — Telehealth: Payer: Self-pay | Admitting: Family

## 2024-04-30 NOTE — Telephone Encounter (Signed)
 Copied from CRM [id number]. Topic: Medicare AWV >> Apr 30, 2024  9:20 AM Nathanel DEL wrote: Called LVM 04/30/2024 to sched AWV. Please schedule in office or virtual visit.   Nathanel Paschal; Care Guide Ambulatory Clinical Support [place] [facility] Direct Dial: [phone]

## 2024-05-03 ENCOUNTER — Ambulatory Visit

## 2024-05-03 DIAGNOSIS — J309 Allergic rhinitis, unspecified: Secondary | ICD-10-CM

## 2024-05-14 ENCOUNTER — Ambulatory Visit

## 2024-05-14 DIAGNOSIS — J309 Allergic rhinitis, unspecified: Secondary | ICD-10-CM

## 2024-05-14 DIAGNOSIS — J3089 Other allergic rhinitis: Secondary | ICD-10-CM

## 2024-05-25 ENCOUNTER — Ambulatory Visit

## 2024-06-12 ENCOUNTER — Ambulatory Visit

## 2024-06-12 DIAGNOSIS — J302 Other seasonal allergic rhinitis: Secondary | ICD-10-CM | POA: Diagnosis not present

## 2024-08-16 ENCOUNTER — Ambulatory Visit: Admitting: Allergy & Immunology
